# Patient Record
Sex: Female | Born: 1940
Health system: Southern US, Community
[De-identification: ages and names within clinical notes are randomized; demographics above are authoritative.]

## PROBLEM LIST (undated history)

## (undated) ENCOUNTER — Emergency Department (HOSPITAL_COMMUNITY)

## (undated) DIAGNOSIS — I10 Essential (primary) hypertension: Secondary | ICD-10-CM

## (undated) DIAGNOSIS — E1169 Type 2 diabetes mellitus with other specified complication: Secondary | ICD-10-CM

## (undated) DIAGNOSIS — F411 Generalized anxiety disorder: Secondary | ICD-10-CM

## (undated) DIAGNOSIS — K219 Gastro-esophageal reflux disease without esophagitis: Secondary | ICD-10-CM

## (undated) DIAGNOSIS — M1990 Unspecified osteoarthritis, unspecified site: Secondary | ICD-10-CM

## (undated) DIAGNOSIS — E785 Hyperlipidemia, unspecified: Secondary | ICD-10-CM

## (undated) DIAGNOSIS — E119 Type 2 diabetes mellitus without complications: Secondary | ICD-10-CM

## (undated) HISTORY — PX: TOTAL ABDOMINAL HYSTERECTOMY W/ BILATERAL SALPINGOOPHORECTOMY: SHX83

## (undated) HISTORY — DX: Generalized anxiety disorder: F41.1

## (undated) HISTORY — DX: Unspecified osteoarthritis, unspecified site: M19.90

## (undated) HISTORY — PX: APPENDECTOMY: SHX54

## (undated) HISTORY — DX: Type 2 diabetes mellitus with other specified complication: E11.69

## (undated) HISTORY — DX: Hyperlipidemia, unspecified: E78.5

## (undated) HISTORY — DX: Essential (primary) hypertension: I10

## (undated) HISTORY — PX: ABDOMINAL HYSTERECTOMY: SHX81

## (undated) HISTORY — DX: Gastro-esophageal reflux disease without esophagitis: K21.9

---

## 2009-05-27 ENCOUNTER — Inpatient Hospital Stay (HOSPITAL_COMMUNITY): Admission: AD | Admit: 2009-05-27 | Discharge: 2009-05-29 | Payer: Self-pay | Admitting: Orthopedic Surgery

## 2009-05-27 IMAGING — RF DG ANKLE 2V *R*
1 series · 4 of 4 positions shown · non-contrast
Comparison: None.

CLINICAL DATA: Right ankle fracture

RIGHT ANKLE - 2 VIEW

[Series 1: run · 4 of 4 slices shown]
[im 1/4]
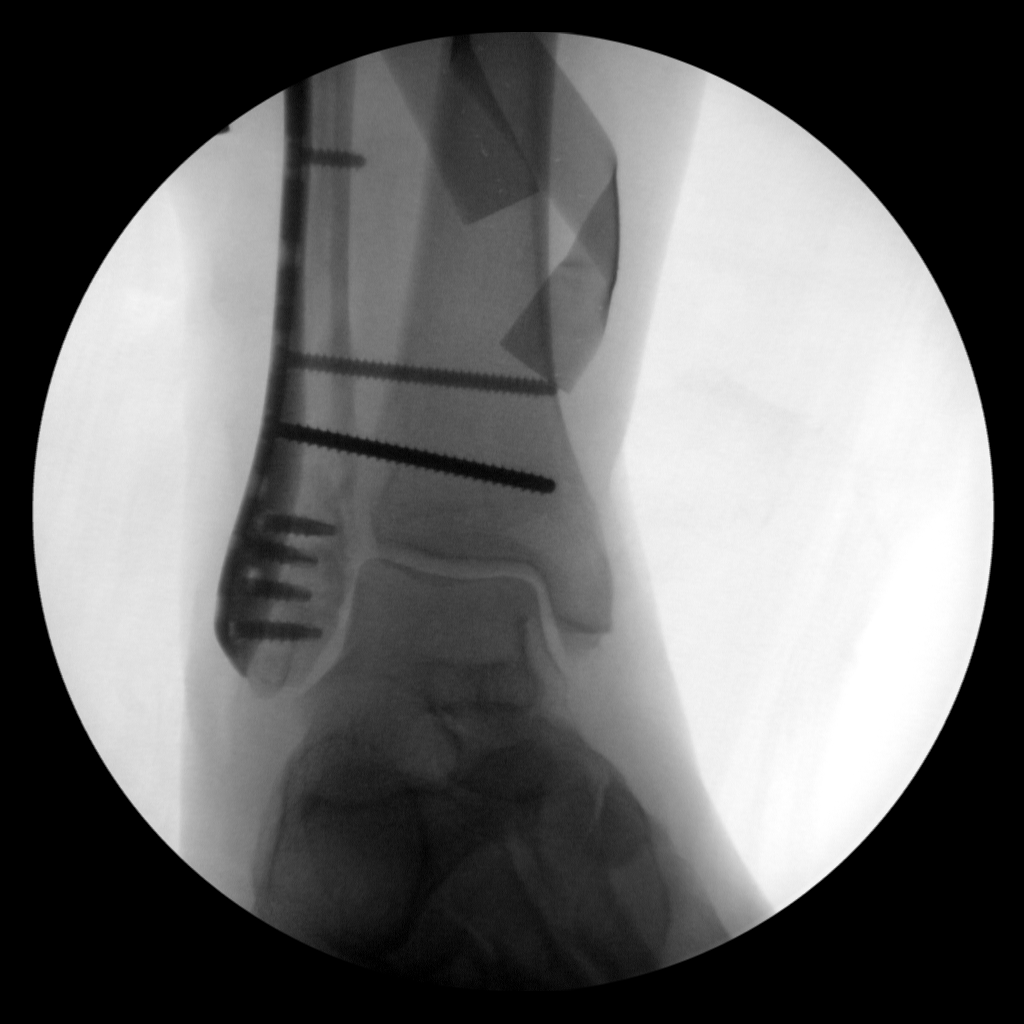
[im 2/4]
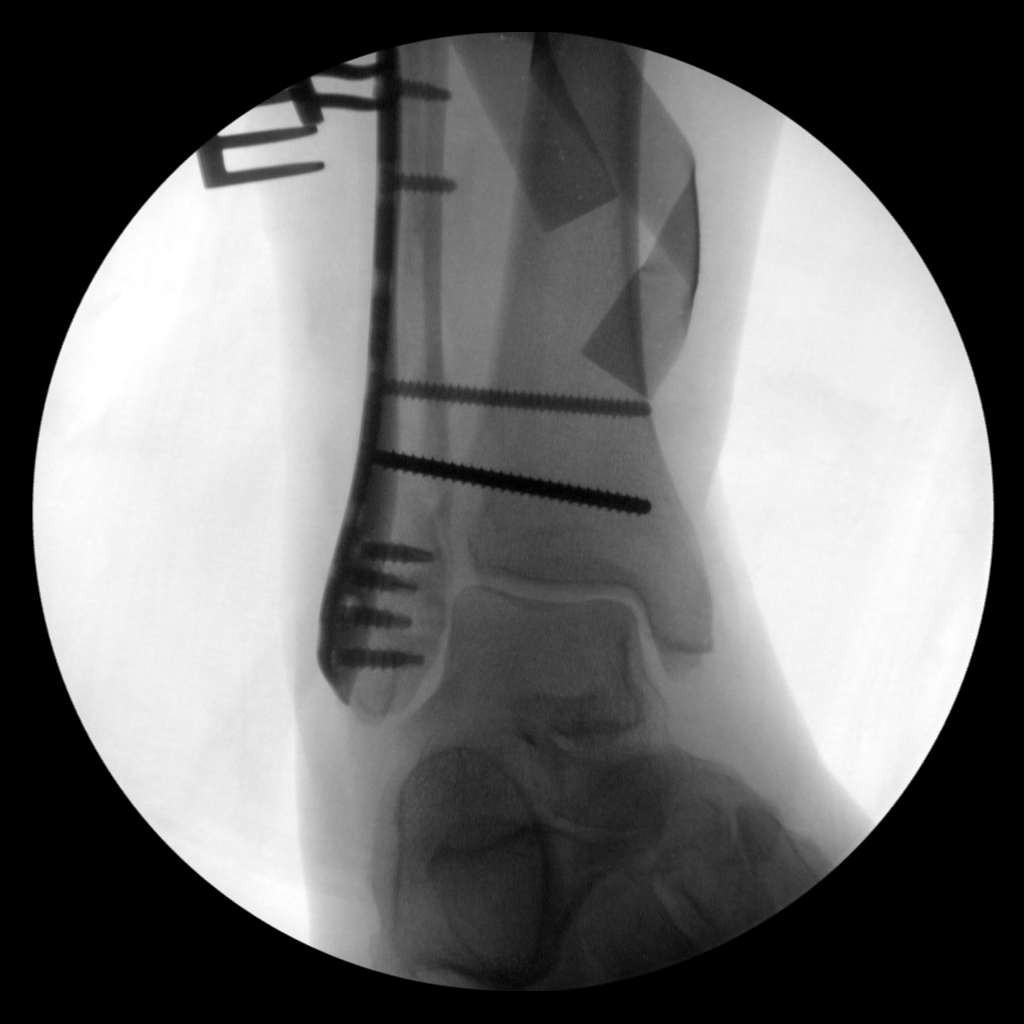
[im 3/4]
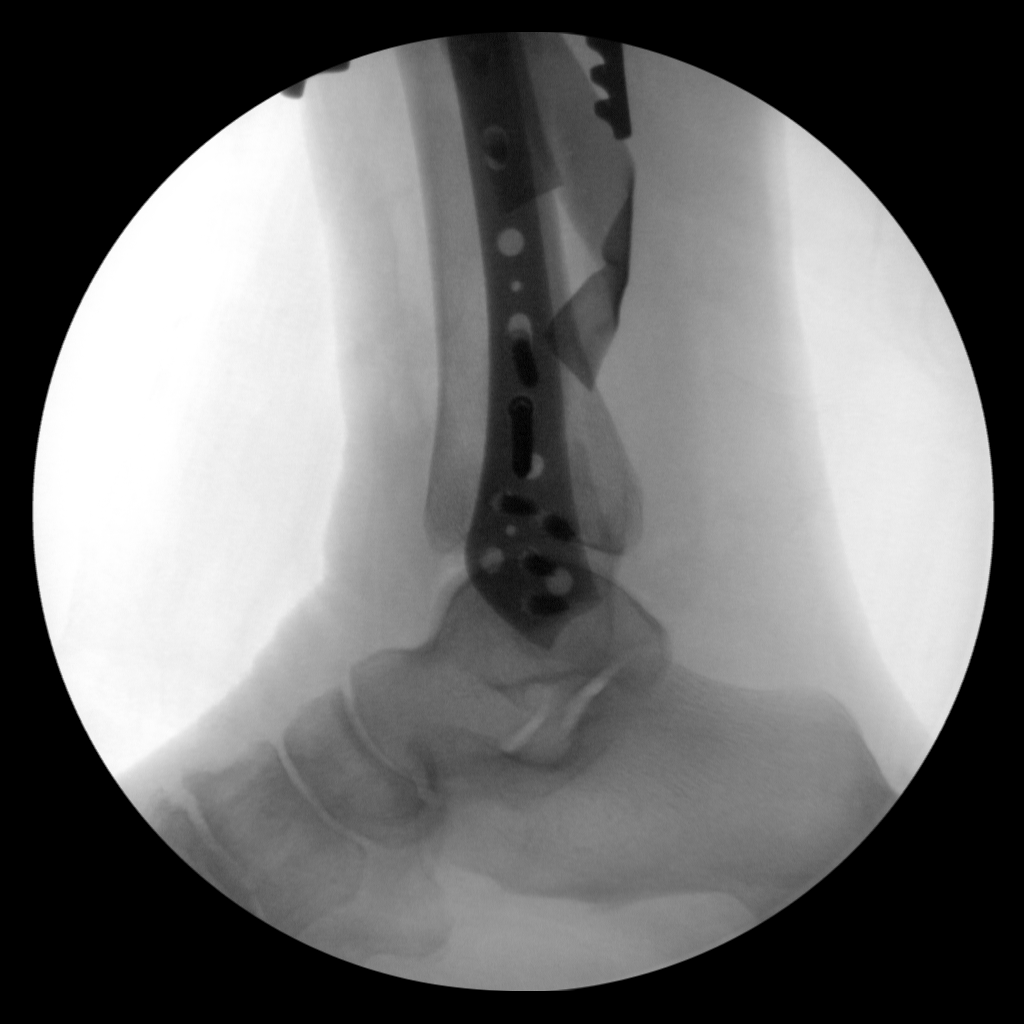
[im 4/4]
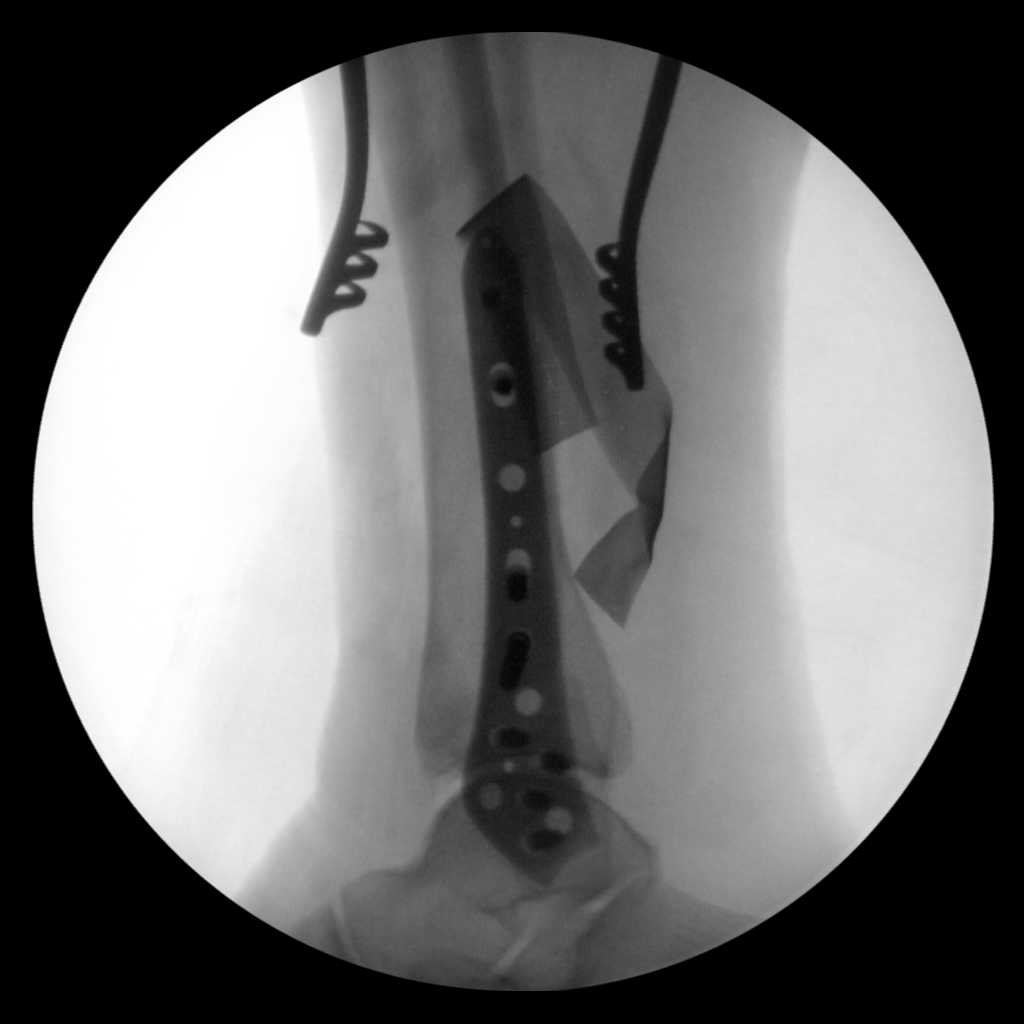

[4 of 4 positions shown; findings below may reference images not displayed]

FINDINGS: Four intraoperative spot fluoro images are submitted
after the procedure.  The patient has had lateral plate and screw
fixation of the distal fibula with two screws traversing the
syndesmosis.  Alignment the ankle mortise appears preserved.
Surgical drain and soft tissue retractors are noted.
IMPRESSION: Intraoperative evaluation during ORIF for ankle fracture.

## 2010-08-31 LAB — URINALYSIS, ROUTINE W REFLEX MICROSCOPIC
Bilirubin Urine: NEGATIVE
Ketones, ur: NEGATIVE mg/dL
Nitrite: NEGATIVE
Protein, ur: NEGATIVE mg/dL

## 2010-08-31 LAB — COMPREHENSIVE METABOLIC PANEL
Albumin: 4 g/dL (ref 3.5–5.2)
Alkaline Phosphatase: 63 U/L (ref 39–117)
BUN: 22 mg/dL (ref 6–23)
Chloride: 104 mEq/L (ref 96–112)
Creatinine, Ser: 0.56 mg/dL (ref 0.4–1.2)
Glucose, Bld: 131 mg/dL — ABNORMAL HIGH (ref 70–99)
Total Bilirubin: 0.4 mg/dL (ref 0.3–1.2)

## 2010-08-31 LAB — URINE MICROSCOPIC-ADD ON

## 2010-08-31 LAB — PROTIME-INR
INR: 1.13 (ref 0.00–1.49)
Prothrombin Time: 14.4 seconds (ref 11.6–15.2)

## 2010-08-31 LAB — CBC
HCT: 39.8 % (ref 36.0–46.0)
Hemoglobin: 13.3 g/dL (ref 12.0–15.0)
MCHC: 33.5 g/dL (ref 30.0–36.0)
RBC: 4.25 MIL/uL (ref 3.87–5.11)
RDW: 14 % (ref 11.5–15.5)

## 2010-08-31 LAB — DIFFERENTIAL
Basophils Absolute: 0 10*3/uL (ref 0.0–0.1)
Basophils Relative: 0 % (ref 0–1)
Eosinophils Absolute: 0.2 10*3/uL (ref 0.0–0.7)
Monocytes Relative: 7 % (ref 3–12)
Neutro Abs: 4.9 10*3/uL (ref 1.7–7.7)
Neutrophils Relative %: 58 % (ref 43–77)

## 2010-08-31 LAB — APTT: aPTT: 31 seconds (ref 24–37)

## 2015-06-03 DIAGNOSIS — H40003 Preglaucoma, unspecified, bilateral: Secondary | ICD-10-CM | POA: Diagnosis not present

## 2015-06-03 DIAGNOSIS — E119 Type 2 diabetes mellitus without complications: Secondary | ICD-10-CM | POA: Diagnosis not present

## 2015-06-03 DIAGNOSIS — H2513 Age-related nuclear cataract, bilateral: Secondary | ICD-10-CM | POA: Diagnosis not present

## 2015-06-13 DIAGNOSIS — E1165 Type 2 diabetes mellitus with hyperglycemia: Secondary | ICD-10-CM | POA: Diagnosis not present

## 2015-07-04 DIAGNOSIS — E1165 Type 2 diabetes mellitus with hyperglycemia: Secondary | ICD-10-CM | POA: Diagnosis not present

## 2015-07-08 DIAGNOSIS — Z1231 Encounter for screening mammogram for malignant neoplasm of breast: Secondary | ICD-10-CM | POA: Diagnosis not present

## 2015-07-10 DIAGNOSIS — E782 Mixed hyperlipidemia: Secondary | ICD-10-CM | POA: Diagnosis not present

## 2015-07-10 DIAGNOSIS — Z79899 Other long term (current) drug therapy: Secondary | ICD-10-CM | POA: Diagnosis not present

## 2015-07-16 DIAGNOSIS — Z1389 Encounter for screening for other disorder: Secondary | ICD-10-CM | POA: Diagnosis not present

## 2015-07-16 DIAGNOSIS — E782 Mixed hyperlipidemia: Secondary | ICD-10-CM | POA: Diagnosis not present

## 2015-07-16 DIAGNOSIS — I10 Essential (primary) hypertension: Secondary | ICD-10-CM | POA: Diagnosis not present

## 2015-07-16 DIAGNOSIS — E1165 Type 2 diabetes mellitus with hyperglycemia: Secondary | ICD-10-CM | POA: Diagnosis not present

## 2015-08-05 DIAGNOSIS — R52 Pain, unspecified: Secondary | ICD-10-CM | POA: Diagnosis not present

## 2015-08-05 DIAGNOSIS — Z1389 Encounter for screening for other disorder: Secondary | ICD-10-CM | POA: Diagnosis not present

## 2015-08-05 DIAGNOSIS — S0093XA Contusion of unspecified part of head, initial encounter: Secondary | ICD-10-CM | POA: Diagnosis not present

## 2015-08-11 DIAGNOSIS — W19XXXA Unspecified fall, initial encounter: Secondary | ICD-10-CM | POA: Diagnosis not present

## 2015-08-11 DIAGNOSIS — R42 Dizziness and giddiness: Secondary | ICD-10-CM | POA: Diagnosis not present

## 2015-08-11 DIAGNOSIS — R220 Localized swelling, mass and lump, head: Secondary | ICD-10-CM | POA: Diagnosis not present

## 2015-08-11 DIAGNOSIS — S0093XA Contusion of unspecified part of head, initial encounter: Secondary | ICD-10-CM | POA: Diagnosis not present

## 2015-12-09 DIAGNOSIS — H40003 Preglaucoma, unspecified, bilateral: Secondary | ICD-10-CM | POA: Diagnosis not present

## 2015-12-09 DIAGNOSIS — H2513 Age-related nuclear cataract, bilateral: Secondary | ICD-10-CM | POA: Diagnosis not present

## 2015-12-09 DIAGNOSIS — E119 Type 2 diabetes mellitus without complications: Secondary | ICD-10-CM | POA: Diagnosis not present

## 2015-12-16 DIAGNOSIS — C44519 Basal cell carcinoma of skin of other part of trunk: Secondary | ICD-10-CM | POA: Diagnosis not present

## 2015-12-16 DIAGNOSIS — E1165 Type 2 diabetes mellitus with hyperglycemia: Secondary | ICD-10-CM | POA: Diagnosis not present

## 2015-12-16 DIAGNOSIS — D485 Neoplasm of uncertain behavior of skin: Secondary | ICD-10-CM | POA: Diagnosis not present

## 2015-12-16 DIAGNOSIS — L820 Inflamed seborrheic keratosis: Secondary | ICD-10-CM | POA: Diagnosis not present

## 2015-12-17 DIAGNOSIS — F411 Generalized anxiety disorder: Secondary | ICD-10-CM | POA: Diagnosis not present

## 2015-12-17 DIAGNOSIS — Z1389 Encounter for screening for other disorder: Secondary | ICD-10-CM | POA: Diagnosis not present

## 2015-12-17 DIAGNOSIS — E782 Mixed hyperlipidemia: Secondary | ICD-10-CM | POA: Diagnosis not present

## 2015-12-17 DIAGNOSIS — I10 Essential (primary) hypertension: Secondary | ICD-10-CM | POA: Diagnosis not present

## 2015-12-17 DIAGNOSIS — E119 Type 2 diabetes mellitus without complications: Secondary | ICD-10-CM | POA: Diagnosis not present

## 2016-02-25 DIAGNOSIS — F411 Generalized anxiety disorder: Secondary | ICD-10-CM | POA: Diagnosis not present

## 2016-02-25 DIAGNOSIS — E782 Mixed hyperlipidemia: Secondary | ICD-10-CM | POA: Diagnosis not present

## 2016-02-25 DIAGNOSIS — I10 Essential (primary) hypertension: Secondary | ICD-10-CM | POA: Diagnosis not present

## 2016-02-25 DIAGNOSIS — Z0000 Encounter for general adult medical examination without abnormal findings: Secondary | ICD-10-CM | POA: Diagnosis not present

## 2016-02-25 DIAGNOSIS — E1165 Type 2 diabetes mellitus with hyperglycemia: Secondary | ICD-10-CM | POA: Diagnosis not present

## 2016-02-25 DIAGNOSIS — Z9181 History of falling: Secondary | ICD-10-CM | POA: Diagnosis not present

## 2016-02-25 DIAGNOSIS — Z23 Encounter for immunization: Secondary | ICD-10-CM | POA: Diagnosis not present

## 2016-03-30 DIAGNOSIS — R300 Dysuria: Secondary | ICD-10-CM | POA: Diagnosis not present

## 2016-06-07 DIAGNOSIS — E119 Type 2 diabetes mellitus without complications: Secondary | ICD-10-CM | POA: Diagnosis not present

## 2016-06-07 DIAGNOSIS — H2513 Age-related nuclear cataract, bilateral: Secondary | ICD-10-CM | POA: Diagnosis not present

## 2016-07-01 DIAGNOSIS — C44529 Squamous cell carcinoma of skin of other part of trunk: Secondary | ICD-10-CM | POA: Diagnosis not present

## 2016-07-01 DIAGNOSIS — L570 Actinic keratosis: Secondary | ICD-10-CM | POA: Diagnosis not present

## 2016-07-16 DIAGNOSIS — Z1231 Encounter for screening mammogram for malignant neoplasm of breast: Secondary | ICD-10-CM | POA: Diagnosis not present

## 2016-08-19 DIAGNOSIS — E782 Mixed hyperlipidemia: Secondary | ICD-10-CM | POA: Diagnosis not present

## 2016-08-19 DIAGNOSIS — Z6830 Body mass index (BMI) 30.0-30.9, adult: Secondary | ICD-10-CM | POA: Diagnosis not present

## 2016-08-19 DIAGNOSIS — Z79899 Other long term (current) drug therapy: Secondary | ICD-10-CM | POA: Diagnosis not present

## 2016-08-19 DIAGNOSIS — I10 Essential (primary) hypertension: Secondary | ICD-10-CM | POA: Diagnosis not present

## 2016-08-19 DIAGNOSIS — E1165 Type 2 diabetes mellitus with hyperglycemia: Secondary | ICD-10-CM | POA: Diagnosis not present

## 2016-12-14 DIAGNOSIS — C44519 Basal cell carcinoma of skin of other part of trunk: Secondary | ICD-10-CM | POA: Diagnosis not present

## 2016-12-14 DIAGNOSIS — L820 Inflamed seborrheic keratosis: Secondary | ICD-10-CM | POA: Diagnosis not present

## 2016-12-14 DIAGNOSIS — D225 Melanocytic nevi of trunk: Secondary | ICD-10-CM | POA: Diagnosis not present

## 2017-03-01 DIAGNOSIS — Z9181 History of falling: Secondary | ICD-10-CM | POA: Diagnosis not present

## 2017-03-01 DIAGNOSIS — Z79899 Other long term (current) drug therapy: Secondary | ICD-10-CM | POA: Diagnosis not present

## 2017-03-01 DIAGNOSIS — I10 Essential (primary) hypertension: Secondary | ICD-10-CM | POA: Diagnosis not present

## 2017-03-01 DIAGNOSIS — E782 Mixed hyperlipidemia: Secondary | ICD-10-CM | POA: Diagnosis not present

## 2017-03-01 DIAGNOSIS — F411 Generalized anxiety disorder: Secondary | ICD-10-CM | POA: Diagnosis not present

## 2017-03-01 DIAGNOSIS — E119 Type 2 diabetes mellitus without complications: Secondary | ICD-10-CM | POA: Diagnosis not present

## 2017-03-01 DIAGNOSIS — Z1331 Encounter for screening for depression: Secondary | ICD-10-CM | POA: Diagnosis not present

## 2017-03-01 DIAGNOSIS — M159 Polyosteoarthritis, unspecified: Secondary | ICD-10-CM | POA: Diagnosis not present

## 2017-03-08 DIAGNOSIS — Z23 Encounter for immunization: Secondary | ICD-10-CM | POA: Diagnosis not present

## 2017-03-16 DIAGNOSIS — L720 Epidermal cyst: Secondary | ICD-10-CM | POA: Diagnosis not present

## 2017-06-07 DIAGNOSIS — H40003 Preglaucoma, unspecified, bilateral: Secondary | ICD-10-CM | POA: Diagnosis not present

## 2017-06-07 DIAGNOSIS — E119 Type 2 diabetes mellitus without complications: Secondary | ICD-10-CM | POA: Diagnosis not present

## 2017-06-07 DIAGNOSIS — H2513 Age-related nuclear cataract, bilateral: Secondary | ICD-10-CM | POA: Diagnosis not present

## 2017-08-08 DIAGNOSIS — E119 Type 2 diabetes mellitus without complications: Secondary | ICD-10-CM | POA: Diagnosis not present

## 2017-08-08 DIAGNOSIS — S29011A Strain of muscle and tendon of front wall of thorax, initial encounter: Secondary | ICD-10-CM | POA: Diagnosis not present

## 2017-08-08 DIAGNOSIS — Z6830 Body mass index (BMI) 30.0-30.9, adult: Secondary | ICD-10-CM | POA: Diagnosis not present

## 2017-08-08 DIAGNOSIS — R300 Dysuria: Secondary | ICD-10-CM | POA: Diagnosis not present

## 2017-08-08 DIAGNOSIS — I10 Essential (primary) hypertension: Secondary | ICD-10-CM | POA: Diagnosis not present

## 2017-08-08 DIAGNOSIS — E782 Mixed hyperlipidemia: Secondary | ICD-10-CM | POA: Diagnosis not present

## 2017-08-09 DIAGNOSIS — E119 Type 2 diabetes mellitus without complications: Secondary | ICD-10-CM | POA: Diagnosis not present

## 2017-09-01 DIAGNOSIS — Z1231 Encounter for screening mammogram for malignant neoplasm of breast: Secondary | ICD-10-CM | POA: Diagnosis not present

## 2017-09-15 DIAGNOSIS — R928 Other abnormal and inconclusive findings on diagnostic imaging of breast: Secondary | ICD-10-CM | POA: Diagnosis not present

## 2017-09-22 DIAGNOSIS — L820 Inflamed seborrheic keratosis: Secondary | ICD-10-CM | POA: Diagnosis not present

## 2017-12-06 DIAGNOSIS — L570 Actinic keratosis: Secondary | ICD-10-CM | POA: Diagnosis not present

## 2017-12-06 DIAGNOSIS — B354 Tinea corporis: Secondary | ICD-10-CM | POA: Diagnosis not present

## 2018-01-26 DIAGNOSIS — K219 Gastro-esophageal reflux disease without esophagitis: Secondary | ICD-10-CM | POA: Diagnosis not present

## 2018-01-26 DIAGNOSIS — E119 Type 2 diabetes mellitus without complications: Secondary | ICD-10-CM | POA: Diagnosis not present

## 2018-01-26 DIAGNOSIS — E782 Mixed hyperlipidemia: Secondary | ICD-10-CM | POA: Diagnosis not present

## 2018-02-27 DIAGNOSIS — E119 Type 2 diabetes mellitus without complications: Secondary | ICD-10-CM | POA: Diagnosis not present

## 2018-02-27 DIAGNOSIS — K219 Gastro-esophageal reflux disease without esophagitis: Secondary | ICD-10-CM | POA: Diagnosis not present

## 2018-02-27 DIAGNOSIS — E782 Mixed hyperlipidemia: Secondary | ICD-10-CM | POA: Diagnosis not present

## 2018-02-27 DIAGNOSIS — I10 Essential (primary) hypertension: Secondary | ICD-10-CM | POA: Diagnosis not present

## 2018-03-03 DIAGNOSIS — Z0000 Encounter for general adult medical examination without abnormal findings: Secondary | ICD-10-CM | POA: Diagnosis not present

## 2018-03-03 DIAGNOSIS — I10 Essential (primary) hypertension: Secondary | ICD-10-CM | POA: Diagnosis not present

## 2018-03-03 DIAGNOSIS — Z6830 Body mass index (BMI) 30.0-30.9, adult: Secondary | ICD-10-CM | POA: Diagnosis not present

## 2018-03-03 DIAGNOSIS — Z1339 Encounter for screening examination for other mental health and behavioral disorders: Secondary | ICD-10-CM | POA: Diagnosis not present

## 2018-03-03 DIAGNOSIS — Z23 Encounter for immunization: Secondary | ICD-10-CM | POA: Diagnosis not present

## 2018-03-03 DIAGNOSIS — Z79899 Other long term (current) drug therapy: Secondary | ICD-10-CM | POA: Diagnosis not present

## 2018-03-03 DIAGNOSIS — E119 Type 2 diabetes mellitus without complications: Secondary | ICD-10-CM | POA: Diagnosis not present

## 2018-03-03 DIAGNOSIS — R51 Headache: Secondary | ICD-10-CM | POA: Diagnosis not present

## 2018-03-03 DIAGNOSIS — Z1331 Encounter for screening for depression: Secondary | ICD-10-CM | POA: Diagnosis not present

## 2018-03-03 DIAGNOSIS — R413 Other amnesia: Secondary | ICD-10-CM | POA: Diagnosis not present

## 2018-03-03 DIAGNOSIS — E782 Mixed hyperlipidemia: Secondary | ICD-10-CM | POA: Diagnosis not present

## 2018-03-14 DIAGNOSIS — Z6831 Body mass index (BMI) 31.0-31.9, adult: Secondary | ICD-10-CM | POA: Diagnosis not present

## 2018-03-14 DIAGNOSIS — R300 Dysuria: Secondary | ICD-10-CM | POA: Diagnosis not present

## 2018-03-14 DIAGNOSIS — N760 Acute vaginitis: Secondary | ICD-10-CM | POA: Diagnosis not present

## 2018-03-30 DIAGNOSIS — K219 Gastro-esophageal reflux disease without esophagitis: Secondary | ICD-10-CM | POA: Diagnosis not present

## 2018-03-30 DIAGNOSIS — E119 Type 2 diabetes mellitus without complications: Secondary | ICD-10-CM | POA: Diagnosis not present

## 2018-04-10 DIAGNOSIS — L570 Actinic keratosis: Secondary | ICD-10-CM | POA: Diagnosis not present

## 2018-04-10 DIAGNOSIS — L578 Other skin changes due to chronic exposure to nonionizing radiation: Secondary | ICD-10-CM | POA: Diagnosis not present

## 2018-04-10 DIAGNOSIS — L820 Inflamed seborrheic keratosis: Secondary | ICD-10-CM | POA: Diagnosis not present

## 2018-04-10 DIAGNOSIS — L821 Other seborrheic keratosis: Secondary | ICD-10-CM | POA: Diagnosis not present

## 2018-04-11 DIAGNOSIS — R079 Chest pain, unspecified: Secondary | ICD-10-CM | POA: Diagnosis not present

## 2018-04-11 DIAGNOSIS — E119 Type 2 diabetes mellitus without complications: Secondary | ICD-10-CM | POA: Insufficient documentation

## 2018-04-18 DIAGNOSIS — R079 Chest pain, unspecified: Secondary | ICD-10-CM | POA: Diagnosis not present

## 2018-05-12 DIAGNOSIS — L578 Other skin changes due to chronic exposure to nonionizing radiation: Secondary | ICD-10-CM | POA: Diagnosis not present

## 2018-05-12 DIAGNOSIS — L821 Other seborrheic keratosis: Secondary | ICD-10-CM | POA: Diagnosis not present

## 2018-05-12 DIAGNOSIS — L570 Actinic keratosis: Secondary | ICD-10-CM | POA: Diagnosis not present

## 2018-05-12 DIAGNOSIS — L820 Inflamed seborrheic keratosis: Secondary | ICD-10-CM | POA: Diagnosis not present

## 2018-05-30 DIAGNOSIS — E119 Type 2 diabetes mellitus without complications: Secondary | ICD-10-CM | POA: Diagnosis not present

## 2018-05-30 DIAGNOSIS — I10 Essential (primary) hypertension: Secondary | ICD-10-CM | POA: Diagnosis not present

## 2018-06-13 DIAGNOSIS — I10 Essential (primary) hypertension: Secondary | ICD-10-CM | POA: Diagnosis not present

## 2018-06-13 DIAGNOSIS — Z79899 Other long term (current) drug therapy: Secondary | ICD-10-CM | POA: Diagnosis not present

## 2018-06-13 DIAGNOSIS — R209 Unspecified disturbances of skin sensation: Secondary | ICD-10-CM | POA: Diagnosis not present

## 2018-06-13 DIAGNOSIS — R51 Headache: Secondary | ICD-10-CM | POA: Diagnosis not present

## 2018-06-13 DIAGNOSIS — E782 Mixed hyperlipidemia: Secondary | ICD-10-CM | POA: Diagnosis not present

## 2018-06-13 DIAGNOSIS — M159 Polyosteoarthritis, unspecified: Secondary | ICD-10-CM | POA: Diagnosis not present

## 2018-06-13 DIAGNOSIS — E119 Type 2 diabetes mellitus without complications: Secondary | ICD-10-CM | POA: Diagnosis not present

## 2018-06-13 DIAGNOSIS — Z6830 Body mass index (BMI) 30.0-30.9, adult: Secondary | ICD-10-CM | POA: Diagnosis not present

## 2018-06-20 DIAGNOSIS — R51 Headache: Secondary | ICD-10-CM | POA: Diagnosis not present

## 2018-06-20 DIAGNOSIS — I6523 Occlusion and stenosis of bilateral carotid arteries: Secondary | ICD-10-CM | POA: Diagnosis not present

## 2018-07-29 DIAGNOSIS — I10 Essential (primary) hypertension: Secondary | ICD-10-CM | POA: Diagnosis not present

## 2018-07-29 DIAGNOSIS — E119 Type 2 diabetes mellitus without complications: Secondary | ICD-10-CM | POA: Diagnosis not present

## 2018-08-29 DIAGNOSIS — E119 Type 2 diabetes mellitus without complications: Secondary | ICD-10-CM | POA: Diagnosis not present

## 2018-08-29 DIAGNOSIS — I10 Essential (primary) hypertension: Secondary | ICD-10-CM | POA: Diagnosis not present

## 2018-11-28 DIAGNOSIS — E119 Type 2 diabetes mellitus without complications: Secondary | ICD-10-CM | POA: Diagnosis not present

## 2018-11-28 DIAGNOSIS — E782 Mixed hyperlipidemia: Secondary | ICD-10-CM | POA: Diagnosis not present

## 2018-12-29 DIAGNOSIS — E119 Type 2 diabetes mellitus without complications: Secondary | ICD-10-CM | POA: Diagnosis not present

## 2018-12-29 DIAGNOSIS — I10 Essential (primary) hypertension: Secondary | ICD-10-CM | POA: Diagnosis not present

## 2019-01-11 DIAGNOSIS — E559 Vitamin D deficiency, unspecified: Secondary | ICD-10-CM | POA: Diagnosis not present

## 2019-01-11 DIAGNOSIS — Z79899 Other long term (current) drug therapy: Secondary | ICD-10-CM | POA: Diagnosis not present

## 2019-01-11 DIAGNOSIS — E119 Type 2 diabetes mellitus without complications: Secondary | ICD-10-CM | POA: Diagnosis not present

## 2019-01-11 DIAGNOSIS — E538 Deficiency of other specified B group vitamins: Secondary | ICD-10-CM | POA: Diagnosis not present

## 2019-01-11 DIAGNOSIS — E782 Mixed hyperlipidemia: Secondary | ICD-10-CM | POA: Diagnosis not present

## 2019-01-27 DIAGNOSIS — R05 Cough: Secondary | ICD-10-CM | POA: Diagnosis not present

## 2019-01-27 DIAGNOSIS — Z20828 Contact with and (suspected) exposure to other viral communicable diseases: Secondary | ICD-10-CM | POA: Diagnosis not present

## 2019-01-27 DIAGNOSIS — R51 Headache: Secondary | ICD-10-CM | POA: Diagnosis not present

## 2019-01-27 DIAGNOSIS — R070 Pain in throat: Secondary | ICD-10-CM | POA: Diagnosis not present

## 2019-01-29 DIAGNOSIS — I10 Essential (primary) hypertension: Secondary | ICD-10-CM | POA: Diagnosis not present

## 2019-01-29 DIAGNOSIS — R509 Fever, unspecified: Secondary | ICD-10-CM | POA: Diagnosis not present

## 2019-01-29 DIAGNOSIS — K1370 Unspecified lesions of oral mucosa: Secondary | ICD-10-CM | POA: Diagnosis not present

## 2019-01-29 DIAGNOSIS — K219 Gastro-esophageal reflux disease without esophagitis: Secondary | ICD-10-CM | POA: Diagnosis not present

## 2019-01-29 DIAGNOSIS — E782 Mixed hyperlipidemia: Secondary | ICD-10-CM | POA: Diagnosis not present

## 2019-01-30 DIAGNOSIS — J029 Acute pharyngitis, unspecified: Secondary | ICD-10-CM | POA: Diagnosis not present

## 2019-01-30 DIAGNOSIS — Z6830 Body mass index (BMI) 30.0-30.9, adult: Secondary | ICD-10-CM | POA: Diagnosis not present

## 2019-01-30 DIAGNOSIS — K1370 Unspecified lesions of oral mucosa: Secondary | ICD-10-CM | POA: Diagnosis not present

## 2019-02-28 DIAGNOSIS — E119 Type 2 diabetes mellitus without complications: Secondary | ICD-10-CM | POA: Diagnosis not present

## 2019-02-28 DIAGNOSIS — I10 Essential (primary) hypertension: Secondary | ICD-10-CM | POA: Diagnosis not present

## 2019-03-06 DIAGNOSIS — Z1331 Encounter for screening for depression: Secondary | ICD-10-CM | POA: Diagnosis not present

## 2019-03-06 DIAGNOSIS — I10 Essential (primary) hypertension: Secondary | ICD-10-CM | POA: Diagnosis not present

## 2019-03-06 DIAGNOSIS — F411 Generalized anxiety disorder: Secondary | ICD-10-CM | POA: Diagnosis not present

## 2019-03-06 DIAGNOSIS — R300 Dysuria: Secondary | ICD-10-CM | POA: Diagnosis not present

## 2019-03-06 DIAGNOSIS — Z23 Encounter for immunization: Secondary | ICD-10-CM | POA: Diagnosis not present

## 2019-03-06 DIAGNOSIS — M159 Polyosteoarthritis, unspecified: Secondary | ICD-10-CM | POA: Diagnosis not present

## 2019-03-06 DIAGNOSIS — Z6830 Body mass index (BMI) 30.0-30.9, adult: Secondary | ICD-10-CM | POA: Diagnosis not present

## 2019-03-06 DIAGNOSIS — E1169 Type 2 diabetes mellitus with other specified complication: Secondary | ICD-10-CM | POA: Diagnosis not present

## 2019-03-06 DIAGNOSIS — Z0000 Encounter for general adult medical examination without abnormal findings: Secondary | ICD-10-CM | POA: Diagnosis not present

## 2019-03-06 DIAGNOSIS — E782 Mixed hyperlipidemia: Secondary | ICD-10-CM | POA: Diagnosis not present

## 2019-03-06 DIAGNOSIS — E785 Hyperlipidemia, unspecified: Secondary | ICD-10-CM | POA: Diagnosis not present

## 2019-03-30 DIAGNOSIS — E782 Mixed hyperlipidemia: Secondary | ICD-10-CM | POA: Diagnosis not present

## 2019-03-30 DIAGNOSIS — I10 Essential (primary) hypertension: Secondary | ICD-10-CM | POA: Diagnosis not present

## 2019-04-25 ENCOUNTER — Other Ambulatory Visit: Payer: Self-pay

## 2019-04-30 DIAGNOSIS — I10 Essential (primary) hypertension: Secondary | ICD-10-CM | POA: Diagnosis not present

## 2019-04-30 DIAGNOSIS — E782 Mixed hyperlipidemia: Secondary | ICD-10-CM | POA: Diagnosis not present

## 2019-04-30 DIAGNOSIS — E119 Type 2 diabetes mellitus without complications: Secondary | ICD-10-CM | POA: Diagnosis not present

## 2019-05-02 DIAGNOSIS — H04123 Dry eye syndrome of bilateral lacrimal glands: Secondary | ICD-10-CM | POA: Diagnosis not present

## 2019-05-02 DIAGNOSIS — H25813 Combined forms of age-related cataract, bilateral: Secondary | ICD-10-CM | POA: Diagnosis not present

## 2019-05-02 DIAGNOSIS — E119 Type 2 diabetes mellitus without complications: Secondary | ICD-10-CM | POA: Diagnosis not present

## 2019-05-02 DIAGNOSIS — H524 Presbyopia: Secondary | ICD-10-CM | POA: Diagnosis not present

## 2019-05-31 DIAGNOSIS — I10 Essential (primary) hypertension: Secondary | ICD-10-CM | POA: Diagnosis not present

## 2019-05-31 DIAGNOSIS — E119 Type 2 diabetes mellitus without complications: Secondary | ICD-10-CM | POA: Diagnosis not present

## 2019-05-31 DIAGNOSIS — E782 Mixed hyperlipidemia: Secondary | ICD-10-CM | POA: Diagnosis not present

## 2019-06-30 DIAGNOSIS — E1169 Type 2 diabetes mellitus with other specified complication: Secondary | ICD-10-CM | POA: Diagnosis not present

## 2019-06-30 DIAGNOSIS — E782 Mixed hyperlipidemia: Secondary | ICD-10-CM | POA: Diagnosis not present

## 2019-06-30 DIAGNOSIS — I10 Essential (primary) hypertension: Secondary | ICD-10-CM | POA: Diagnosis not present

## 2019-07-26 DIAGNOSIS — L578 Other skin changes due to chronic exposure to nonionizing radiation: Secondary | ICD-10-CM | POA: Diagnosis not present

## 2019-07-26 DIAGNOSIS — L821 Other seborrheic keratosis: Secondary | ICD-10-CM | POA: Diagnosis not present

## 2019-07-26 DIAGNOSIS — L570 Actinic keratosis: Secondary | ICD-10-CM | POA: Diagnosis not present

## 2019-07-26 DIAGNOSIS — L820 Inflamed seborrheic keratosis: Secondary | ICD-10-CM | POA: Diagnosis not present

## 2019-07-29 DIAGNOSIS — I10 Essential (primary) hypertension: Secondary | ICD-10-CM | POA: Diagnosis not present

## 2019-07-29 DIAGNOSIS — E782 Mixed hyperlipidemia: Secondary | ICD-10-CM | POA: Diagnosis not present

## 2019-08-29 DIAGNOSIS — K219 Gastro-esophageal reflux disease without esophagitis: Secondary | ICD-10-CM | POA: Diagnosis not present

## 2019-08-29 DIAGNOSIS — I10 Essential (primary) hypertension: Secondary | ICD-10-CM | POA: Diagnosis not present

## 2019-09-28 DIAGNOSIS — E785 Hyperlipidemia, unspecified: Secondary | ICD-10-CM | POA: Diagnosis not present

## 2019-09-28 DIAGNOSIS — I10 Essential (primary) hypertension: Secondary | ICD-10-CM | POA: Diagnosis not present

## 2019-10-03 DIAGNOSIS — E1169 Type 2 diabetes mellitus with other specified complication: Secondary | ICD-10-CM | POA: Diagnosis not present

## 2019-10-10 DIAGNOSIS — E782 Mixed hyperlipidemia: Secondary | ICD-10-CM | POA: Diagnosis not present

## 2019-10-10 DIAGNOSIS — E1169 Type 2 diabetes mellitus with other specified complication: Secondary | ICD-10-CM | POA: Diagnosis not present

## 2019-10-10 DIAGNOSIS — E785 Hyperlipidemia, unspecified: Secondary | ICD-10-CM | POA: Diagnosis not present

## 2019-10-10 DIAGNOSIS — I10 Essential (primary) hypertension: Secondary | ICD-10-CM | POA: Diagnosis not present

## 2019-10-10 DIAGNOSIS — Z6830 Body mass index (BMI) 30.0-30.9, adult: Secondary | ICD-10-CM | POA: Diagnosis not present

## 2019-10-29 DIAGNOSIS — E1169 Type 2 diabetes mellitus with other specified complication: Secondary | ICD-10-CM | POA: Diagnosis not present

## 2019-10-29 DIAGNOSIS — K219 Gastro-esophageal reflux disease without esophagitis: Secondary | ICD-10-CM | POA: Diagnosis not present

## 2019-10-29 DIAGNOSIS — E782 Mixed hyperlipidemia: Secondary | ICD-10-CM | POA: Diagnosis not present

## 2019-10-29 DIAGNOSIS — I10 Essential (primary) hypertension: Secondary | ICD-10-CM | POA: Diagnosis not present

## 2019-11-28 DIAGNOSIS — I10 Essential (primary) hypertension: Secondary | ICD-10-CM | POA: Diagnosis not present

## 2019-11-28 DIAGNOSIS — E782 Mixed hyperlipidemia: Secondary | ICD-10-CM | POA: Diagnosis not present

## 2019-11-28 DIAGNOSIS — K219 Gastro-esophageal reflux disease without esophagitis: Secondary | ICD-10-CM | POA: Diagnosis not present

## 2019-12-29 DIAGNOSIS — E782 Mixed hyperlipidemia: Secondary | ICD-10-CM | POA: Diagnosis not present

## 2019-12-29 DIAGNOSIS — I10 Essential (primary) hypertension: Secondary | ICD-10-CM | POA: Diagnosis not present

## 2019-12-29 DIAGNOSIS — E1169 Type 2 diabetes mellitus with other specified complication: Secondary | ICD-10-CM | POA: Diagnosis not present

## 2019-12-29 DIAGNOSIS — K219 Gastro-esophageal reflux disease without esophagitis: Secondary | ICD-10-CM | POA: Diagnosis not present

## 2020-01-29 DIAGNOSIS — E782 Mixed hyperlipidemia: Secondary | ICD-10-CM | POA: Diagnosis not present

## 2020-01-29 DIAGNOSIS — K219 Gastro-esophageal reflux disease without esophagitis: Secondary | ICD-10-CM | POA: Diagnosis not present

## 2020-01-29 DIAGNOSIS — E1169 Type 2 diabetes mellitus with other specified complication: Secondary | ICD-10-CM | POA: Diagnosis not present

## 2020-01-29 DIAGNOSIS — I10 Essential (primary) hypertension: Secondary | ICD-10-CM | POA: Diagnosis not present

## 2020-02-28 DIAGNOSIS — K219 Gastro-esophageal reflux disease without esophagitis: Secondary | ICD-10-CM | POA: Diagnosis not present

## 2020-02-28 DIAGNOSIS — I10 Essential (primary) hypertension: Secondary | ICD-10-CM | POA: Diagnosis not present

## 2020-02-28 DIAGNOSIS — E782 Mixed hyperlipidemia: Secondary | ICD-10-CM | POA: Diagnosis not present

## 2020-02-28 DIAGNOSIS — E1169 Type 2 diabetes mellitus with other specified complication: Secondary | ICD-10-CM | POA: Diagnosis not present

## 2020-03-04 DIAGNOSIS — E782 Mixed hyperlipidemia: Secondary | ICD-10-CM | POA: Diagnosis not present

## 2020-03-04 DIAGNOSIS — Z79899 Other long term (current) drug therapy: Secondary | ICD-10-CM | POA: Diagnosis not present

## 2020-03-04 DIAGNOSIS — E1169 Type 2 diabetes mellitus with other specified complication: Secondary | ICD-10-CM | POA: Diagnosis not present

## 2020-03-11 DIAGNOSIS — M159 Polyosteoarthritis, unspecified: Secondary | ICD-10-CM | POA: Diagnosis not present

## 2020-03-11 DIAGNOSIS — I10 Essential (primary) hypertension: Secondary | ICD-10-CM | POA: Diagnosis not present

## 2020-03-11 DIAGNOSIS — Z0000 Encounter for general adult medical examination without abnormal findings: Secondary | ICD-10-CM | POA: Diagnosis not present

## 2020-03-11 DIAGNOSIS — E1169 Type 2 diabetes mellitus with other specified complication: Secondary | ICD-10-CM | POA: Diagnosis not present

## 2020-03-11 DIAGNOSIS — Z6830 Body mass index (BMI) 30.0-30.9, adult: Secondary | ICD-10-CM | POA: Diagnosis not present

## 2020-03-11 DIAGNOSIS — E782 Mixed hyperlipidemia: Secondary | ICD-10-CM | POA: Diagnosis not present

## 2020-03-11 DIAGNOSIS — R300 Dysuria: Secondary | ICD-10-CM | POA: Diagnosis not present

## 2020-03-11 DIAGNOSIS — Z23 Encounter for immunization: Secondary | ICD-10-CM | POA: Diagnosis not present

## 2020-03-11 DIAGNOSIS — Z1331 Encounter for screening for depression: Secondary | ICD-10-CM | POA: Diagnosis not present

## 2020-03-11 DIAGNOSIS — E785 Hyperlipidemia, unspecified: Secondary | ICD-10-CM | POA: Diagnosis not present

## 2020-03-29 DIAGNOSIS — K219 Gastro-esophageal reflux disease without esophagitis: Secondary | ICD-10-CM | POA: Diagnosis not present

## 2020-03-29 DIAGNOSIS — I10 Essential (primary) hypertension: Secondary | ICD-10-CM | POA: Diagnosis not present

## 2020-03-29 DIAGNOSIS — E1169 Type 2 diabetes mellitus with other specified complication: Secondary | ICD-10-CM | POA: Diagnosis not present

## 2020-03-29 DIAGNOSIS — E782 Mixed hyperlipidemia: Secondary | ICD-10-CM | POA: Diagnosis not present

## 2020-04-08 DIAGNOSIS — R300 Dysuria: Secondary | ICD-10-CM | POA: Diagnosis not present

## 2020-04-29 DIAGNOSIS — E1169 Type 2 diabetes mellitus with other specified complication: Secondary | ICD-10-CM | POA: Diagnosis not present

## 2020-04-29 DIAGNOSIS — K219 Gastro-esophageal reflux disease without esophagitis: Secondary | ICD-10-CM | POA: Diagnosis not present

## 2020-04-29 DIAGNOSIS — E782 Mixed hyperlipidemia: Secondary | ICD-10-CM | POA: Diagnosis not present

## 2020-04-29 DIAGNOSIS — I10 Essential (primary) hypertension: Secondary | ICD-10-CM | POA: Diagnosis not present

## 2020-05-01 DIAGNOSIS — Z6830 Body mass index (BMI) 30.0-30.9, adult: Secondary | ICD-10-CM | POA: Diagnosis not present

## 2020-05-01 DIAGNOSIS — R300 Dysuria: Secondary | ICD-10-CM | POA: Diagnosis not present

## 2020-05-01 DIAGNOSIS — R002 Palpitations: Secondary | ICD-10-CM | POA: Diagnosis not present

## 2020-05-12 DIAGNOSIS — R3911 Hesitancy of micturition: Secondary | ICD-10-CM | POA: Diagnosis not present

## 2020-05-12 DIAGNOSIS — N952 Postmenopausal atrophic vaginitis: Secondary | ICD-10-CM | POA: Diagnosis not present

## 2020-05-12 DIAGNOSIS — R339 Retention of urine, unspecified: Secondary | ICD-10-CM | POA: Diagnosis not present

## 2020-05-12 DIAGNOSIS — N390 Urinary tract infection, site not specified: Secondary | ICD-10-CM | POA: Diagnosis not present

## 2020-05-12 DIAGNOSIS — Z79899 Other long term (current) drug therapy: Secondary | ICD-10-CM | POA: Diagnosis not present

## 2020-05-29 DIAGNOSIS — E782 Mixed hyperlipidemia: Secondary | ICD-10-CM | POA: Diagnosis not present

## 2020-05-29 DIAGNOSIS — I10 Essential (primary) hypertension: Secondary | ICD-10-CM | POA: Diagnosis not present

## 2020-05-29 DIAGNOSIS — F411 Generalized anxiety disorder: Secondary | ICD-10-CM | POA: Diagnosis not present

## 2020-06-06 DIAGNOSIS — Z6830 Body mass index (BMI) 30.0-30.9, adult: Secondary | ICD-10-CM | POA: Diagnosis not present

## 2020-06-06 DIAGNOSIS — R002 Palpitations: Secondary | ICD-10-CM | POA: Diagnosis not present

## 2020-06-06 DIAGNOSIS — Z9181 History of falling: Secondary | ICD-10-CM | POA: Diagnosis not present

## 2020-06-10 DIAGNOSIS — F411 Generalized anxiety disorder: Secondary | ICD-10-CM | POA: Insufficient documentation

## 2020-06-10 DIAGNOSIS — K219 Gastro-esophageal reflux disease without esophagitis: Secondary | ICD-10-CM | POA: Insufficient documentation

## 2020-06-10 DIAGNOSIS — E785 Hyperlipidemia, unspecified: Secondary | ICD-10-CM

## 2020-06-10 DIAGNOSIS — E1169 Type 2 diabetes mellitus with other specified complication: Secondary | ICD-10-CM

## 2020-06-10 DIAGNOSIS — M1990 Unspecified osteoarthritis, unspecified site: Secondary | ICD-10-CM

## 2020-06-10 DIAGNOSIS — I10 Essential (primary) hypertension: Secondary | ICD-10-CM

## 2020-06-12 ENCOUNTER — Ambulatory Visit: Payer: PPO | Admitting: Cardiology

## 2020-06-12 ENCOUNTER — Encounter: Payer: Self-pay | Admitting: Cardiology

## 2020-06-12 ENCOUNTER — Ambulatory Visit (INDEPENDENT_AMBULATORY_CARE_PROVIDER_SITE_OTHER): Payer: PPO

## 2020-06-12 ENCOUNTER — Other Ambulatory Visit: Payer: Self-pay

## 2020-06-12 VITALS — BP 110/68 | HR 60 | Ht 59.0 in | Wt 155.6 lb

## 2020-06-12 DIAGNOSIS — R072 Precordial pain: Secondary | ICD-10-CM

## 2020-06-12 DIAGNOSIS — R0609 Other forms of dyspnea: Secondary | ICD-10-CM

## 2020-06-12 DIAGNOSIS — R002 Palpitations: Secondary | ICD-10-CM

## 2020-06-12 DIAGNOSIS — E669 Obesity, unspecified: Secondary | ICD-10-CM

## 2020-06-12 DIAGNOSIS — I10 Essential (primary) hypertension: Secondary | ICD-10-CM | POA: Diagnosis not present

## 2020-06-12 DIAGNOSIS — R0600 Dyspnea, unspecified: Secondary | ICD-10-CM | POA: Diagnosis not present

## 2020-06-12 DIAGNOSIS — E782 Mixed hyperlipidemia: Secondary | ICD-10-CM

## 2020-06-12 DIAGNOSIS — E119 Type 2 diabetes mellitus without complications: Secondary | ICD-10-CM

## 2020-06-12 NOTE — Patient Instructions (Addendum)
Medication Instructions:  No medication changes. *If you need a refill on your cardiac medications before your next appointment, please call your pharmacy*   Lab Work: Your physician recommends that you return for lab work in: 1 week prior to your CT scan. You can come Monday through Friday 8:30 am to 12:00 pm and 1:15 to 4:30. You do not need to make an appointment as the order has already been placed.   If you have labs (blood work) drawn today and your tests are completely normal, you will receive your results only by: Marland Kitchen MyChart Message (if you have MyChart) OR . A paper copy in the mail If you have any lab test that is abnormal or we need to change your treatment, we will call you to review the results.   Testing/Procedures: Your cardiac CT will be scheduled at:   [facility] [place], [place] [address] [phone]   If scheduled at [facility], please arrive at the [facility] main entrance of [facility] 30 minutes prior to test start time. Proceed to the [facility] Radiology Department (first floor) to check-in and test prep.  Please follow these instructions carefully (unless otherwise directed):  On the Night Before the Test: . Be sure to Drink plenty of water. . Do not consume any caffeinated/decaffeinated beverages or chocolate 12 hours prior to your test. . Do not take any antihistamines 12 hours prior to your test.  On the Day of the Test: . Drink plenty of water. Do not drink any water within one hour of the test. . Do not eat any food 4 hours prior to the test. . You may take your regular medications prior to the test.  . Take bisoprolol-hydrochlorothiazide  two hours prior to test. . FEMALES- please wear underwire-free bra if available      After the Test: . Drink plenty of water. . After receiving IV contrast, you may experience a mild flushed feeling. This is normal. . On occasion, you may experience a mild rash  up to 24 hours after the test. This is not dangerous. If this occurs, you can take Benadryl 25 mg and increase your fluid intake. . If you experience trouble breathing, this can be serious. If it is severe call 911 IMMEDIATELY. If it is mild, please call our office. . If you take any of these medications: Glipizide/Metformin, Avandament, Glucavance, please do not take 48 hours after completing test unless otherwise instructed.   Once we have confirmed authorization from your insurance company, we will call you to set up a date and time for your test. Based on how quickly your insurance processes prior authorizations requests, please allow up to 4 weeks to be contacted for scheduling your Cardiac CT appointment. Be advised that routine Cardiac CT appointments could be scheduled as many as 8 weeks after your provider has ordered it.  For non-scheduling related questions, please contact the cardiac imaging nurse navigator should you have any questions/concerns: Marchia Bond, Cardiac Imaging Nurse Navigator Burley Saver, Interim Cardiac Imaging Nurse [place] [facility] Direct Office Dial: [phone]   For scheduling needs, including cancellations and rescheduling, please call Vivien Rota at [phone].     WHY IS MY DOCTOR PRESCRIBING ZIO? The Zio system is proven and trusted by physicians to detect and diagnose irregular heart rhythms - and has been prescribed to hundreds of thousands of patients.  The FDA has cleared the Zio system to monitor for  many different kinds of irregular heart rhythms. In a study, physicians were able to reach a diagnosis 90% of the time with the Zio system1.  You can wear the Zio monitor - a small, discreet, comfortable patch - during your normal day-to-day activity, including while you sleep, shower, and exercise, while it records every single heartbeat for analysis.  1Barrett, P., et al. Comparison of 24 Hour Holter Monitoring Versus 14 Day  Novel Adhesive Patch Electrocardiographic Monitoring. [place], 2014.  ZIO VS. HOLTER MONITORING The Zio monitor can be comfortably worn for up to 14 days. Holter monitors can be worn for 24 to 48 hours, limiting the time to record any irregular heart rhythms you may have. Zio is able to capture data for the 51% of patients who have their first symptom-triggered arrhythmia after 48 hours.1  LIVE WITHOUT RESTRICTIONS The Zio ambulatory cardiac monitor is a small, unobtrusive, and water-resistant patch-you might even forget you're wearing it. The Zio monitor records and stores every beat of your heart, whether you're sleeping, working out, or showering. Wear the monitor for 7 days, remove 06/19/20.  Your physician has requested that you have an echocardiogram. Echocardiography is a painless test that uses sound waves to create images of your heart. It provides your doctor with information about the size and shape of your heart and how well your heart's chambers and valves are working. This procedure takes approximately one hour. There are no restrictions for this procedure.   Follow-Up: At [facility], you and your health needs are our priority.  As part of our continuing mission to provide you with exceptional heart care, we have created designated Provider Care Teams.  These Care Teams include your primary Cardiologist (physician) and Advanced Practice Providers (APPs -  Physician Assistants and Nurse Practitioners) who all work together to provide you with the care you need, when you need it.  We recommend signing up for the patient portal called "MyChart".  Sign up information is provided on this After Visit Summary.  MyChart is used to connect with patients for Virtual Visits (Telemedicine).  Patients are able to view lab/test results, encounter notes, upcoming appointments, etc.  Non-urgent messages can be sent to your provider as well.   To learn more about what you can do  with MyChart, go to [email or web address].    Your next appointment:   12 week(s)  The format for your next appointment:   In Person  Provider:   Berniece Salines, DO   Other Instructions Cardiac CT Angiogram A cardiac CT angiogram is a procedure to look at the heart and the area around the heart. It may be done to help find the cause of chest pains or other symptoms of heart disease. During this procedure, a substance called contrast dye is injected into the blood vessels in the area to be checked. A large X-ray machine, called a CT scanner, then takes detailed pictures of the heart and the surrounding area. The procedure is also sometimes called a coronary CT angiogram, coronary artery scanning, or CTA. A cardiac CT angiogram allows the health care provider to see how well blood is flowing to and from the heart. The health care provider will be able to see if there are any problems, such as:  Blockage or narrowing of the coronary arteries in the heart.  Fluid around the heart.  Signs of weakness or disease in the muscles, valves, and tissues of the heart. Tell a health care provider about:  Any  allergies you have. This is especially important if you have had a previous allergic reaction to contrast dye.  All medicines you are taking, including vitamins, herbs, eye drops, creams, and over-the-counter medicines.  Any blood disorders you have.  Any surgeries you have had.  Any medical conditions you have.  Whether you are pregnant or may be pregnant.  Any anxiety disorders, chronic pain, or other conditions you have that may increase your stress or prevent you from lying still. What are the risks? Generally, this is a safe procedure. However, problems may occur, including: 1. Bleeding. 2. Infection. 3. Allergic reactions to medicines or dyes. 4. Damage to other structures or organs. 5. Kidney damage from the contrast dye that is used. 6. Increased risk of cancer from radiation  exposure. This risk is low. Talk with your health care provider about: ? The risks and benefits of testing. ? How you can receive the lowest dose of radiation. What happens before the procedure? 1. Wear comfortable clothing and remove any jewelry, glasses, dentures, and hearing aids. 2. Follow instructions from your health care provider about eating and drinking. This may include: ? For 12 hours before the procedure - avoid caffeine. This includes tea, coffee, soda, energy drinks, and diet pills. Drink plenty of water or other fluids that do not have caffeine in them. Being well hydrated can prevent complications. ? For 4-6 hours before the procedure - stop eating and drinking. The contrast dye can cause nausea, but this is less likely if your stomach is empty. 3. Ask your health care provider about changing or stopping your regular medicines. This is especially important if you are taking diabetes medicines, blood thinners, or medicines to treat problems with erections (erectile dysfunction). What happens during the procedure?  1. Hair on your chest may need to be removed so that small sticky patches called electrodes can be placed on your chest. These will transmit information that helps to monitor your heart during the procedure. 2. An IV will be inserted into one of your veins. 3. You might be given a medicine to control your heart rate during the procedure. This will help to ensure that good images are obtained. 4. You will be asked to lie on an exam table. This table will slide in and out of the CT machine during the procedure. 5. Contrast dye will be injected into the IV. You might feel warm, or you may get a metallic taste in your mouth. 6. You will be given a medicine called nitroglycerin. This will relax or dilate the arteries in your heart. 7. The table that you are lying on will move into the CT machine tunnel for the scan. 8. The person running the machine will give you instructions  while the scans are being done. You may be asked to: ? Keep your arms above your head. ? Hold your breath. ? Stay very still, even if the table is moving. 9. When the scanning is complete, you will be moved out of the machine. 10. The IV will be removed. The procedure may vary among health care providers and hospitals. What can I expect after the procedure? After your procedure, it is common to have:  A metallic taste in your mouth from the contrast dye.  A feeling of warmth.  A headache from the nitroglycerin. Follow these instructions at home:  Take over-the-counter and prescription medicines only as told by your health care provider.  If you are told, drink enough fluid to keep your  urine pale yellow. This will help to flush the contrast dye out of your body.  Most people can return to their normal activities right after the procedure. Ask your health care provider what activities are safe for you.  It is up to you to get the results of your procedure. Ask your health care provider, or the department that is doing the procedure, when your results will be ready.  Keep all follow-up visits as told by your health care provider. This is important. Contact a health care provider if: 1. You have any symptoms of allergy to the contrast dye. These include: ? Shortness of breath. ? Rash or hives. ? A racing heartbeat. Summary  A cardiac CT angiogram is a procedure to look at the heart and the area around the heart. It may be done to help find the cause of chest pains or other symptoms of heart disease.  During this procedure, a large X-ray machine, called a CT scanner, takes detailed pictures of the heart and the surrounding area after a contrast dye has been injected into blood vessels in the area.  Ask your health care provider about changing or stopping your regular medicines before the procedure. This is especially important if you are taking diabetes medicines, blood thinners, or  medicines to treat erectile dysfunction.  If you are told, drink enough fluid to keep your urine pale yellow. This will help to flush the contrast dye out of your body. This information is not intended to replace advice given to you by your health care provider. Make sure you discuss any questions you have with your health care provider. Document Revised: 01/10/2019 Document Reviewed: 01/10/2019 Elsevier Patient Education  [employer].   Echocardiogram An echocardiogram is a test that uses sound waves (ultrasound) to produce images of the heart. Images from an echocardiogram can provide important information about:  Heart size and shape.  The size and thickness and movement of your heart's walls.  Heart muscle function and strength.  Heart valve function or if you have stenosis. Stenosis is when the heart valves are too narrow.  If blood is flowing backward through the heart valves (regurgitation).  A tumor or infectious growth around the heart valves.  Areas of heart muscle that are not working well because of poor blood flow or injury from a heart attack.  Aneurysm detection. An aneurysm is a weak or damaged part of an artery wall. The wall bulges out from the normal force of blood pumping through the body. Tell a health care provider about:  Any allergies you have.  All medicines you are taking, including vitamins, herbs, eye drops, creams, and over-the-counter medicines.  Any blood disorders you have.  Any surgeries you have had.  Any medical conditions you have.  Whether you are pregnant or may be pregnant. What are the risks? Generally, this is a safe test. However, problems may occur, including an allergic reaction to dye (contrast) that may be used during the test. What happens before the test? No specific preparation is needed. You may eat and drink normally. What happens during the test?  You will take off your clothes from the waist up and put on a  hospital gown.  Electrodes or electrocardiogram (ECG)patches may be placed on your chest. The electrodes or patches are then connected to a device that monitors your heart rate and rhythm.  You will lie down on a table for an ultrasound exam. A gel will be applied to your chest  to help sound waves pass through your skin.  A handheld device, called a transducer, will be pressed against your chest and moved over your heart. The transducer produces sound waves that travel to your heart and bounce back (or "echo" back) to the transducer. These sound waves will be captured in real-time and changed into images of your heart that can be viewed on a video monitor. The images will be recorded on a computer and reviewed by your health care provider.  You may be asked to change positions or hold your breath for a short time. This makes it easier to get different views or better views of your heart.  In some cases, you may receive contrast through an IV in one of your veins. This can improve the quality of the pictures from your heart. The procedure may vary among health care providers and hospitals.   What can I expect after the test? You may return to your normal, everyday life, including diet, activities, and medicines, unless your health care provider tells you not to do that. Follow these instructions at home:  It is up to you to get the results of your test. Ask your health care provider, or the department that is doing the test, when your results will be ready.  Keep all follow-up visits. This is important. Summary  An echocardiogram is a test that uses sound waves (ultrasound) to produce images of the heart.  Images from an echocardiogram can provide important information about the size and shape of your heart, heart muscle function, heart valve function, and other possible heart problems.  You do not need to do anything to prepare before this test. You may eat and drink normally.  After the  echocardiogram is completed, you may return to your normal, everyday life, unless your health care provider tells you not to do that. This information is not intended to replace advice given to you by your health care provider. Make sure you discuss any questions you have with your health care provider. Document Revised: 01/08/2020 Document Reviewed: 01/08/2020 Elsevier Patient Education  2021 [employer].

## 2020-06-12 NOTE — Progress Notes (Signed)
Cardiology Office Note:    Date:  06/12/2020   ID:  Jeanne Ellison, DOB Jul 24, 1940, MRN [id number]  PCP:  Mateo Flow, MD  Cardiologist:  Berniece Salines, DO  Electrophysiologist:  None   Referring MD: Mateo Flow, MD    " I have had some   History of Present Illness:    Jeanne Ellison is a 80 y.o. female with a hx of hypertension, hyperlipidemia, diabetes, family history of coronary artery disease, obesity is here today to be evaluated for chest pain, shortness of breath and palpitations.  Patient tells me that over the last 2 years she has had chest pain off and on.  She ended up at [facility] back in November 2019 where she had a stress test and this was reported to her to be normal.  In the interim she has had intermittent chest pain but recently is getting frequent.  She notes that she has had midsternal sharp pain but mostly squeezing sensation which now sits on the left side does not really radiate it is about 5 out of 10 in intensity.  Nothing makes it better or worse it last for about 7 minutes prior to resolution.  It comes on with associated shortness of breath. Which is different is not associated with chest pain is intermittent palpitations.  She states abrupt onset of fast heartbeat which last for minutes at a time before resolution.  She has not had any syncope, no lightheadedness or dizziness at this associated with his palpitations.  She is here today with her husband.  Past Medical History:  Diagnosis Date  . Acid reflux disease   . Generalized anxiety disorder   . Hyperlipidemia   . Hypertension   . Osteoarthritis   . Type 2 diabetes mellitus with hyperlipidemia [facility])     Past Surgical History:  Procedure Laterality Date  . APPENDECTOMY    . TOTAL ABDOMINAL HYSTERECTOMY W/ BILATERAL SALPINGOOPHORECTOMY      Current Medications: Current Meds  Medication Sig  . ALPRAZolam (XANAX) 0.5 MG tablet Take 0.5 mg by mouth as needed.  Marland Kitchen  ascorbic acid (VITAMIN C) 250 MG tablet Take 100 mg by mouth.  Marland Kitchen aspirin EC 81 MG tablet Take 81 mg by mouth daily. Swallow whole.  . bisoprolol-hydrochlorothiazide (ZIAC) 2.5-6.25 MG tablet Take 0.5 tablets by mouth daily.  . Calcium-Vitamins C & D (CALCIUM/C/D PO) Take by mouth.  . celecoxib (CELEBREX) 200 MG capsule Take 200 mg by mouth 2 (two) times daily.  . cholecalciferol (VITAMIN D3) 25 MCG (1000 UNIT) tablet Take 1,000 Units by mouth daily.  . citalopram (CELEXA) 20 MG tablet   . folic acid (FOLVITE) 1 MG tablet Take 1 mg by mouth daily.  Marland Kitchen glucosamine-chondroitin 500-400 MG tablet Take 1 tablet by mouth 3 (three) times daily.  . metFORMIN (GLUCOPHAGE-XR) 750 MG 24 hr tablet   . rosuvastatin (CRESTOR) 5 MG tablet   . vitamin B-12 (CYANOCOBALAMIN) 250 MCG tablet Take by mouth.  . vitamin E 1000 UNIT capsule Take by mouth.     Allergies:   Sulfinpyrazone and Vytorin [ezetimibe-simvastatin]   Social History   Socioeconomic History  . Marital status: Unknown    Spouse name: Not on file  . Number of children: Not on file  . Years of education: Not on file  . Highest education level: Not on file  Occupational History  . Not on file  Tobacco Use  . Smoking status: Never Smoker  . Smokeless  tobacco: Never Used  Substance and Sexual Activity  . Alcohol use: Never  . Drug use: Not on file  . Sexual activity: Not on file  Other Topics Concern  . Not on file  Social History Narrative  . Not on file   Social Determinants of Health   Financial Resource Strain: Not on file  Food Insecurity: Not on file  Transportation Needs: Not on file  Physical Activity: Not on file  Stress: Not on file  Social Connections: Not on file     Family History: The patient's family history includes Alzheimer's disease in her mother; Heart attack in her father and mother; Suicidality in her grandson.  ROS:   Review of Systems  Constitution: Negative for decreased appetite, fever and weight  gain.  HENT: Negative for congestion, ear discharge, hoarse voice and sore throat.   Eyes: Negative for discharge, redness, vision loss in right eye and visual halos.  Cardiovascular: Negative for chest pain, dyspnea on exertion, leg swelling, orthopnea and palpitations.  Respiratory: Negative for cough, hemoptysis, shortness of breath and snoring.   Endocrine: Negative for heat intolerance and polyphagia.  Hematologic/Lymphatic: Negative for bleeding problem. Does not bruise/bleed easily.  Skin: Negative for flushing, nail changes, rash and suspicious lesions.  Musculoskeletal: Negative for arthritis, joint pain, muscle cramps, myalgias, neck pain and stiffness.  Gastrointestinal: Negative for abdominal pain, bowel incontinence, diarrhea and excessive appetite.  Genitourinary: Negative for decreased libido, genital sores and incomplete emptying.  Neurological: Negative for brief paralysis, focal weakness, headaches and loss of balance.  Psychiatric/Behavioral: Negative for altered mental status, depression and suicidal ideas.  Allergic/Immunologic: Negative for HIV exposure and persistent infections.    EKGs/Labs/Other Studies Reviewed:    The following studies were reviewed today:   EKG:  The ekg ordered today demonstrates sinus rhythm, heart rate 60 bpm with P wave morphology suggesting left atrial enlargement no prior EKG for comparison.   Recent Labs: No results found for requested labs within last 8760 hours.  Recent Lipid Panel No results found for: CHOL, TRIG, HDL, CHOLHDL, VLDL, LDLCALC, LDLDIRECT  Physical Exam:    VS:  BP 110/68 (BP Location: Left Arm)   Pulse 60   Ht 4\' 11"  (1.499 m)   Wt 155 lb 9.6 oz (70.6 kg)   SpO2 98%   BMI 31.43 kg/m     Wt Readings from Last 3 Encounters:  06/12/20 155 lb 9.6 oz (70.6 kg)     GEN: Well nourished, well developed in no acute distress HEENT: Normal NECK: No JVD; No carotid bruits LYMPHATICS: No lymphadenopathy CARDIAC:  S1S2 noted,RRR, no murmurs, rubs, gallops RESPIRATORY:  Clear to auscultation without rales, wheezing or rhonchi  ABDOMEN: Soft, non-tender, non-distended, +bowel sounds, no guarding. EXTREMITIES: No edema, No cyanosis, no clubbing MUSCULOSKELETAL:  No deformity  SKIN: Warm and dry NEUROLOGIC:  Alert and oriented x 3, non-focal PSYCHIATRIC:  Normal affect, good insight  ASSESSMENT:    1. Precordial pain   2. Dyspnea on exertion   3. Palpitations   4. Primary hypertension   5. Mixed hyperlipidemia   6. Type 2 diabetes mellitus without complication, without long-term current use of insulin (HCC)   7. Obesity (BMI 30-39.9)    PLAN:     Her chest pain is concerning patient does have intermediate risk to high risk factor (age, diabetes, family history, hypertension, hyperlipidemia, obesity) for coronary artery disease would not like to do is pursue an ischemic evaluation in this patient.  Shared decision a coronary  CTA at this time is appropriate.  I have discussed with the patient about the testing.  The patient has no IV contrast allergy and is agreeable to proceed with this test.  I would like to rule out a cardiovascular etiology of this palpitation, therefore at this time I would like to placed a zio patch for 7 days.  In additon for shortness of breath, a transthoracic echocardiogram will be ordered to assess LV/RV function and any structural abnormalities. Once these testing have been performed amd reviewed further reccomendations will be made. For now, I do reccomend that the patient goes to the nearest ED if  symptoms recur.  Blood pressure is acceptable, continue with current antihypertensive regimen which includes bisoprolol-hydrochlorothiazide 2.5-6.25 mg daily.  Hyperlipidemia - continue with current statin medication.  Her most recent lipid profile which was done by her PCP in October 2021 shows triglyceride 183, HDL 40, LDL 87, total cholesterol 148.  This is being managed  by his primary care doctor.  No adjustments for antidiabetic medications were made today.  The patient understands the need to lose weight with diet and exercise. We have discussed specific strategies for this.  The patient is in agreement with the above plan. The patient left the office in stable condition.  The patient will follow up in 12 weeks or sooner if needed.   Medication Adjustments/Labs and Tests Ordered: Current medicines are reviewed at length with the patient today.  Concerns regarding medicines are outlined above.  No orders of the defined types were placed in this encounter.  No orders of the defined types were placed in this encounter.   Patient Instructions  Medication Instructions:  No medication changes. *If you need a refill on your cardiac medications before your next appointment, please call your pharmacy*   Lab Work: Your physician recommends that you return for lab work in: 1 week prior to your CT scan. You can come Monday through Friday 8:30 am to 12:00 pm and 1:15 to 4:30. You do not need to make an appointment as the order has already been placed.   If you have labs (blood work) drawn today and your tests are completely normal, you will receive your results only by: Marland Kitchen MyChart Message (if you have MyChart) OR . A paper copy in the mail If you have any lab test that is abnormal or we need to change your treatment, we will call you to review the results.   Testing/Procedures: Your cardiac CT will be scheduled at:   [facility] [place], [place] [address] [phone]   If scheduled at [facility], please arrive at the [facility] main entrance of [facility] 30 minutes prior to test start time. Proceed to the [facility] Radiology Department (first floor) to check-in and test prep.  Please follow these instructions carefully (unless otherwise directed):  On the Night Before the Test: . Be sure to Drink  plenty of water. . Do not consume any caffeinated/decaffeinated beverages or chocolate 12 hours prior to your test. . Do not take any antihistamines 12 hours prior to your test.  On the Day of the Test: . Drink plenty of water. Do not drink any water within one hour of the test. . Do not eat any food 4 hours prior to the test. . You may take your regular medications prior to the test.  . Take metoprolol (Lopressor) two hours prior to test. . FEMALES- please wear underwire-free bra if  available      After the Test: . Drink plenty of water. . After receiving IV contrast, you may experience a mild flushed feeling. This is normal. . On occasion, you may experience a mild rash up to 24 hours after the test. This is not dangerous. If this occurs, you can take Benadryl 25 mg and increase your fluid intake. . If you experience trouble breathing, this can be serious. If it is severe call 911 IMMEDIATELY. If it is mild, please call our office. . If you take any of these medications: Glipizide/Metformin, Avandament, Glucavance, please do not take 48 hours after completing test unless otherwise instructed.   Once we have confirmed authorization from your insurance company, we will call you to set up a date and time for your test. Based on how quickly your insurance processes prior authorizations requests, please allow up to 4 weeks to be contacted for scheduling your Cardiac CT appointment. Be advised that routine Cardiac CT appointments could be scheduled as many as 8 weeks after your provider has ordered it.  For non-scheduling related questions, please contact the cardiac imaging nurse navigator should you have any questions/concerns: Marchia Bond, Cardiac Imaging Nurse Navigator Burley Saver, Interim Cardiac Imaging Nurse [facility] Direct Office Dial: [phone]   For scheduling needs, including cancellations and rescheduling, please call Vivien Rota at [phone].      WHY IS MY DOCTOR PRESCRIBING ZIO? The Zio system is proven and trusted by physicians to detect and diagnose irregular heart rhythms -- and has been prescribed to hundreds of thousands of patients.  The FDA has cleared the Zio system to monitor for many different kinds of irregular heart rhythms. In a study, physicians were able to reach a diagnosis 90% of the time with the Zio system1.  You can wear the Zio monitor -- a small, discreet, comfortable patch -- during your normal day-to-day activity, including while you sleep, shower, and exercise, while it records every single heartbeat for analysis.  1Barrett, P., et al. Comparison of 24 Hour Holter Monitoring Versus 14 Day Novel Adhesive Patch Electrocardiographic Monitoring. [place], 2014.  ZIO VS. HOLTER MONITORING The Zio monitor can be comfortably worn for up to 14 days. Holter monitors can be worn for 24 to 48 hours, limiting the time to record any irregular heart rhythms you may have. Zio is able to capture data for the 51% of patients who have their first symptom-triggered arrhythmia after 48 hours.1  LIVE WITHOUT RESTRICTIONS The Zio ambulatory cardiac monitor is a small, unobtrusive, and water-resistant patch--you might even forget you're wearing it. The Zio monitor records and stores every beat of your heart, whether you're sleeping, working out, or showering. Wear the monitor for 7 days, remove 06/19/20.  Follow-Up: At [facility], you and your health needs are our priority.  As part of our continuing mission to provide you with exceptional heart care, we have created designated Provider Care Teams.  These Care Teams include your primary Cardiologist (physician) and Advanced Practice Providers (APPs -  Physician Assistants and Nurse Practitioners) who all work together to provide you with the care you need, when you need it.  We recommend signing up for the patient portal called "MyChart".  Sign up  information is provided on this After Visit Summary.  MyChart is used to connect with patients for Virtual Visits (Telemedicine).  Patients are able to view lab/test results, encounter notes, upcoming appointments, etc.  Non-urgent messages can be sent  to your provider as well.   To learn more about what you can do with MyChart, go to [email or web address].    Your next appointment:   12 week(s)  The format for your next appointment:   In Person  Provider:   Berniece Salines, DO   Other Instructions Cardiac CT Angiogram A cardiac CT angiogram is a procedure to look at the heart and the area around the heart. It may be done to help find the cause of chest pains or other symptoms of heart disease. During this procedure, a substance called contrast dye is injected into the blood vessels in the area to be checked. A large X-ray machine, called a CT scanner, then takes detailed pictures of the heart and the surrounding area. The procedure is also sometimes called a coronary CT angiogram, coronary artery scanning, or CTA. A cardiac CT angiogram allows the health care provider to see how well blood is flowing to and from the heart. The health care provider will be able to see if there are any problems, such as:  Blockage or narrowing of the coronary arteries in the heart.  Fluid around the heart.  Signs of weakness or disease in the muscles, valves, and tissues of the heart. Tell a health care provider about:  Any allergies you have. This is especially important if you have had a previous allergic reaction to contrast dye.  All medicines you are taking, including vitamins, herbs, eye drops, creams, and over-the-counter medicines.  Any blood disorders you have.  Any surgeries you have had.  Any medical conditions you have.  Whether you are pregnant or may be pregnant.  Any anxiety disorders, chronic pain, or other conditions you have that may increase your stress or prevent you from lying  still. What are the risks? Generally, this is a safe procedure. However, problems may occur, including: 1. Bleeding. 2. Infection. 3. Allergic reactions to medicines or dyes. 4. Damage to other structures or organs. 5. Kidney damage from the contrast dye that is used. 6. Increased risk of cancer from radiation exposure. This risk is low. Talk with your health care provider about: ? The risks and benefits of testing. ? How you can receive the lowest dose of radiation. What happens before the procedure? 1. Wear comfortable clothing and remove any jewelry, glasses, dentures, and hearing aids. 2. Follow instructions from your health care provider about eating and drinking. This may include: ? For 12 hours before the procedure -- avoid caffeine. This includes tea, coffee, soda, energy drinks, and diet pills. Drink plenty of water or other fluids that do not have caffeine in them. Being well hydrated can prevent complications. ? For 4-6 hours before the procedure -- stop eating and drinking. The contrast dye can cause nausea, but this is less likely if your stomach is empty. 3. Ask your health care provider about changing or stopping your regular medicines. This is especially important if you are taking diabetes medicines, blood thinners, or medicines to treat problems with erections (erectile dysfunction). What happens during the procedure?  1. Hair on your chest may need to be removed so that small sticky patches called electrodes can be placed on your chest. These will transmit information that helps to monitor your heart during the procedure. 2. An IV will be inserted into one of your veins. 3. You might be given a medicine to control your heart rate during the procedure. This will help to ensure that good images are obtained. 4. You will be  asked to lie on an exam table. This table will slide in and out of the CT machine during the procedure. 5. Contrast dye will be injected into the IV. You  might feel warm, or you may get a metallic taste in your mouth. 6. You will be given a medicine called nitroglycerin. This will relax or dilate the arteries in your heart. 7. The table that you are lying on will move into the CT machine tunnel for the scan. 8. The person running the machine will give you instructions while the scans are being done. You may be asked to: ? Keep your arms above your head. ? Hold your breath. ? Stay very still, even if the table is moving. 9. When the scanning is complete, you will be moved out of the machine. 10. The IV will be removed. The procedure may vary among health care providers and hospitals. What can I expect after the procedure? After your procedure, it is common to have:  A metallic taste in your mouth from the contrast dye.  A feeling of warmth.  A headache from the nitroglycerin. Follow these instructions at home:  Take over-the-counter and prescription medicines only as told by your health care provider.  If you are told, drink enough fluid to keep your urine pale yellow. This will help to flush the contrast dye out of your body.  Most people can return to their normal activities right after the procedure. Ask your health care provider what activities are safe for you.  It is up to you to get the results of your procedure. Ask your health care provider, or the department that is doing the procedure, when your results will be ready.  Keep all follow-up visits as told by your health care provider. This is important. Contact a health care provider if: 1. You have any symptoms of allergy to the contrast dye. These include: ? Shortness of breath. ? Rash or hives. ? A racing heartbeat. Summary  A cardiac CT angiogram is a procedure to look at the heart and the area around the heart. It may be done to help find the cause of chest pains or other symptoms of heart disease.  During this procedure, a large X-ray machine, called a CT scanner,  takes detailed pictures of the heart and the surrounding area after a contrast dye has been injected into blood vessels in the area.  Ask your health care provider about changing or stopping your regular medicines before the procedure. This is especially important if you are taking diabetes medicines, blood thinners, or medicines to treat erectile dysfunction.  If you are told, drink enough fluid to keep your urine pale yellow. This will help to flush the contrast dye out of your body. This information is not intended to replace advice given to you by your health care provider. Make sure you discuss any questions you have with your health care provider. Document Revised: 01/10/2019 Document Reviewed: 01/10/2019 Elsevier Patient Education  [place].      Adopting a Healthy Lifestyle.  Know what a healthy weight is for you (roughly BMI <25) and aim to maintain this   Aim for 7+ servings of fruits and vegetables daily   65-80+ fluid ounces of water or unsweet tea for healthy kidneys   Limit to max 1 drink of alcohol per day; avoid smoking/tobacco   Limit animal fats in diet for cholesterol and heart health - choose grass fed whenever available   Avoid highly processed  foods, and foods high in saturated/trans fats   Aim for low stress - take time to unwind and care for your mental health   Aim for 150 min of moderate intensity exercise weekly for heart health, and weights twice weekly for bone health   Aim for 7-9 hours of sleep daily   When it comes to diets, agreement about the perfect plan isnt easy to find, even among the experts. Experts at the [employer] developed an idea known as the Healthy Eating Plate. Just imagine a plate divided into logical, healthy portions.   The emphasis is on diet quality:   Load up on vegetables and fruits - one-half of your plate: Aim for color and variety, and remember that potatoes dont count.   Go for whole  grains - one-quarter of your plate: Whole wheat, barley, wheat berries, quinoa, oats, brown rice, and foods made with them. If you want pasta, go with whole wheat pasta.   Protein power - one-quarter of your plate: Fish, chicken, beans, and nuts are all healthy, versatile protein sources. Limit red meat.   The diet, however, does go beyond the plate, offering a few other suggestions.   Use healthy plant oils, such as olive, canola, soy, corn, sunflower and peanut. Check the labels, and avoid partially hydrogenated oil, which have unhealthy trans fats.   If youre thirsty, drink water. Coffee and tea are good in moderation, but skip sugary drinks and limit milk and dairy products to one or two daily servings.   The type of carbohydrate in the diet is more important than the amount. Some sources of carbohydrates, such as vegetables, fruits, whole grains, and beans-are healthier than others.   Finally, stay active  Signed, Berniece Salines, DO  06/12/2020 9:24 AM    [facility]

## 2020-06-19 ENCOUNTER — Telehealth: Payer: Self-pay

## 2020-06-19 DIAGNOSIS — R0600 Dyspnea, unspecified: Secondary | ICD-10-CM | POA: Diagnosis not present

## 2020-06-19 DIAGNOSIS — R002 Palpitations: Secondary | ICD-10-CM | POA: Diagnosis not present

## 2020-06-19 LAB — BASIC METABOLIC PANEL
BUN/Creatinine Ratio: 37 — ABNORMAL HIGH (ref 12–28)
BUN: 25 mg/dL (ref 8–27)
CO2: 27 mmol/L (ref 20–29)
Calcium: 10.7 mg/dL — ABNORMAL HIGH (ref 8.7–10.3)
Chloride: 101 mmol/L (ref 96–106)
Creatinine, Ser: 0.67 mg/dL (ref 0.57–1.00)
GFR calc Af Amer: 97 mL/min/{1.73_m2} (ref >59)
GFR calc non Af Amer: 84 mL/min/{1.73_m2} (ref >59)
Glucose: 100 mg/dL — ABNORMAL HIGH (ref 65–99)
Potassium: 4.3 mmol/L (ref 3.5–5.2)
Sodium: 143 mmol/L (ref 134–144)

## 2020-06-19 NOTE — Telephone Encounter (Signed)
Error

## 2020-06-24 ENCOUNTER — Telehealth (HOSPITAL_COMMUNITY): Payer: Self-pay | Admitting: Emergency Medicine

## 2020-06-24 ENCOUNTER — Other Ambulatory Visit: Payer: Self-pay

## 2020-06-24 ENCOUNTER — Ambulatory Visit (INDEPENDENT_AMBULATORY_CARE_PROVIDER_SITE_OTHER): Payer: PPO

## 2020-06-24 DIAGNOSIS — R0600 Dyspnea, unspecified: Secondary | ICD-10-CM | POA: Diagnosis not present

## 2020-06-24 DIAGNOSIS — R002 Palpitations: Secondary | ICD-10-CM | POA: Diagnosis not present

## 2020-06-24 DIAGNOSIS — R0609 Other forms of dyspnea: Secondary | ICD-10-CM

## 2020-06-24 LAB — ECHOCARDIOGRAM COMPLETE
Area-P 1/2: 3.31 cm2
S' Lateral: 2.5 cm

## 2020-06-24 NOTE — Telephone Encounter (Signed)
Attempted to call patient regarding upcoming cardiac CT appointment. Left message on voicemail with name and callback number Jeanne Bond RN Navigator Cardiac Imaging [facility] [phone] Office [phone] Cell  Left message with husband to have patient call me back

## 2020-06-24 NOTE — Telephone Encounter (Signed)
Pt returning phone call regarding upcoming cardiac imaging study; pt verbalizes understanding of appt date/time, parking situation and where to check in, pre-test NPO status and medications ordered, and verified current allergies; name and call back number provided for further questions should they arise Rajvi Armentor RN Navigator Cardiac Imaging  [facility] Vascular [phone] office [phone] cell   

## 2020-06-26 ENCOUNTER — Ambulatory Visit (HOSPITAL_COMMUNITY)
Admission: RE | Admit: 2020-06-26 | Discharge: 2020-06-26 | Disposition: A | Discharge status: Home / Self Care | Payer: PPO | Source: Ambulatory Visit | Attending: Cardiology | Admitting: Cardiology

## 2020-06-26 ENCOUNTER — Other Ambulatory Visit: Payer: Self-pay

## 2020-06-26 ENCOUNTER — Encounter: Payer: Self-pay | Admitting: *Deleted

## 2020-06-26 DIAGNOSIS — R002 Palpitations: Secondary | ICD-10-CM

## 2020-06-26 DIAGNOSIS — E119 Type 2 diabetes mellitus without complications: Secondary | ICD-10-CM | POA: Insufficient documentation

## 2020-06-26 DIAGNOSIS — Z006 Encounter for examination for normal comparison and control in clinical research program: Secondary | ICD-10-CM

## 2020-06-26 DIAGNOSIS — I700 Atherosclerosis of aorta: Secondary | ICD-10-CM | POA: Insufficient documentation

## 2020-06-26 DIAGNOSIS — R0609 Other forms of dyspnea: Secondary | ICD-10-CM

## 2020-06-26 DIAGNOSIS — R072 Precordial pain: Secondary | ICD-10-CM | POA: Diagnosis not present

## 2020-06-26 DIAGNOSIS — I2510 Atherosclerotic heart disease of native coronary artery without angina pectoris: Secondary | ICD-10-CM

## 2020-06-26 DIAGNOSIS — R0600 Dyspnea, unspecified: Secondary | ICD-10-CM

## 2020-06-26 MED ORDER — IOHEXOL 350 MG/ML SOLN
80.0000 mL | Freq: Once | INTRAVENOUS | Status: AC | PRN
  Administered 2020-06-26: 80 mL via INTRAVENOUS

## 2020-06-26 MED ORDER — NITROGLYCERIN 0.4 MG SL SUBL
0.8000 mg | SUBLINGUAL_TABLET | Freq: Once | SUBLINGUAL | Status: AC
  Administered 2020-06-26: 0.8 mg via SUBLINGUAL

## 2020-06-26 MED ORDER — NITROGLYCERIN 0.4 MG SL SUBL
SUBLINGUAL_TABLET | SUBLINGUAL | Status: AC
  Filled 2020-06-26: qty 2

## 2020-06-26 NOTE — Progress Notes (Signed)
CT scan completed. Tolerated well. D/C home ambulatory with husband, awake and alert. In no distress 

## 2020-06-26 NOTE — Research (Signed)
IDENTIFY Informed Consent                  Subject Name:   Jeanne Ellison   Subject met inclusion and exclusion criteria.  The informed consent form, study requirements and expectations were reviewed with the subject and questions and concerns were addressed prior to the signing of the consent form.  The subject verbalized understanding of the trial requirements.  The subject agreed to participate in the IDENTIFY trial and signed the informed consent.  The informed consent was obtained prior to performance of any protocol-specific procedures for the subject.  A copy of the signed informed consent was given to the subject and a copy was placed in the subject's medical record.   [place] Mychael Smock, Research Assistant  06/26/2020  12:09 a.m.

## 2020-06-27 ENCOUNTER — Other Ambulatory Visit: Payer: Self-pay

## 2020-06-27 DIAGNOSIS — I2510 Atherosclerotic heart disease of native coronary artery without angina pectoris: Secondary | ICD-10-CM | POA: Diagnosis not present

## 2020-06-27 DIAGNOSIS — R002 Palpitations: Secondary | ICD-10-CM | POA: Diagnosis not present

## 2020-06-27 MED ORDER — ROSUVASTATIN CALCIUM 10 MG PO TABS: 10.0000 mg | ORAL_TABLET | Freq: Every day | ORAL | 3 refills | Status: DC

## 2020-06-27 NOTE — Progress Notes (Signed)
[facility]

## 2020-06-29 DIAGNOSIS — E1169 Type 2 diabetes mellitus with other specified complication: Secondary | ICD-10-CM | POA: Diagnosis not present

## 2020-06-29 DIAGNOSIS — I10 Essential (primary) hypertension: Secondary | ICD-10-CM | POA: Diagnosis not present

## 2020-06-29 DIAGNOSIS — K219 Gastro-esophageal reflux disease without esophagitis: Secondary | ICD-10-CM | POA: Diagnosis not present

## 2020-06-29 DIAGNOSIS — E782 Mixed hyperlipidemia: Secondary | ICD-10-CM | POA: Diagnosis not present

## 2020-07-09 DIAGNOSIS — R339 Retention of urine, unspecified: Secondary | ICD-10-CM | POA: Diagnosis not present

## 2020-07-09 DIAGNOSIS — N952 Postmenopausal atrophic vaginitis: Secondary | ICD-10-CM | POA: Diagnosis not present

## 2020-07-09 DIAGNOSIS — N390 Urinary tract infection, site not specified: Secondary | ICD-10-CM | POA: Diagnosis not present

## 2020-07-09 DIAGNOSIS — R3911 Hesitancy of micturition: Secondary | ICD-10-CM | POA: Diagnosis not present

## 2020-07-28 DIAGNOSIS — K219 Gastro-esophageal reflux disease without esophagitis: Secondary | ICD-10-CM | POA: Diagnosis not present

## 2020-07-28 DIAGNOSIS — I10 Essential (primary) hypertension: Secondary | ICD-10-CM | POA: Diagnosis not present

## 2020-07-28 DIAGNOSIS — E1169 Type 2 diabetes mellitus with other specified complication: Secondary | ICD-10-CM | POA: Diagnosis not present

## 2020-07-28 DIAGNOSIS — E782 Mixed hyperlipidemia: Secondary | ICD-10-CM | POA: Diagnosis not present

## 2020-07-29 DIAGNOSIS — N390 Urinary tract infection, site not specified: Secondary | ICD-10-CM | POA: Diagnosis not present

## 2020-07-31 DIAGNOSIS — H524 Presbyopia: Secondary | ICD-10-CM | POA: Diagnosis not present

## 2020-08-27 DIAGNOSIS — I10 Essential (primary) hypertension: Secondary | ICD-10-CM | POA: Diagnosis not present

## 2020-08-27 DIAGNOSIS — K219 Gastro-esophageal reflux disease without esophagitis: Secondary | ICD-10-CM | POA: Diagnosis not present

## 2020-08-27 DIAGNOSIS — E782 Mixed hyperlipidemia: Secondary | ICD-10-CM | POA: Diagnosis not present

## 2020-08-27 DIAGNOSIS — E1169 Type 2 diabetes mellitus with other specified complication: Secondary | ICD-10-CM | POA: Diagnosis not present

## 2020-09-02 DIAGNOSIS — E785 Hyperlipidemia, unspecified: Secondary | ICD-10-CM | POA: Insufficient documentation

## 2020-09-02 DIAGNOSIS — E1169 Type 2 diabetes mellitus with other specified complication: Secondary | ICD-10-CM | POA: Insufficient documentation

## 2020-09-04 ENCOUNTER — Ambulatory Visit: Payer: PPO | Admitting: Cardiology

## 2020-09-04 ENCOUNTER — Other Ambulatory Visit: Payer: Self-pay

## 2020-09-04 ENCOUNTER — Encounter: Payer: Self-pay | Admitting: Cardiology

## 2020-09-04 VITALS — BP 120/72 | HR 62 | Ht 61.0 in | Wt 157.0 lb

## 2020-09-04 DIAGNOSIS — I2510 Atherosclerotic heart disease of native coronary artery without angina pectoris: Secondary | ICD-10-CM | POA: Diagnosis not present

## 2020-09-04 DIAGNOSIS — I10 Essential (primary) hypertension: Secondary | ICD-10-CM | POA: Diagnosis not present

## 2020-09-04 DIAGNOSIS — E782 Mixed hyperlipidemia: Secondary | ICD-10-CM | POA: Diagnosis not present

## 2020-09-04 DIAGNOSIS — E119 Type 2 diabetes mellitus without complications: Secondary | ICD-10-CM | POA: Diagnosis not present

## 2020-09-04 NOTE — Patient Instructions (Signed)
Medication Instructions:  Your physician recommends that you continue on your current medications as directed. Please refer to the Current Medication list given to you today.  *If you need a refill on your cardiac medications before your next appointment, please call your pharmacy*   Lab Work: NONE If you have labs (blood work) drawn today and your tests are completely normal, you will receive your results only by: Marland Kitchen MyChart Message (if you have MyChart) OR . A paper copy in the mail If you have any lab test that is abnormal or we need to change your treatment, we will call you to review the results.   Testing/Procedures: NONE   Follow-Up: At [facility], you and your health needs are our priority.  As part of our continuing mission to provide you with exceptional heart care, we have created designated Provider Care Teams.  These Care Teams include your primary Cardiologist (physician) and Advanced Practice Providers (APPs -  Physician Assistants and Nurse Practitioners) who all work together to provide you with the care you need, when you need it.  We recommend signing up for the patient portal called "MyChart".  Sign up information is provided on this After Visit Summary.  MyChart is used to connect with patients for Virtual Visits (Telemedicine).  Patients are able to view lab/test results, encounter notes, upcoming appointments, etc.  Non-urgent messages can be sent to your provider as well.   To learn more about what you can do with MyChart, go to [email or web address].    Your next appointment:   1 year(s)  The format for your next appointment:   In Person  Provider:   Berniece Salines, DO   Other Instructions

## 2020-09-04 NOTE — Progress Notes (Signed)
Cardiology Office Note:    Date:  09/04/2020   ID:  Jeanne Ellison, DOB 1940-08-24, MRN [id number]  PCP:  Mateo Flow, MD  Cardiologist:  Berniece Salines, DO  Electrophysiologist:  None   Referring MD: Mateo Flow, MD   I am doing fine  History of Present Illness:    Jeanne Ellison is a 80 y.o. female with a hx of hypertension, hyperlipidemia, diabetes, coronary artery disease seen on her coronary CTA is here today for follow-up visit.  I saw the patient in January 2022 at that time she had some intermittent chest pain with associated shortness of breath.  At her visit I recommended she undergo a coronary CTA and she was agreeable.  Additional ZIO monitor was placed on the patient as well.  She has had a test that she is here today for follow-up visit.  We did tell the patient about her diagnosis of coronary artery disease prior.  She has since been started on aspirin and statin.  She does not offer any complaints today.  She tells me she has been doing well since the last time I saw her.   Past Medical History:  Diagnosis Date  . Acid reflux disease   . Generalized anxiety disorder   . Hyperlipidemia   . Hypertension   . Osteoarthritis   . Type 2 diabetes mellitus with hyperlipidemia [facility])     Past Surgical History:  Procedure Laterality Date  . APPENDECTOMY    . TOTAL ABDOMINAL HYSTERECTOMY W/ BILATERAL SALPINGOOPHORECTOMY      Current Medications: Current Meds  Medication Sig  . ALPRAZolam (XANAX) 0.5 MG tablet Take 0.5 mg by mouth as needed for anxiety or sleep.  Marland Kitchen ascorbic acid (VITAMIN C) 250 MG tablet Take 100 mg by mouth daily.  Marland Kitchen aspirin EC 81 MG tablet Take 81 mg by mouth daily. Swallow whole.  . bisoprolol-hydrochlorothiazide (ZIAC) 2.5-6.25 MG tablet Take 0.5 tablets by mouth daily.  . Calcium-Vitamins C & D (CALCIUM/C/D PO) Take 1 tablet by mouth daily.  . celecoxib (CELEBREX) 200 MG capsule Take 200 mg by mouth 2 (two) times daily.  .  cholecalciferol (VITAMIN D3) 25 MCG (1000 UNIT) tablet Take 1,000 Units by mouth daily.  . citalopram (CELEXA) 20 MG tablet Take 20 mg by mouth daily.  . folic acid (FOLVITE) 1 MG tablet Take 1 mg by mouth daily.  Marland Kitchen glucosamine-chondroitin 500-400 MG tablet Take 1 tablet by mouth 3 (three) times daily.  . metFORMIN (GLUCOPHAGE-XR) 750 MG 24 hr tablet Take 750 mg by mouth daily with breakfast.  . rosuvastatin (CRESTOR) 10 MG tablet Take 1 tablet (10 mg total) by mouth daily.  . tamsulosin (FLOMAX) 0.4 MG CAPS capsule Take 0.4 mg by mouth daily.  . vitamin B-12 (CYANOCOBALAMIN) 250 MCG tablet Take 250 mcg by mouth daily.  . vitamin E 1000 UNIT capsule Take 1,000 Units by mouth daily.     Allergies:   Sulfinpyrazone and Vytorin [ezetimibe-simvastatin]   Social History   Socioeconomic History  . Marital status: Married    Spouse name: Not on file  . Number of children: Not on file  . Years of education: Not on file  . Highest education level: Not on file  Occupational History  . Not on file  Tobacco Use  . Smoking status: Never Smoker  . Smokeless tobacco: Never Used  Substance and Sexual Activity  . Alcohol use: Never  . Drug use: Not on file  . Sexual activity: Not  on file  Other Topics Concern  . Not on file  Social History Narrative  . Not on file   Social Determinants of Health   Financial Resource Strain: Not on file  Food Insecurity: Not on file  Transportation Needs: Not on file  Physical Activity: Not on file  Stress: Not on file  Social Connections: Not on file     Family History: The patient's family history includes Alzheimer's disease in her mother; Heart attack in her father and mother; Suicidality in her grandson.  ROS:   Review of Systems  Constitution: Negative for decreased appetite, fever and weight gain.  HENT: Negative for congestion, ear discharge, hoarse voice and sore throat.   Eyes: Negative for discharge, redness, vision loss in right eye and  visual halos.  Cardiovascular: Negative for chest pain, dyspnea on exertion, leg swelling, orthopnea and palpitations.  Respiratory: Negative for cough, hemoptysis, shortness of breath and snoring.   Endocrine: Negative for heat intolerance and polyphagia.  Hematologic/Lymphatic: Negative for bleeding problem. Does not bruise/bleed easily.  Skin: Negative for flushing, nail changes, rash and suspicious lesions.  Musculoskeletal: Negative for arthritis, joint pain, muscle cramps, myalgias, neck pain and stiffness.  Gastrointestinal: Negative for abdominal pain, bowel incontinence, diarrhea and excessive appetite.  Genitourinary: Negative for decreased libido, genital sores and incomplete emptying.  Neurological: Negative for brief paralysis, focal weakness, headaches and loss of balance.  Psychiatric/Behavioral: Negative for altered mental status, depression and suicidal ideas.  Allergic/Immunologic: Negative for HIV exposure and persistent infections.    EKGs/Labs/Other Studies Reviewed:    The following studies were reviewed today:   EKG: None today  Coronary CTA June 26, 2020 Aorta: Normal size. Mild calcifications in the aortic root and descending aorta. No dissection.  Aortic Valve:  Trileaflet.  No calcifications.  Coronary calcium score - 130  Coronary Arteries:  Normal coronary origin.  Right dominance.  RCA is a large dominant artery that gives rise to PDA and PLVB. The proximal and mid RCA with no plaques. There is a mid to distal mild (24-49%) calcification in the  Left main is a large artery that gives rise to LAD and LCX arteries.  LAD is a large vessel. The proximal LAD with diffuse mild calcified plaques. The proximal to mid LAD with moderate (50-69%) calcified plaques. The mid LAD with mild calcified plaque. Mid to distal LAD with mild calcified plaques. D1 and D2 are medium sized vessels.  LCX is a non-dominant artery that gives rise to one large  OM1 branch. There is no plaque.  Other findings:  Normal pulmonary vein drainage into the left atrium.  Normal left atrial appendage without a thrombus.  Normal size of the pulmonary artery.  Mitral Annular Calcification.  IMPRESSION: 1. Coronary calcium score of 130. This was 29 percentile for age and sex matched control.  2. Normal coronary origin with right dominance.  3. Moderate CAD.  CADRADS 3.  This study will be send for FFRct.  4. Moderate Mitral annular calcification.  5. Aortic atherosclerosis  ZIO monitor June 27, 2020  The patient wore the monitor for 6 days 22 hours starting 06/12/2020 . Indication: Palpitations  The minimum heart rate was 45  bpm, maximum heart rate was 101 bpm, and average heart rate was 62 bpm. Predominant underlying rhythm was Sinus Rhythm.   Premature atrial complexes were rare.  Premature Ventricular complexes were rare.   No ventricular tachycardia, no pauses, no AV block, no supraventricular tachycardia and no atrial fibrillation present.  12 patient triggered events mostly associated with sinus rhythm. 2 dairy events associated with sinus rhythm.  Conclusion: Normal/Unremarkable study with no evidence of significant arrhythmia.   Transthoracic echocardiogram June 24, 2020 IMPRESSIONS    1. Left ventricular ejection fraction, by estimation, is 60 to 65%. The  left ventricle has normal function. The left ventricle has no regional  wall motion abnormalities. There is mild left ventricular hypertrophy.  Left ventricular diastolic parameters  are consistent with Grade II diastolic dysfunction (pseudonormalization).  2. Right ventricular systolic function is normal. The right ventricular  size is normal. There is mildly elevated pulmonary artery systolic  pressure.  3. The mitral valve is normal in structure. No evidence of mitral valve  regurgitation. No evidence of mitral stenosis.  4. The aortic  valve is normal in structure. Aortic valve regurgitation is  not visualized. No aortic stenosis is present.  5. The inferior vena cava is normal in size with greater than 50%  respiratory variability, suggesting right atrial pressure of 3 mmHg.   Recent Labs: 06/19/2020: BUN 25; Creatinine, Ser 0.67; Potassium 4.3; Sodium 143  Recent Lipid Panel No results found for: CHOL, TRIG, HDL, CHOLHDL, VLDL, LDLCALC, LDLDIRECT  Physical Exam:    VS:  BP 120/72   Pulse 62   Ht 5\' 1"  (1.549 m)   Wt 157 lb (71.2 kg)   SpO2 94%   BMI 29.66 kg/m     Wt Readings from Last 3 Encounters:  09/04/20 157 lb (71.2 kg)  06/12/20 155 lb 9.6 oz (70.6 kg)     GEN: Well nourished, well developed in no acute distress HEENT: Normal NECK: No JVD; No carotid bruits LYMPHATICS: No lymphadenopathy CARDIAC: S1S2 noted,RRR, no murmurs, rubs, gallops RESPIRATORY:  Clear to auscultation without rales, wheezing or rhonchi  ABDOMEN: Soft, non-tender, non-distended, +bowel sounds, no guarding. EXTREMITIES: No edema, No cyanosis, no clubbing MUSCULOSKELETAL:  No deformity  SKIN: Warm and dry NEUROLOGIC:  Alert and oriented x 3, non-focal PSYCHIATRIC:  Normal affect, good insight  ASSESSMENT:    1. Hypertension, unspecified type   2. Type 2 diabetes mellitus without complication, without long-term current use of insulin ([place])   3. Mixed hyperlipidemia   4. Coronary artery disease involving native coronary artery of native heart without angina pectoris    PLAN:     1.  She appears to be doing well from a cardiovascular standpoint.  No medication changes will be made at this time. 2.  Hyperlipidemia - continue with current statin medication. 3.  No anginal symptoms continue patient her current aspirin and statin medication. 4.  This is being managed by his primary care doctor.  No adjustments for antidiabetic medications were made today.    The patient is in agreement with the above plan. The patient left  the office in stable condition.  The patient will follow up in 1 year or sooner if needed.   Medication Adjustments/Labs and Tests Ordered: Current medicines are reviewed at length with the patient today.  Concerns regarding medicines are outlined above.  No orders of the defined types were placed in this encounter.  No orders of the defined types were placed in this encounter.   Patient Instructions  Medication Instructions:  Your physician recommends that you continue on your current medications as directed. Please refer to the Current Medication list given to you today.  *If you need a refill on your cardiac medications before your next appointment, please call your pharmacy*   Lab Work: NONE  If you have labs (blood work) drawn today and your tests are completely normal, you will receive your results only by: Marland Kitchen MyChart Message (if you have MyChart) OR . A paper copy in the mail If you have any lab test that is abnormal or we need to change your treatment, we will call you to review the results.   Testing/Procedures: NONE   Follow-Up: At [facility], you and your health needs are our priority.  As part of our continuing mission to provide you with exceptional heart care, we have created designated Provider Care Teams.  These Care Teams include your primary Cardiologist (physician) and Advanced Practice Providers (APPs -  Physician Assistants and Nurse Practitioners) who all work together to provide you with the care you need, when you need it.  We recommend signing up for the patient portal called "MyChart".  Sign up information is provided on this After Visit Summary.  MyChart is used to connect with patients for Virtual Visits (Telemedicine).  Patients are able to view lab/test results, encounter notes, upcoming appointments, etc.  Non-urgent messages can be sent to your provider as well.   To learn more about what you can do with MyChart, go to [email or web address].     Your next appointment:   1 year(s)  The format for your next appointment:   In Person  Provider:   Berniece Salines, DO   Other Instructions      Adopting a Healthy Lifestyle.  Know what a healthy weight is for you (roughly BMI <25) and aim to maintain this   Aim for 7+ servings of fruits and vegetables daily   65-80+ fluid ounces of water or unsweet tea for healthy kidneys   Limit to max 1 drink of alcohol per day; avoid smoking/tobacco   Limit animal fats in diet for cholesterol and heart health - choose grass fed whenever available   Avoid highly processed foods, and foods high in saturated/trans fats   Aim for low stress - take time to unwind and care for your mental health   Aim for 150 min of moderate intensity exercise weekly for heart health, and weights twice weekly for bone health   Aim for 7-9 hours of sleep daily   When it comes to diets, agreement about the perfect plan isnt easy to find, even among the experts. Experts at the [employer] developed an idea known as the Healthy Eating Plate. Just imagine a plate divided into logical, healthy portions.   The emphasis is on diet quality:   Load up on vegetables and fruits - one-half of your plate: Aim for color and variety, and remember that potatoes dont count.   Go for whole grains - one-quarter of your plate: Whole wheat, barley, wheat berries, quinoa, oats, brown rice, and foods made with them. If you want pasta, go with whole wheat pasta.   Protein power - one-quarter of your plate: Fish, chicken, beans, and nuts are all healthy, versatile protein sources. Limit red meat.   The diet, however, does go beyond the plate, offering a few other suggestions.   Use healthy plant oils, such as olive, canola, soy, corn, sunflower and peanut. Check the labels, and avoid partially hydrogenated oil, which have unhealthy trans fats.   If youre thirsty, drink water. Coffee and tea are good in  moderation, but skip sugary drinks and limit milk and dairy products to one or two daily servings.   The type of carbohydrate in the diet  is more important than the amount. Some sources of carbohydrates, such as vegetables, fruits, whole grains, and beans-are healthier than others.   Finally, stay active  Signed, Berniece Salines, DO  09/04/2020 11:31 AM    [place]

## 2020-10-09 ENCOUNTER — Telehealth: Payer: Self-pay | Admitting: *Deleted

## 2020-10-09 DIAGNOSIS — Z006 Encounter for examination for normal comparison and control in clinical research program: Secondary | ICD-10-CM

## 2020-10-09 NOTE — Telephone Encounter (Signed)
Patient returned my call for Identify Study. Patient is doing well with no cardiac symptoms. I reminded patient I would call her back in January for 1-year follow-up.

## 2020-10-09 NOTE — Telephone Encounter (Signed)
I called patient for 90 day Identify study phone call. I spoke to husband who will give her a message.. I will e-mail her also.

## 2020-10-10 DIAGNOSIS — E1169 Type 2 diabetes mellitus with other specified complication: Secondary | ICD-10-CM | POA: Diagnosis not present

## 2020-10-10 DIAGNOSIS — E785 Hyperlipidemia, unspecified: Secondary | ICD-10-CM | POA: Diagnosis not present

## 2020-10-16 DIAGNOSIS — E782 Mixed hyperlipidemia: Secondary | ICD-10-CM | POA: Diagnosis not present

## 2020-10-16 DIAGNOSIS — E1169 Type 2 diabetes mellitus with other specified complication: Secondary | ICD-10-CM | POA: Diagnosis not present

## 2020-10-16 DIAGNOSIS — E785 Hyperlipidemia, unspecified: Secondary | ICD-10-CM | POA: Diagnosis not present

## 2020-10-16 DIAGNOSIS — I2510 Atherosclerotic heart disease of native coronary artery without angina pectoris: Secondary | ICD-10-CM | POA: Diagnosis not present

## 2020-10-16 DIAGNOSIS — I10 Essential (primary) hypertension: Secondary | ICD-10-CM | POA: Diagnosis not present

## 2020-10-16 DIAGNOSIS — Z6829 Body mass index (BMI) 29.0-29.9, adult: Secondary | ICD-10-CM | POA: Diagnosis not present

## 2020-10-27 DIAGNOSIS — K219 Gastro-esophageal reflux disease without esophagitis: Secondary | ICD-10-CM | POA: Diagnosis not present

## 2020-10-27 DIAGNOSIS — E1169 Type 2 diabetes mellitus with other specified complication: Secondary | ICD-10-CM | POA: Diagnosis not present

## 2020-10-27 DIAGNOSIS — E782 Mixed hyperlipidemia: Secondary | ICD-10-CM | POA: Diagnosis not present

## 2020-10-27 DIAGNOSIS — I10 Essential (primary) hypertension: Secondary | ICD-10-CM | POA: Diagnosis not present

## 2020-11-27 DIAGNOSIS — I10 Essential (primary) hypertension: Secondary | ICD-10-CM | POA: Diagnosis not present

## 2020-11-27 DIAGNOSIS — E1169 Type 2 diabetes mellitus with other specified complication: Secondary | ICD-10-CM | POA: Diagnosis not present

## 2020-11-27 DIAGNOSIS — K219 Gastro-esophageal reflux disease without esophagitis: Secondary | ICD-10-CM | POA: Diagnosis not present

## 2020-11-27 DIAGNOSIS — E782 Mixed hyperlipidemia: Secondary | ICD-10-CM | POA: Diagnosis not present

## 2020-12-28 DIAGNOSIS — K219 Gastro-esophageal reflux disease without esophagitis: Secondary | ICD-10-CM | POA: Diagnosis not present

## 2020-12-28 DIAGNOSIS — E1169 Type 2 diabetes mellitus with other specified complication: Secondary | ICD-10-CM | POA: Diagnosis not present

## 2020-12-28 DIAGNOSIS — I10 Essential (primary) hypertension: Secondary | ICD-10-CM | POA: Diagnosis not present

## 2020-12-28 DIAGNOSIS — E782 Mixed hyperlipidemia: Secondary | ICD-10-CM | POA: Diagnosis not present

## 2021-01-08 DIAGNOSIS — L821 Other seborrheic keratosis: Secondary | ICD-10-CM | POA: Diagnosis not present

## 2021-01-08 DIAGNOSIS — L570 Actinic keratosis: Secondary | ICD-10-CM | POA: Diagnosis not present

## 2021-01-14 DIAGNOSIS — N390 Urinary tract infection, site not specified: Secondary | ICD-10-CM | POA: Diagnosis not present

## 2021-01-14 DIAGNOSIS — N952 Postmenopausal atrophic vaginitis: Secondary | ICD-10-CM | POA: Diagnosis not present

## 2021-01-14 DIAGNOSIS — R339 Retention of urine, unspecified: Secondary | ICD-10-CM | POA: Diagnosis not present

## 2021-01-14 DIAGNOSIS — R3911 Hesitancy of micturition: Secondary | ICD-10-CM | POA: Diagnosis not present

## 2021-01-28 DIAGNOSIS — E782 Mixed hyperlipidemia: Secondary | ICD-10-CM | POA: Diagnosis not present

## 2021-01-28 DIAGNOSIS — I10 Essential (primary) hypertension: Secondary | ICD-10-CM | POA: Diagnosis not present

## 2021-01-28 DIAGNOSIS — E1169 Type 2 diabetes mellitus with other specified complication: Secondary | ICD-10-CM | POA: Diagnosis not present

## 2021-01-28 DIAGNOSIS — K219 Gastro-esophageal reflux disease without esophagitis: Secondary | ICD-10-CM | POA: Diagnosis not present

## 2021-03-10 DIAGNOSIS — E1169 Type 2 diabetes mellitus with other specified complication: Secondary | ICD-10-CM | POA: Diagnosis not present

## 2021-03-10 DIAGNOSIS — E785 Hyperlipidemia, unspecified: Secondary | ICD-10-CM | POA: Diagnosis not present

## 2021-03-10 DIAGNOSIS — Z0000 Encounter for general adult medical examination without abnormal findings: Secondary | ICD-10-CM | POA: Diagnosis not present

## 2021-03-13 DIAGNOSIS — Z1331 Encounter for screening for depression: Secondary | ICD-10-CM | POA: Diagnosis not present

## 2021-03-13 DIAGNOSIS — J40 Bronchitis, not specified as acute or chronic: Secondary | ICD-10-CM | POA: Diagnosis not present

## 2021-03-13 DIAGNOSIS — E785 Hyperlipidemia, unspecified: Secondary | ICD-10-CM | POA: Diagnosis not present

## 2021-03-13 DIAGNOSIS — I2510 Atherosclerotic heart disease of native coronary artery without angina pectoris: Secondary | ICD-10-CM | POA: Diagnosis not present

## 2021-03-13 DIAGNOSIS — Z0000 Encounter for general adult medical examination without abnormal findings: Secondary | ICD-10-CM | POA: Diagnosis not present

## 2021-03-13 DIAGNOSIS — I10 Essential (primary) hypertension: Secondary | ICD-10-CM | POA: Diagnosis not present

## 2021-03-13 DIAGNOSIS — F411 Generalized anxiety disorder: Secondary | ICD-10-CM | POA: Diagnosis not present

## 2021-03-13 DIAGNOSIS — E782 Mixed hyperlipidemia: Secondary | ICD-10-CM | POA: Diagnosis not present

## 2021-03-13 DIAGNOSIS — J329 Chronic sinusitis, unspecified: Secondary | ICD-10-CM | POA: Diagnosis not present

## 2021-03-13 DIAGNOSIS — E1169 Type 2 diabetes mellitus with other specified complication: Secondary | ICD-10-CM | POA: Diagnosis not present

## 2021-03-13 DIAGNOSIS — M159 Polyosteoarthritis, unspecified: Secondary | ICD-10-CM | POA: Diagnosis not present

## 2021-03-13 DIAGNOSIS — K219 Gastro-esophageal reflux disease without esophagitis: Secondary | ICD-10-CM | POA: Diagnosis not present

## 2021-03-30 DIAGNOSIS — E1169 Type 2 diabetes mellitus with other specified complication: Secondary | ICD-10-CM | POA: Diagnosis not present

## 2021-03-30 DIAGNOSIS — I10 Essential (primary) hypertension: Secondary | ICD-10-CM | POA: Diagnosis not present

## 2021-04-03 DIAGNOSIS — Z23 Encounter for immunization: Secondary | ICD-10-CM | POA: Diagnosis not present

## 2021-04-15 DIAGNOSIS — J069 Acute upper respiratory infection, unspecified: Secondary | ICD-10-CM | POA: Diagnosis not present

## 2021-04-15 DIAGNOSIS — Z6829 Body mass index (BMI) 29.0-29.9, adult: Secondary | ICD-10-CM | POA: Diagnosis not present

## 2021-04-15 DIAGNOSIS — E785 Hyperlipidemia, unspecified: Secondary | ICD-10-CM | POA: Diagnosis not present

## 2021-04-15 DIAGNOSIS — E1169 Type 2 diabetes mellitus with other specified complication: Secondary | ICD-10-CM | POA: Diagnosis not present

## 2021-04-15 DIAGNOSIS — Z20828 Contact with and (suspected) exposure to other viral communicable diseases: Secondary | ICD-10-CM | POA: Diagnosis not present

## 2021-04-29 DIAGNOSIS — I10 Essential (primary) hypertension: Secondary | ICD-10-CM | POA: Diagnosis not present

## 2021-04-29 DIAGNOSIS — E1169 Type 2 diabetes mellitus with other specified complication: Secondary | ICD-10-CM | POA: Diagnosis not present

## 2021-06-18 ENCOUNTER — Other Ambulatory Visit: Payer: Self-pay | Admitting: Cardiology

## 2021-06-19 DIAGNOSIS — F419 Anxiety disorder, unspecified: Secondary | ICD-10-CM | POA: Diagnosis not present

## 2021-06-19 DIAGNOSIS — I10 Essential (primary) hypertension: Secondary | ICD-10-CM | POA: Diagnosis not present

## 2021-06-19 DIAGNOSIS — I700 Atherosclerosis of aorta: Secondary | ICD-10-CM | POA: Diagnosis not present

## 2021-06-19 DIAGNOSIS — Z7982 Long term (current) use of aspirin: Secondary | ICD-10-CM | POA: Diagnosis not present

## 2021-06-19 DIAGNOSIS — E1142 Type 2 diabetes mellitus with diabetic polyneuropathy: Secondary | ICD-10-CM | POA: Diagnosis not present

## 2021-06-19 DIAGNOSIS — E785 Hyperlipidemia, unspecified: Secondary | ICD-10-CM | POA: Diagnosis not present

## 2021-06-19 DIAGNOSIS — Z7984 Long term (current) use of oral hypoglycemic drugs: Secondary | ICD-10-CM | POA: Diagnosis not present

## 2021-06-30 DIAGNOSIS — E782 Mixed hyperlipidemia: Secondary | ICD-10-CM | POA: Diagnosis not present

## 2021-06-30 DIAGNOSIS — I10 Essential (primary) hypertension: Secondary | ICD-10-CM | POA: Diagnosis not present

## 2021-07-17 DIAGNOSIS — N952 Postmenopausal atrophic vaginitis: Secondary | ICD-10-CM | POA: Diagnosis not present

## 2021-07-17 DIAGNOSIS — R3911 Hesitancy of micturition: Secondary | ICD-10-CM | POA: Diagnosis not present

## 2021-07-17 DIAGNOSIS — N390 Urinary tract infection, site not specified: Secondary | ICD-10-CM | POA: Diagnosis not present

## 2021-07-17 DIAGNOSIS — R339 Retention of urine, unspecified: Secondary | ICD-10-CM | POA: Diagnosis not present

## 2021-07-17 DIAGNOSIS — B3731 Acute candidiasis of vulva and vagina: Secondary | ICD-10-CM | POA: Diagnosis not present

## 2021-08-04 DIAGNOSIS — L821 Other seborrheic keratosis: Secondary | ICD-10-CM | POA: Diagnosis not present

## 2021-08-04 DIAGNOSIS — L570 Actinic keratosis: Secondary | ICD-10-CM | POA: Diagnosis not present

## 2021-08-04 DIAGNOSIS — L578 Other skin changes due to chronic exposure to nonionizing radiation: Secondary | ICD-10-CM | POA: Diagnosis not present

## 2021-08-05 DIAGNOSIS — E119 Type 2 diabetes mellitus without complications: Secondary | ICD-10-CM | POA: Diagnosis not present

## 2021-08-25 DIAGNOSIS — Z7984 Long term (current) use of oral hypoglycemic drugs: Secondary | ICD-10-CM | POA: Diagnosis not present

## 2021-08-25 DIAGNOSIS — E785 Hyperlipidemia, unspecified: Secondary | ICD-10-CM | POA: Diagnosis not present

## 2021-08-25 DIAGNOSIS — I700 Atherosclerosis of aorta: Secondary | ICD-10-CM | POA: Diagnosis not present

## 2021-08-25 DIAGNOSIS — I10 Essential (primary) hypertension: Secondary | ICD-10-CM | POA: Diagnosis not present

## 2021-08-25 DIAGNOSIS — F419 Anxiety disorder, unspecified: Secondary | ICD-10-CM | POA: Diagnosis not present

## 2021-08-25 DIAGNOSIS — E1142 Type 2 diabetes mellitus with diabetic polyneuropathy: Secondary | ICD-10-CM | POA: Diagnosis not present

## 2021-08-25 DIAGNOSIS — Z7982 Long term (current) use of aspirin: Secondary | ICD-10-CM | POA: Diagnosis not present

## 2021-09-27 DIAGNOSIS — E1169 Type 2 diabetes mellitus with other specified complication: Secondary | ICD-10-CM | POA: Diagnosis not present

## 2021-09-27 DIAGNOSIS — I10 Essential (primary) hypertension: Secondary | ICD-10-CM | POA: Diagnosis not present

## 2021-10-27 DIAGNOSIS — E782 Mixed hyperlipidemia: Secondary | ICD-10-CM | POA: Diagnosis not present

## 2021-10-27 DIAGNOSIS — Z79899 Other long term (current) drug therapy: Secondary | ICD-10-CM | POA: Diagnosis not present

## 2021-10-29 DIAGNOSIS — I2510 Atherosclerotic heart disease of native coronary artery without angina pectoris: Secondary | ICD-10-CM | POA: Diagnosis not present

## 2021-10-29 DIAGNOSIS — E785 Hyperlipidemia, unspecified: Secondary | ICD-10-CM | POA: Diagnosis not present

## 2021-10-29 DIAGNOSIS — M5440 Lumbago with sciatica, unspecified side: Secondary | ICD-10-CM | POA: Diagnosis not present

## 2021-10-29 DIAGNOSIS — Z9181 History of falling: Secondary | ICD-10-CM | POA: Diagnosis not present

## 2021-10-29 DIAGNOSIS — I10 Essential (primary) hypertension: Secondary | ICD-10-CM | POA: Diagnosis not present

## 2021-10-29 DIAGNOSIS — Z6828 Body mass index (BMI) 28.0-28.9, adult: Secondary | ICD-10-CM | POA: Diagnosis not present

## 2021-10-29 DIAGNOSIS — E782 Mixed hyperlipidemia: Secondary | ICD-10-CM | POA: Diagnosis not present

## 2021-10-29 DIAGNOSIS — E1169 Type 2 diabetes mellitus with other specified complication: Secondary | ICD-10-CM | POA: Diagnosis not present

## 2021-11-27 DIAGNOSIS — I10 Essential (primary) hypertension: Secondary | ICD-10-CM | POA: Diagnosis not present

## 2021-11-27 DIAGNOSIS — E1169 Type 2 diabetes mellitus with other specified complication: Secondary | ICD-10-CM | POA: Diagnosis not present

## 2022-02-25 DIAGNOSIS — E1142 Type 2 diabetes mellitus with diabetic polyneuropathy: Secondary | ICD-10-CM | POA: Diagnosis not present

## 2022-02-25 DIAGNOSIS — Z6826 Body mass index (BMI) 26.0-26.9, adult: Secondary | ICD-10-CM | POA: Diagnosis not present

## 2022-02-25 DIAGNOSIS — I10 Essential (primary) hypertension: Secondary | ICD-10-CM | POA: Diagnosis not present

## 2022-02-25 DIAGNOSIS — Z7984 Long term (current) use of oral hypoglycemic drugs: Secondary | ICD-10-CM | POA: Diagnosis not present

## 2022-02-27 DIAGNOSIS — I10 Essential (primary) hypertension: Secondary | ICD-10-CM | POA: Diagnosis not present

## 2022-02-27 DIAGNOSIS — E1169 Type 2 diabetes mellitus with other specified complication: Secondary | ICD-10-CM | POA: Diagnosis not present

## 2022-03-10 DIAGNOSIS — E1169 Type 2 diabetes mellitus with other specified complication: Secondary | ICD-10-CM | POA: Diagnosis not present

## 2022-03-10 DIAGNOSIS — E785 Hyperlipidemia, unspecified: Secondary | ICD-10-CM | POA: Diagnosis not present

## 2022-03-10 DIAGNOSIS — E782 Mixed hyperlipidemia: Secondary | ICD-10-CM | POA: Diagnosis not present

## 2022-03-18 DIAGNOSIS — E1169 Type 2 diabetes mellitus with other specified complication: Secondary | ICD-10-CM | POA: Diagnosis not present

## 2022-03-18 DIAGNOSIS — Z23 Encounter for immunization: Secondary | ICD-10-CM | POA: Diagnosis not present

## 2022-03-18 DIAGNOSIS — M159 Polyosteoarthritis, unspecified: Secondary | ICD-10-CM | POA: Diagnosis not present

## 2022-03-18 DIAGNOSIS — E782 Mixed hyperlipidemia: Secondary | ICD-10-CM | POA: Diagnosis not present

## 2022-03-18 DIAGNOSIS — I10 Essential (primary) hypertension: Secondary | ICD-10-CM | POA: Diagnosis not present

## 2022-03-18 DIAGNOSIS — Z1331 Encounter for screening for depression: Secondary | ICD-10-CM | POA: Diagnosis not present

## 2022-03-18 DIAGNOSIS — E785 Hyperlipidemia, unspecified: Secondary | ICD-10-CM | POA: Diagnosis not present

## 2022-03-18 DIAGNOSIS — Z0000 Encounter for general adult medical examination without abnormal findings: Secondary | ICD-10-CM | POA: Diagnosis not present

## 2022-03-23 DIAGNOSIS — L570 Actinic keratosis: Secondary | ICD-10-CM | POA: Diagnosis not present

## 2022-03-23 DIAGNOSIS — L300 Nummular dermatitis: Secondary | ICD-10-CM | POA: Diagnosis not present

## 2022-03-23 DIAGNOSIS — L720 Epidermal cyst: Secondary | ICD-10-CM | POA: Diagnosis not present

## 2022-03-23 DIAGNOSIS — L821 Other seborrheic keratosis: Secondary | ICD-10-CM | POA: Diagnosis not present

## 2022-03-23 DIAGNOSIS — L578 Other skin changes due to chronic exposure to nonionizing radiation: Secondary | ICD-10-CM | POA: Diagnosis not present

## 2022-04-27 DIAGNOSIS — Z6827 Body mass index (BMI) 27.0-27.9, adult: Secondary | ICD-10-CM | POA: Diagnosis not present

## 2022-04-27 DIAGNOSIS — F4321 Adjustment disorder with depressed mood: Secondary | ICD-10-CM | POA: Diagnosis not present

## 2022-08-09 DIAGNOSIS — H5203 Hypermetropia, bilateral: Secondary | ICD-10-CM | POA: Diagnosis not present

## 2022-08-09 DIAGNOSIS — H18523 Epithelial (juvenile) corneal dystrophy, bilateral: Secondary | ICD-10-CM | POA: Diagnosis not present

## 2022-08-09 DIAGNOSIS — M3501 Sicca syndrome with keratoconjunctivitis: Secondary | ICD-10-CM | POA: Diagnosis not present

## 2022-08-09 DIAGNOSIS — E119 Type 2 diabetes mellitus without complications: Secondary | ICD-10-CM | POA: Diagnosis not present

## 2022-08-09 DIAGNOSIS — H25813 Combined forms of age-related cataract, bilateral: Secondary | ICD-10-CM | POA: Diagnosis not present

## 2022-08-10 DIAGNOSIS — M5442 Lumbago with sciatica, left side: Secondary | ICD-10-CM | POA: Diagnosis not present

## 2022-09-13 DIAGNOSIS — I700 Atherosclerosis of aorta: Secondary | ICD-10-CM | POA: Diagnosis not present

## 2022-09-13 DIAGNOSIS — Z6825 Body mass index (BMI) 25.0-25.9, adult: Secondary | ICD-10-CM | POA: Diagnosis not present

## 2022-09-13 DIAGNOSIS — Z7984 Long term (current) use of oral hypoglycemic drugs: Secondary | ICD-10-CM | POA: Diagnosis not present

## 2022-09-13 DIAGNOSIS — M5432 Sciatica, left side: Secondary | ICD-10-CM | POA: Diagnosis not present

## 2022-09-13 DIAGNOSIS — Z7982 Long term (current) use of aspirin: Secondary | ICD-10-CM | POA: Diagnosis not present

## 2022-09-13 DIAGNOSIS — E1142 Type 2 diabetes mellitus with diabetic polyneuropathy: Secondary | ICD-10-CM | POA: Diagnosis not present

## 2022-09-13 DIAGNOSIS — E785 Hyperlipidemia, unspecified: Secondary | ICD-10-CM | POA: Diagnosis not present

## 2022-09-13 DIAGNOSIS — I10 Essential (primary) hypertension: Secondary | ICD-10-CM | POA: Diagnosis not present

## 2022-09-16 DIAGNOSIS — Z1339 Encounter for screening examination for other mental health and behavioral disorders: Secondary | ICD-10-CM | POA: Diagnosis not present

## 2022-09-16 DIAGNOSIS — Z6826 Body mass index (BMI) 26.0-26.9, adult: Secondary | ICD-10-CM | POA: Diagnosis not present

## 2022-09-16 DIAGNOSIS — M5442 Lumbago with sciatica, left side: Secondary | ICD-10-CM | POA: Diagnosis not present

## 2022-10-21 DIAGNOSIS — M5416 Radiculopathy, lumbar region: Secondary | ICD-10-CM | POA: Diagnosis not present

## 2022-11-02 DIAGNOSIS — M5126 Other intervertebral disc displacement, lumbar region: Secondary | ICD-10-CM | POA: Diagnosis not present

## 2022-11-02 DIAGNOSIS — M5416 Radiculopathy, lumbar region: Secondary | ICD-10-CM | POA: Diagnosis not present

## 2022-11-02 DIAGNOSIS — M4316 Spondylolisthesis, lumbar region: Secondary | ICD-10-CM | POA: Diagnosis not present

## 2022-11-02 DIAGNOSIS — M48061 Spinal stenosis, lumbar region without neurogenic claudication: Secondary | ICD-10-CM | POA: Diagnosis not present

## 2022-11-04 DIAGNOSIS — M5416 Radiculopathy, lumbar region: Secondary | ICD-10-CM | POA: Diagnosis not present

## 2022-12-10 DIAGNOSIS — M5416 Radiculopathy, lumbar region: Secondary | ICD-10-CM | POA: Diagnosis not present

## 2022-12-10 DIAGNOSIS — M545 Low back pain, unspecified: Secondary | ICD-10-CM | POA: Diagnosis not present

## 2022-12-20 DIAGNOSIS — E1169 Type 2 diabetes mellitus with other specified complication: Secondary | ICD-10-CM | POA: Diagnosis not present

## 2022-12-20 DIAGNOSIS — E785 Hyperlipidemia, unspecified: Secondary | ICD-10-CM | POA: Diagnosis not present

## 2022-12-20 DIAGNOSIS — M5442 Lumbago with sciatica, left side: Secondary | ICD-10-CM | POA: Diagnosis not present

## 2022-12-20 DIAGNOSIS — Z6826 Body mass index (BMI) 26.0-26.9, adult: Secondary | ICD-10-CM | POA: Diagnosis not present

## 2023-01-26 DIAGNOSIS — F411 Generalized anxiety disorder: Secondary | ICD-10-CM | POA: Diagnosis not present

## 2023-01-26 DIAGNOSIS — H04123 Dry eye syndrome of bilateral lacrimal glands: Secondary | ICD-10-CM | POA: Diagnosis not present

## 2023-01-26 DIAGNOSIS — H259 Unspecified age-related cataract: Secondary | ICD-10-CM | POA: Diagnosis not present

## 2023-01-26 DIAGNOSIS — F3342 Major depressive disorder, recurrent, in full remission: Secondary | ICD-10-CM | POA: Diagnosis not present

## 2023-01-26 DIAGNOSIS — E1169 Type 2 diabetes mellitus with other specified complication: Secondary | ICD-10-CM | POA: Diagnosis not present

## 2023-01-26 DIAGNOSIS — G8929 Other chronic pain: Secondary | ICD-10-CM | POA: Diagnosis not present

## 2023-01-26 DIAGNOSIS — E1142 Type 2 diabetes mellitus with diabetic polyneuropathy: Secondary | ICD-10-CM | POA: Diagnosis not present

## 2023-01-26 DIAGNOSIS — E559 Vitamin D deficiency, unspecified: Secondary | ICD-10-CM | POA: Diagnosis not present

## 2023-01-26 DIAGNOSIS — E785 Hyperlipidemia, unspecified: Secondary | ICD-10-CM | POA: Diagnosis not present

## 2023-01-26 DIAGNOSIS — F419 Anxiety disorder, unspecified: Secondary | ICD-10-CM | POA: Diagnosis not present

## 2023-01-26 DIAGNOSIS — E663 Overweight: Secondary | ICD-10-CM | POA: Diagnosis not present

## 2023-01-26 DIAGNOSIS — D509 Iron deficiency anemia, unspecified: Secondary | ICD-10-CM | POA: Diagnosis not present

## 2023-03-30 DIAGNOSIS — G8929 Other chronic pain: Secondary | ICD-10-CM | POA: Diagnosis not present

## 2023-03-30 DIAGNOSIS — Z9181 History of falling: Secondary | ICD-10-CM | POA: Diagnosis not present

## 2023-03-30 DIAGNOSIS — Z23 Encounter for immunization: Secondary | ICD-10-CM | POA: Diagnosis not present

## 2023-03-30 DIAGNOSIS — E785 Hyperlipidemia, unspecified: Secondary | ICD-10-CM | POA: Diagnosis not present

## 2023-03-30 DIAGNOSIS — M549 Dorsalgia, unspecified: Secondary | ICD-10-CM | POA: Diagnosis not present

## 2023-03-30 DIAGNOSIS — Z0000 Encounter for general adult medical examination without abnormal findings: Secondary | ICD-10-CM | POA: Diagnosis not present

## 2023-03-30 DIAGNOSIS — Z79899 Other long term (current) drug therapy: Secondary | ICD-10-CM | POA: Diagnosis not present

## 2023-03-30 DIAGNOSIS — F411 Generalized anxiety disorder: Secondary | ICD-10-CM | POA: Diagnosis not present

## 2023-03-30 DIAGNOSIS — Z1331 Encounter for screening for depression: Secondary | ICD-10-CM | POA: Diagnosis not present

## 2023-03-30 DIAGNOSIS — E1169 Type 2 diabetes mellitus with other specified complication: Secondary | ICD-10-CM | POA: Diagnosis not present

## 2023-03-30 DIAGNOSIS — Z1339 Encounter for screening examination for other mental health and behavioral disorders: Secondary | ICD-10-CM | POA: Diagnosis not present

## 2023-03-30 DIAGNOSIS — R300 Dysuria: Secondary | ICD-10-CM | POA: Diagnosis not present

## 2023-09-12 DIAGNOSIS — H524 Presbyopia: Secondary | ICD-10-CM | POA: Diagnosis not present

## 2023-09-12 DIAGNOSIS — E119 Type 2 diabetes mellitus without complications: Secondary | ICD-10-CM | POA: Diagnosis not present

## 2023-10-05 DIAGNOSIS — E1169 Type 2 diabetes mellitus with other specified complication: Secondary | ICD-10-CM | POA: Diagnosis not present

## 2023-10-05 DIAGNOSIS — E785 Hyperlipidemia, unspecified: Secondary | ICD-10-CM | POA: Diagnosis not present

## 2023-10-05 DIAGNOSIS — N6002 Solitary cyst of left breast: Secondary | ICD-10-CM | POA: Diagnosis not present

## 2023-10-05 DIAGNOSIS — Z6827 Body mass index (BMI) 27.0-27.9, adult: Secondary | ICD-10-CM | POA: Diagnosis not present

## 2023-10-05 DIAGNOSIS — E782 Mixed hyperlipidemia: Secondary | ICD-10-CM | POA: Diagnosis not present

## 2024-03-26 DIAGNOSIS — E785 Hyperlipidemia, unspecified: Secondary | ICD-10-CM | POA: Diagnosis not present

## 2024-03-26 DIAGNOSIS — E1169 Type 2 diabetes mellitus with other specified complication: Secondary | ICD-10-CM | POA: Diagnosis not present

## 2024-04-02 DIAGNOSIS — Z1331 Encounter for screening for depression: Secondary | ICD-10-CM | POA: Diagnosis not present

## 2024-04-02 DIAGNOSIS — Z1339 Encounter for screening examination for other mental health and behavioral disorders: Secondary | ICD-10-CM | POA: Diagnosis not present

## 2024-04-02 DIAGNOSIS — Z6826 Body mass index (BMI) 26.0-26.9, adult: Secondary | ICD-10-CM | POA: Diagnosis not present

## 2024-04-02 DIAGNOSIS — E1169 Type 2 diabetes mellitus with other specified complication: Secondary | ICD-10-CM | POA: Diagnosis not present

## 2024-04-02 DIAGNOSIS — Z0000 Encounter for general adult medical examination without abnormal findings: Secondary | ICD-10-CM | POA: Diagnosis not present

## 2024-04-02 DIAGNOSIS — E782 Mixed hyperlipidemia: Secondary | ICD-10-CM | POA: Diagnosis not present

## 2024-04-02 DIAGNOSIS — F411 Generalized anxiety disorder: Secondary | ICD-10-CM | POA: Diagnosis not present

## 2024-04-02 DIAGNOSIS — I10 Essential (primary) hypertension: Secondary | ICD-10-CM | POA: Diagnosis not present

## 2024-04-02 DIAGNOSIS — Z23 Encounter for immunization: Secondary | ICD-10-CM | POA: Diagnosis not present

## 2024-04-02 DIAGNOSIS — Z9181 History of falling: Secondary | ICD-10-CM | POA: Diagnosis not present

## 2024-04-02 DIAGNOSIS — E785 Hyperlipidemia, unspecified: Secondary | ICD-10-CM | POA: Diagnosis not present

## 2024-04-09 ENCOUNTER — Emergency Department (HOSPITAL_COMMUNITY): Payer: Self-pay

## 2024-04-09 ENCOUNTER — Encounter (HOSPITAL_COMMUNITY): Payer: Self-pay | Admitting: General Surgery

## 2024-04-09 ENCOUNTER — Inpatient Hospital Stay (HOSPITAL_COMMUNITY): Payer: Self-pay

## 2024-04-09 ENCOUNTER — Inpatient Hospital Stay (HOSPITAL_COMMUNITY)
Admission: EM | Admit: 2024-04-09 | Discharge: 2024-04-22 | DRG: 025 | Disposition: A | Discharge status: Rehabilitation Facility | Payer: Self-pay | Attending: General Surgery | Admitting: General Surgery

## 2024-04-09 ENCOUNTER — Other Ambulatory Visit: Payer: Self-pay

## 2024-04-09 DIAGNOSIS — I952 Hypotension due to drugs: Secondary | ICD-10-CM | POA: Diagnosis not present

## 2024-04-09 DIAGNOSIS — M4319 Spondylolisthesis, multiple sites in spine: Secondary | ICD-10-CM | POA: Diagnosis not present

## 2024-04-09 DIAGNOSIS — I4819 Other persistent atrial fibrillation: Secondary | ICD-10-CM | POA: Diagnosis not present

## 2024-04-09 DIAGNOSIS — A499 Bacterial infection, unspecified: Secondary | ICD-10-CM | POA: Diagnosis not present

## 2024-04-09 DIAGNOSIS — Z7984 Long term (current) use of oral hypoglycemic drugs: Secondary | ICD-10-CM | POA: Diagnosis not present

## 2024-04-09 DIAGNOSIS — S065X0A Traumatic subdural hemorrhage without loss of consciousness, initial encounter: Principal | ICD-10-CM | POA: Diagnosis present

## 2024-04-09 DIAGNOSIS — I619 Nontraumatic intracerebral hemorrhage, unspecified: Secondary | ICD-10-CM | POA: Diagnosis not present

## 2024-04-09 DIAGNOSIS — J989 Respiratory disorder, unspecified: Secondary | ICD-10-CM | POA: Diagnosis not present

## 2024-04-09 DIAGNOSIS — E440 Moderate protein-calorie malnutrition: Secondary | ICD-10-CM | POA: Diagnosis present

## 2024-04-09 DIAGNOSIS — R402132 Coma scale, eyes open, to sound, at arrival to emergency department: Secondary | ICD-10-CM | POA: Diagnosis present

## 2024-04-09 DIAGNOSIS — Z452 Encounter for adjustment and management of vascular access device: Secondary | ICD-10-CM | POA: Diagnosis not present

## 2024-04-09 DIAGNOSIS — R339 Retention of urine, unspecified: Secondary | ICD-10-CM | POA: Diagnosis not present

## 2024-04-09 DIAGNOSIS — S06310A Contusion and laceration of right cerebrum without loss of consciousness, initial encounter: Secondary | ICD-10-CM | POA: Diagnosis not present

## 2024-04-09 DIAGNOSIS — I480 Paroxysmal atrial fibrillation: Secondary | ICD-10-CM | POA: Diagnosis not present

## 2024-04-09 DIAGNOSIS — Z90710 Acquired absence of both cervix and uterus: Secondary | ICD-10-CM | POA: Diagnosis not present

## 2024-04-09 DIAGNOSIS — E785 Hyperlipidemia, unspecified: Secondary | ICD-10-CM | POA: Diagnosis present

## 2024-04-09 DIAGNOSIS — Z79899 Other long term (current) drug therapy: Secondary | ICD-10-CM

## 2024-04-09 DIAGNOSIS — D62 Acute posthemorrhagic anemia: Secondary | ICD-10-CM | POA: Diagnosis not present

## 2024-04-09 DIAGNOSIS — R402232 Coma scale, best verbal response, inappropriate words, at arrival to emergency department: Secondary | ICD-10-CM | POA: Diagnosis not present

## 2024-04-09 DIAGNOSIS — T4275XA Adverse effect of unspecified antiepileptic and sedative-hypnotic drugs, initial encounter: Secondary | ICD-10-CM | POA: Diagnosis not present

## 2024-04-09 DIAGNOSIS — Z043 Encounter for examination and observation following other accident: Secondary | ICD-10-CM | POA: Diagnosis not present

## 2024-04-09 DIAGNOSIS — S065X9A Traumatic subdural hemorrhage with loss of consciousness of unspecified duration, initial encounter: Secondary | ICD-10-CM | POA: Diagnosis not present

## 2024-04-09 DIAGNOSIS — K589 Irritable bowel syndrome without diarrhea: Secondary | ICD-10-CM | POA: Diagnosis not present

## 2024-04-09 DIAGNOSIS — I6782 Cerebral ischemia: Secondary | ICD-10-CM | POA: Diagnosis not present

## 2024-04-09 DIAGNOSIS — I10 Essential (primary) hypertension: Secondary | ICD-10-CM | POA: Diagnosis not present

## 2024-04-09 DIAGNOSIS — E119 Type 2 diabetes mellitus without complications: Secondary | ICD-10-CM | POA: Diagnosis not present

## 2024-04-09 DIAGNOSIS — E8770 Fluid overload, unspecified: Secondary | ICD-10-CM | POA: Diagnosis not present

## 2024-04-09 DIAGNOSIS — Z6822 Body mass index (BMI) 22.0-22.9, adult: Secondary | ICD-10-CM | POA: Diagnosis not present

## 2024-04-09 DIAGNOSIS — R4182 Altered mental status, unspecified: Secondary | ICD-10-CM | POA: Diagnosis not present

## 2024-04-09 DIAGNOSIS — I6201 Nontraumatic acute subdural hemorrhage: Secondary | ICD-10-CM | POA: Diagnosis not present

## 2024-04-09 DIAGNOSIS — J810 Acute pulmonary edema: Secondary | ICD-10-CM | POA: Diagnosis not present

## 2024-04-09 DIAGNOSIS — I609 Nontraumatic subarachnoid hemorrhage, unspecified: Secondary | ICD-10-CM | POA: Diagnosis not present

## 2024-04-09 DIAGNOSIS — R0603 Acute respiratory distress: Secondary | ICD-10-CM | POA: Diagnosis not present

## 2024-04-09 DIAGNOSIS — J811 Chronic pulmonary edema: Secondary | ICD-10-CM | POA: Diagnosis not present

## 2024-04-09 DIAGNOSIS — S0003XA Contusion of scalp, initial encounter: Secondary | ICD-10-CM | POA: Diagnosis not present

## 2024-04-09 DIAGNOSIS — M8580 Other specified disorders of bone density and structure, unspecified site: Secondary | ICD-10-CM | POA: Diagnosis not present

## 2024-04-09 DIAGNOSIS — R220 Localized swelling, mass and lump, head: Secondary | ICD-10-CM | POA: Diagnosis not present

## 2024-04-09 DIAGNOSIS — R4701 Aphasia: Secondary | ICD-10-CM | POA: Diagnosis not present

## 2024-04-09 DIAGNOSIS — R0602 Shortness of breath: Secondary | ICD-10-CM | POA: Diagnosis not present

## 2024-04-09 DIAGNOSIS — R402352 Coma scale, best motor response, localizes pain, at arrival to emergency department: Secondary | ICD-10-CM | POA: Diagnosis present

## 2024-04-09 DIAGNOSIS — J90 Pleural effusion, not elsewhere classified: Secondary | ICD-10-CM | POA: Diagnosis not present

## 2024-04-09 DIAGNOSIS — J690 Pneumonitis due to inhalation of food and vomit: Secondary | ICD-10-CM | POA: Diagnosis not present

## 2024-04-09 DIAGNOSIS — R569 Unspecified convulsions: Secondary | ICD-10-CM | POA: Diagnosis not present

## 2024-04-09 DIAGNOSIS — J9601 Acute respiratory failure with hypoxia: Secondary | ICD-10-CM | POA: Diagnosis not present

## 2024-04-09 DIAGNOSIS — E876 Hypokalemia: Secondary | ICD-10-CM | POA: Diagnosis present

## 2024-04-09 DIAGNOSIS — S06A1XA Traumatic brain compression with herniation, initial encounter: Secondary | ICD-10-CM | POA: Diagnosis present

## 2024-04-09 DIAGNOSIS — S0990XA Unspecified injury of head, initial encounter: Secondary | ICD-10-CM | POA: Diagnosis not present

## 2024-04-09 DIAGNOSIS — K5900 Constipation, unspecified: Secondary | ICD-10-CM | POA: Diagnosis not present

## 2024-04-09 DIAGNOSIS — Z9911 Dependence on respirator [ventilator] status: Secondary | ICD-10-CM | POA: Diagnosis not present

## 2024-04-09 DIAGNOSIS — S299XXA Unspecified injury of thorax, initial encounter: Secondary | ICD-10-CM | POA: Diagnosis not present

## 2024-04-09 DIAGNOSIS — S069XAD Unspecified intracranial injury with loss of consciousness status unknown, subsequent encounter: Secondary | ICD-10-CM | POA: Diagnosis not present

## 2024-04-09 DIAGNOSIS — I959 Hypotension, unspecified: Secondary | ICD-10-CM | POA: Diagnosis not present

## 2024-04-09 DIAGNOSIS — I517 Cardiomegaly: Secondary | ICD-10-CM | POA: Diagnosis not present

## 2024-04-09 DIAGNOSIS — Y92019 Unspecified place in single-family (private) house as the place of occurrence of the external cause: Secondary | ICD-10-CM

## 2024-04-09 DIAGNOSIS — I493 Ventricular premature depolarization: Secondary | ICD-10-CM | POA: Diagnosis not present

## 2024-04-09 DIAGNOSIS — Z434 Encounter for attention to other artificial openings of digestive tract: Secondary | ICD-10-CM | POA: Diagnosis not present

## 2024-04-09 DIAGNOSIS — R300 Dysuria: Secondary | ICD-10-CM | POA: Diagnosis not present

## 2024-04-09 DIAGNOSIS — M4802 Spinal stenosis, cervical region: Secondary | ICD-10-CM | POA: Diagnosis not present

## 2024-04-09 DIAGNOSIS — S065XAA Traumatic subdural hemorrhage with loss of consciousness status unknown, initial encounter: Secondary | ICD-10-CM | POA: Diagnosis not present

## 2024-04-09 DIAGNOSIS — R0989 Other specified symptoms and signs involving the circulatory and respiratory systems: Secondary | ICD-10-CM | POA: Diagnosis not present

## 2024-04-09 DIAGNOSIS — S3993XA Unspecified injury of pelvis, initial encounter: Secondary | ICD-10-CM | POA: Diagnosis not present

## 2024-04-09 DIAGNOSIS — S199XXA Unspecified injury of neck, initial encounter: Secondary | ICD-10-CM | POA: Diagnosis not present

## 2024-04-09 DIAGNOSIS — W19XXXA Unspecified fall, initial encounter: Secondary | ICD-10-CM | POA: Diagnosis present

## 2024-04-09 DIAGNOSIS — D72829 Elevated white blood cell count, unspecified: Secondary | ICD-10-CM | POA: Diagnosis not present

## 2024-04-09 DIAGNOSIS — J9690 Respiratory failure, unspecified, unspecified whether with hypoxia or hypercapnia: Secondary | ICD-10-CM | POA: Diagnosis not present

## 2024-04-09 DIAGNOSIS — M7989 Other specified soft tissue disorders: Secondary | ICD-10-CM | POA: Diagnosis not present

## 2024-04-09 DIAGNOSIS — R918 Other nonspecific abnormal finding of lung field: Secondary | ICD-10-CM | POA: Diagnosis not present

## 2024-04-09 DIAGNOSIS — D649 Anemia, unspecified: Secondary | ICD-10-CM | POA: Diagnosis not present

## 2024-04-09 DIAGNOSIS — I4891 Unspecified atrial fibrillation: Secondary | ICD-10-CM | POA: Diagnosis not present

## 2024-04-09 DIAGNOSIS — I700 Atherosclerosis of aorta: Secondary | ICD-10-CM | POA: Diagnosis not present

## 2024-04-09 DIAGNOSIS — S065XAD Traumatic subdural hemorrhage with loss of consciousness status unknown, subsequent encounter: Secondary | ICD-10-CM | POA: Diagnosis not present

## 2024-04-09 DIAGNOSIS — K219 Gastro-esophageal reflux disease without esophagitis: Secondary | ICD-10-CM | POA: Diagnosis not present

## 2024-04-09 DIAGNOSIS — Z4682 Encounter for fitting and adjustment of non-vascular catheter: Secondary | ICD-10-CM | POA: Diagnosis not present

## 2024-04-09 DIAGNOSIS — Z794 Long term (current) use of insulin: Secondary | ICD-10-CM | POA: Diagnosis not present

## 2024-04-09 DIAGNOSIS — R519 Headache, unspecified: Secondary | ICD-10-CM | POA: Diagnosis not present

## 2024-04-09 DIAGNOSIS — J9620 Acute and chronic respiratory failure, unspecified whether with hypoxia or hypercapnia: Secondary | ICD-10-CM | POA: Diagnosis not present

## 2024-04-09 DIAGNOSIS — F419 Anxiety disorder, unspecified: Secondary | ICD-10-CM | POA: Diagnosis present

## 2024-04-09 DIAGNOSIS — R739 Hyperglycemia, unspecified: Secondary | ICD-10-CM | POA: Diagnosis not present

## 2024-04-09 DIAGNOSIS — N390 Urinary tract infection, site not specified: Secondary | ICD-10-CM | POA: Diagnosis not present

## 2024-04-09 DIAGNOSIS — M19041 Primary osteoarthritis, right hand: Secondary | ICD-10-CM | POA: Diagnosis not present

## 2024-04-09 DIAGNOSIS — B961 Klebsiella pneumoniae [K. pneumoniae] as the cause of diseases classified elsewhere: Secondary | ICD-10-CM | POA: Diagnosis not present

## 2024-04-09 DIAGNOSIS — G9389 Other specified disorders of brain: Secondary | ICD-10-CM | POA: Diagnosis not present

## 2024-04-09 DIAGNOSIS — S3991XA Unspecified injury of abdomen, initial encounter: Secondary | ICD-10-CM | POA: Diagnosis not present

## 2024-04-09 DIAGNOSIS — I6200 Nontraumatic subdural hemorrhage, unspecified: Secondary | ICD-10-CM | POA: Diagnosis not present

## 2024-04-09 DIAGNOSIS — J9691 Respiratory failure, unspecified with hypoxia: Secondary | ICD-10-CM | POA: Diagnosis not present

## 2024-04-09 HISTORY — DX: Type 2 diabetes mellitus without complications: E11.9

## 2024-04-09 HISTORY — DX: Essential (primary) hypertension: I10

## 2024-04-09 LAB — I-STAT ARTERIAL BLOOD GAS, ED
Acid-base deficit: 1 mmol/L (ref 0.0–2.0)
Bicarbonate: 23.1 mmol/L (ref 20.0–28.0)
Calcium, Ion: 1.15 mmol/L (ref 1.15–1.40)
HCT: 37 % (ref 36.0–46.0)
Hemoglobin: 12.6 g/dL (ref 12.0–15.0)
O2 Saturation: 100 %
Patient temperature: 96.3
Potassium: 2.7 mmol/L — CL (ref 3.5–5.1)
Sodium: 137 mmol/L (ref 135–145)
TCO2: 24 mmol/L (ref 22–32)
pCO2 arterial: 34.7 mmHg (ref 32–48)
pH, Arterial: 7.425 (ref 7.35–7.45)
pO2, Arterial: 514 mmHg — ABNORMAL HIGH (ref 83–108)

## 2024-04-09 LAB — I-STAT CHEM 8, ED
BUN: 15 mg/dL (ref 8–23)
Calcium, Ion: 1.13 mmol/L — ABNORMAL LOW (ref 1.15–1.40)
Chloride: 100 mmol/L (ref 98–111)
Creatinine, Ser: 0.4 mg/dL — ABNORMAL LOW (ref 0.44–1.00)
Glucose, Bld: 220 mg/dL — ABNORMAL HIGH (ref 70–99)
HCT: 40 % (ref 36.0–46.0)
Hemoglobin: 13.6 g/dL (ref 12.0–15.0)
Potassium: 3.5 mmol/L (ref 3.5–5.1)
Sodium: 138 mmol/L (ref 135–145)
TCO2: 21 mmol/L — ABNORMAL LOW (ref 22–32)

## 2024-04-09 LAB — COMPREHENSIVE METABOLIC PANEL WITH GFR
ALT: 12 U/L (ref 0–44)
AST: 26 U/L (ref 15–41)
Albumin: 4.3 g/dL (ref 3.5–5.0)
Alkaline Phosphatase: 48 U/L (ref 38–126)
Anion gap: 17 — ABNORMAL HIGH (ref 5–15)
BUN: 15 mg/dL (ref 8–23)
CO2: 21 mmol/L — ABNORMAL LOW (ref 22–32)
Calcium: 9.7 mg/dL (ref 8.9–10.3)
Chloride: 99 mmol/L (ref 98–111)
Creatinine, Ser: 0.51 mg/dL (ref 0.44–1.00)
GFR, Estimated: >60 mL/min (ref >60)
Glucose, Bld: 209 mg/dL — ABNORMAL HIGH (ref 70–99)
Potassium: 3.5 mmol/L (ref 3.5–5.1)
Sodium: 137 mmol/L (ref 135–145)
Total Bilirubin: 0.8 mg/dL (ref 0.0–1.2)
Total Protein: 7.5 g/dL (ref 6.5–8.1)

## 2024-04-09 LAB — I-STAT CG4 LACTIC ACID, ED: Lactic Acid, Venous: 5.4 mmol/L (ref 0.5–1.9)

## 2024-04-09 LAB — URINALYSIS, ROUTINE W REFLEX MICROSCOPIC
Bilirubin Urine: NEGATIVE
Glucose, UA: NEGATIVE mg/dL
Hgb urine dipstick: NEGATIVE
Ketones, ur: 20 mg/dL — AB
Leukocytes,Ua: NEGATIVE
Nitrite: NEGATIVE
Protein, ur: NEGATIVE mg/dL
Specific Gravity, Urine: 1.013 (ref 1.005–1.030)
pH: 8 (ref 5.0–8.0)

## 2024-04-09 LAB — CBC
HCT: 38.3 % (ref 36.0–46.0)
Hemoglobin: 12.5 g/dL (ref 12.0–15.0)
MCH: 28.4 pg (ref 26.0–34.0)
MCHC: 32.6 g/dL (ref 30.0–36.0)
MCV: 87 fL (ref 80.0–100.0)
Platelets: 285 K/uL (ref 150–400)
RBC: 4.4 MIL/uL (ref 3.87–5.11)
RDW: 14.2 % (ref 11.5–15.5)
WBC: 17.3 K/uL — ABNORMAL HIGH (ref 4.0–10.5)
nRBC: 0 % (ref 0.0–0.2)

## 2024-04-09 LAB — PROTIME-INR
INR: 1 (ref 0.8–1.2)
Prothrombin Time: 13.9 s (ref 11.4–15.2)

## 2024-04-09 LAB — SAMPLE TO BLOOD BANK

## 2024-04-09 LAB — ETHANOL: Alcohol, Ethyl (B): <15 mg/dL (ref <15)

## 2024-04-09 LAB — MRSA NEXT GEN BY PCR, NASAL: MRSA by PCR Next Gen: NOT DETECTED

## 2024-04-09 MED ORDER — FENTANYL CITRATE (PF) 50 MCG/ML IJ SOSY: 25.0000 ug | PREFILLED_SYRINGE | Freq: Once | INTRAMUSCULAR | Status: DC

## 2024-04-09 MED ORDER — PANTOPRAZOLE SODIUM 40 MG PO TBEC: 40.0000 mg | DELAYED_RELEASE_TABLET | Freq: Every day | ORAL | Status: DC

## 2024-04-09 MED ORDER — POLYETHYLENE GLYCOL 3350 17 G PO PACK
17.0000 g | PACK | Freq: Every day | ORAL | Status: DC
  Administered 2024-04-10: 17 g
  Filled 2024-04-09: qty 1

## 2024-04-09 MED ORDER — CLEVIDIPINE BUTYRATE 0.5 MG/ML IV EMUL
0.0000 mg/h | INTRAVENOUS | Status: DC
  Administered 2024-04-09: 2 mg/h via INTRAVENOUS

## 2024-04-09 MED ORDER — HYDRALAZINE HCL 20 MG/ML IJ SOLN
10.0000 mg | INTRAMUSCULAR | Status: AC | PRN
  Administered 2024-04-10 – 2024-04-11 (×4): 10 mg via INTRAVENOUS
  Filled 2024-04-09 (×4): qty 1

## 2024-04-09 MED ORDER — FENTANYL BOLUS VIA INFUSION
25.0000 ug | INTRAVENOUS | Status: DC | PRN
  Administered 2024-04-09: 50 ug via INTRAVENOUS
  Administered 2024-04-09 – 2024-04-10 (×5): 100 ug via INTRAVENOUS

## 2024-04-09 MED ORDER — ETOMIDATE 2 MG/ML IV SOLN
INTRAVENOUS | Status: AC | PRN
  Administered 2024-04-09: 20 mg via INTRAVENOUS

## 2024-04-09 MED ORDER — FENTANYL 2500MCG IN NS 250ML (10MCG/ML) PREMIX INFUSION
0.0000 ug/h | INTRAVENOUS | Status: DC
  Administered 2024-04-09: 5 ug/h via INTRAVENOUS
  Filled 2024-04-09: qty 250

## 2024-04-09 MED ORDER — ACETAMINOPHEN 500 MG PO TABS
1000.0000 mg | ORAL_TABLET | Freq: Four times a day (QID) | ORAL | Status: DC
  Administered 2024-04-09 – 2024-04-10 (×4): 1000 mg
  Filled 2024-04-09 (×4): qty 2

## 2024-04-09 MED ORDER — PROPOFOL 1000 MG/100ML IV EMUL
0.0000 ug/kg/min | INTRAVENOUS | Status: DC
  Administered 2024-04-10: 8 ug/kg/min via INTRAVENOUS
  Filled 2024-04-09: qty 100

## 2024-04-09 MED ORDER — METOPROLOL TARTRATE 5 MG/5ML IV SOLN
5.0000 mg | Freq: Four times a day (QID) | INTRAVENOUS | Status: DC | PRN
  Administered 2024-04-11: 5 mg via INTRAVENOUS
  Filled 2024-04-09: qty 5

## 2024-04-09 MED ORDER — DOCUSATE SODIUM 50 MG/5ML PO LIQD
100.0000 mg | Freq: Two times a day (BID) | ORAL | Status: DC
  Administered 2024-04-09 – 2024-04-10 (×2): 100 mg
  Filled 2024-04-09 (×2): qty 10

## 2024-04-09 MED ORDER — LORAZEPAM 2 MG/ML IJ SOLN
2.0000 mg | Freq: Once | INTRAMUSCULAR | Status: AC
  Administered 2024-04-09: 2 mg via INTRAVENOUS

## 2024-04-09 MED ORDER — ROCURONIUM BROMIDE 10 MG/ML (PF) SYRINGE
PREFILLED_SYRINGE | INTRAVENOUS | Status: AC | PRN
  Administered 2024-04-09: 80 mg via INTRAVENOUS

## 2024-04-09 MED ORDER — ONDANSETRON 4 MG PO TBDP
4.0000 mg | ORAL_TABLET | Freq: Four times a day (QID) | ORAL | Status: DC | PRN
  Administered 2024-04-22: 4 mg via ORAL
  Filled 2024-04-09: qty 1

## 2024-04-09 MED ORDER — POLYETHYLENE GLYCOL 3350 17 G PO PACK: 17.0000 g | PACK | Freq: Every day | ORAL | Status: DC | PRN

## 2024-04-09 MED ORDER — METHOCARBAMOL 500 MG PO TABS
500.0000 mg | ORAL_TABLET | Freq: Three times a day (TID) | ORAL | Status: DC
  Administered 2024-04-09 – 2024-04-10 (×2): 500 mg
  Filled 2024-04-09 (×2): qty 1

## 2024-04-09 MED ORDER — HALOPERIDOL LACTATE 5 MG/ML IJ SOLN
5.0000 mg | Freq: Once | INTRAMUSCULAR | Status: AC
  Administered 2024-04-09: 5 mg via INTRAVENOUS

## 2024-04-09 MED ORDER — METHOCARBAMOL 1000 MG/10ML IJ SOLN
500.0000 mg | Freq: Three times a day (TID) | INTRAMUSCULAR | Status: DC
  Administered 2024-04-09: 500 mg via INTRAVENOUS
  Filled 2024-04-09: qty 10

## 2024-04-09 MED ORDER — LORAZEPAM 2 MG/ML IJ SOLN
INTRAMUSCULAR | Status: AC
  Filled 2024-04-09: qty 1

## 2024-04-09 MED ORDER — ONDANSETRON HCL 4 MG/2ML IJ SOLN
4.0000 mg | Freq: Four times a day (QID) | INTRAMUSCULAR | Status: DC | PRN
  Administered 2024-04-17 – 2024-04-20 (×2): 4 mg via INTRAVENOUS
  Filled 2024-04-09 (×2): qty 2

## 2024-04-09 MED ORDER — ORAL CARE MOUTH RINSE
15.0000 mL | OROMUCOSAL | Status: DC
  Administered 2024-04-09 – 2024-04-10 (×7): 15 mL via OROMUCOSAL

## 2024-04-09 MED ORDER — LEVETIRACETAM (KEPPRA) 500 MG/5 ML ADULT IV PUSH
500.0000 mg | Freq: Two times a day (BID) | INTRAVENOUS | Status: DC
  Administered 2024-04-09 – 2024-04-10 (×4): 500 mg via INTRAVENOUS
  Filled 2024-04-09 (×5): qty 5

## 2024-04-09 MED ORDER — ORAL CARE MOUTH RINSE: 15.0000 mL | OROMUCOSAL | Status: DC | PRN

## 2024-04-09 MED ORDER — PANTOPRAZOLE SODIUM 40 MG IV SOLR
40.0000 mg | Freq: Every day | INTRAVENOUS | Status: DC
  Administered 2024-04-09 – 2024-04-16 (×8): 40 mg via INTRAVENOUS
  Filled 2024-04-09 (×8): qty 10

## 2024-04-09 MED ORDER — IOHEXOL 350 MG/ML SOLN
75.0000 mL | Freq: Once | INTRAVENOUS | Status: AC | PRN
  Administered 2024-04-09: 75 mL via INTRAVENOUS

## 2024-04-09 MED ORDER — LABETALOL HCL 5 MG/ML IV SOLN
10.0000 mg | Freq: Once | INTRAVENOUS | Status: AC
  Administered 2024-04-09: 10 mg via INTRAVENOUS

## 2024-04-09 MED ORDER — FENTANYL 2500MCG IN NS 250ML (10MCG/ML) PREMIX INFUSION: 0.0000 ug/h | INTRAVENOUS | Status: DC

## 2024-04-09 MED ORDER — SODIUM CHLORIDE 0.9 % IV SOLN: INTRAVENOUS | Status: AC

## 2024-04-09 MED ORDER — PROPOFOL 1000 MG/100ML IV EMUL
0.0000 ug/kg/min | INTRAVENOUS | Status: DC
  Administered 2024-04-09: 20 ug/kg/min via INTRAVENOUS

## 2024-04-09 NOTE — ED Provider Notes (Signed)
 Supervised resident visit.  Patient here with fall that was witnessed hit the back of her head.  Hematoma to the back of her head.  Not really able to provide any history as she is confused combative.  Level 5 caveat.  Overall she came in as a level 1 trauma given the fall and altered mental status.  Her pupils appear to be equal did not appear that she was on a blood thinner but we tried to give her Haldol and Ativan to sedate her but this was unsuccessful and for safety of the patient and to get CT imaging we intubated her.  My resident did intubation please see their note.  This was supervised by myself.  Ultimately lab work was somewhat unremarkable except for lactic acid of 5.4 which is likely in the setting of trauma.  CT scan of the head did confirm a left hemispheric subdural hematoma.  Neurosurgery was consulted by the trauma surgery team.  Dr. Sebastian who is at the bedside upon arrival.  Chest x-rays after intubation showed adequate ET tube placement.  There did not appear to be any other traumatic injury on CT scans.  Patient admitted to trauma team.  .Critical Care  Performed by: Ruthe Cornet, DO Authorized by: Ruthe Cornet, DO   Critical care provider statement:    Critical care time (minutes):  35   Critical care was necessary to treat or prevent imminent or life-threatening deterioration of the following conditions:  CNS failure or compromise and trauma   Critical care was time spent personally by me on the following activities:  Blood draw for specimens, development of treatment plan with patient or surrogate, discussions with primary provider, evaluation of patient's response to treatment, examination of patient, obtaining history from patient or surrogate, ordering and performing treatments and interventions, ordering and review of laboratory studies, ordering and review of radiographic studies, pulse oximetry, re-evaluation of patient's condition and review of old charts   Care  discussed with: admitting provider       Ruthe Cornet, DO 04/09/24 1442

## 2024-04-09 NOTE — ED Notes (Addendum)
 Pt has hematoma to back of head and abrasions to L shin. Restraints removed

## 2024-04-09 NOTE — ED Provider Notes (Signed)
 [place] EMERGENCY DEPARTMENT AT [facility] Provider Note   CSN: [id number] Arrival date & time: 04/09/24  9083     Patient presents with: No chief complaint on file.   Jeanne Ellison is a 83 y.o. female presenting to the Emergency Department as a level 1 trauma after fall with altered mental status.  Per EMS, patient was found down at home after having fall.  She was last known normal around 8 AM.  Per EMS, she was GCS 10 and route however has become more awake and agitated on arrival.  EMS reports a known history of diabetes but denies other significant medical history or known blood thinners.  EMS reports patient is typically at baseline alert and oriented x 4 and independently functional.  HPI is otherwise limited due to patient's altered mental status.   HPI     Prior to Admission medications   Medication Sig Start Date End Date Taking? Authorizing Provider  ALPRAZolam (XANAX) 1 MG tablet Take 0.5-1 mg by mouth 2 (two) times daily as needed. 04/02/24   [provider]  bisoprolol-hydrochlorothiazide (ZIAC) 2.5-6.25 MG tablet Take 1 tablet by mouth daily. 03/19/24   [provider]  citalopram (CELEXA) 20 MG tablet Take 20 mg by mouth daily. 04/02/24   [provider]  metFORMIN (GLUCOPHAGE-XR) 750 MG 24 hr tablet Take 1,500 mg by mouth every morning. 03/07/24   [provider]  rosuvastatin (CRESTOR) 10 MG tablet Take 10 mg by mouth at bedtime. 03/07/24   [provider]    Allergies: Patient has no allergy information on record.    Review of Systems  Unable to perform ROS: Acuity of condition    Updated Vital Signs BP (!) 116/49   Pulse 61   Temp (!) 97.2 F (36.2 C)   Resp 16   Ht 5' 5.5 (1.664 m)   Wt 70.3 kg   SpO2 99%   BMI 25.40 kg/m   Physical Exam Constitutional Nursing notes reviewed Vital signs reviewed  Head Palpable right posterior scalp hematoma No obvious skull fractures or depressions   ENT PERRL No conjunctival hemorrhage No periorbital ecchymoses, [place] Eyes, or Battle Sign bilaterally Ears atraumatic No nasal septal deviation or hematoma Mouth and tongue atraumatic Trachea midline.   Neck No C spine stepoffs, deformities, or tenderness C collar in place  Chest Clavicles atraumatic Clavicles stable to anterior compression without crepitus Chest wall with symmetric expansion Chest wall stable to anterior and lateral compression without crepitus  Respiratory Effort normal CTAB No respiratory distress  CV Normal rate DP and radial pulses 2+ and equal bilaterally  Abdomen Soft Non-tender Palpable distended bladder No peritonitis No abrasions/contusions  GU Atraumatic No gross blood  MSK Atraumatic No obvious deformity ROM appropriate Pelvis stable to lateral compression  Back T spine non-tender L spine non-tender No step offs or deformities   Skin Abrasions to left shin  Neuro Awake and alert Moving all extremities GCS 12     (all labs ordered are listed, but only abnormal results are displayed) Labs Reviewed  CBC - Abnormal; Notable for the following components:      Result Value   WBC 17.3 (*)    All other components within normal limits  URINALYSIS, ROUTINE W REFLEX MICROSCOPIC - Abnormal; Notable for the following components:   Color, Urine STRAW (*)    Ketones, ur 20 (*)    All other components within normal limits  I-STAT CHEM 8, ED - Abnormal; Notable for  the following components:   Creatinine, Ser 0.40 (*)    Glucose, Bld 220 (*)    Calcium, Ion 1.13 (*)    TCO2 21 (*)    All other components within normal limits  I-STAT CG4 LACTIC ACID, ED - Abnormal; Notable for the following components:   Lactic Acid, Venous 5.4 (*)    All other components within normal limits  I-STAT ARTERIAL BLOOD GAS, ED - Abnormal; Notable for the following components:   pO2, Arterial 514 (*)    Potassium 2.7 (*)    All other components within normal  limits  ETHANOL  PROTIME-INR  COMPREHENSIVE METABOLIC PANEL WITH GFR  BLOOD GAS, ARTERIAL  SAMPLE TO BLOOD BANK    EKG: None  Radiology: DG Abd Portable 1 View Result Date: 04/09/2024 CLINICAL DATA:  Nasogastric tube placement. EXAM: PORTABLE ABDOMEN - 1 VIEW COMPARISON:  None Available. FINDINGS: Nasogastric tube terminates in the body of the stomach. Excreted contrast in the renal collecting systems bilaterally. Bowel gas pattern is unremarkable. IMPRESSION: Nasogastric tube terminates in the body of the stomach. Electronically Signed   By: Newell Eke M.D.   On: 04/09/2024 11:57   CT CERVICAL SPINE WO CONTRAST Result Date: 04/09/2024 EXAM: CT CERVICAL SPINE WITHOUT CONTRAST 04/09/2024 10:01:09 AM TECHNIQUE: CT of the cervical spine was performed without the administration of intravenous contrast. Multiplanar reformatted images are provided for review. Automated exposure control, iterative reconstruction, and/or weight based adjustment of the mA/kV was utilized to reduce the radiation dose to as low as reasonably achievable. COMPARISON: CT neck 03/20/2004. CLINICAL HISTORY: Polytrauma, blunt. FINDINGS: CERVICAL SPINE: BONES AND ALIGNMENT: There is overall straightening of the cervical spine. Likely degenerative trace stepwise anterolisthesis of C2 on C3 and C3 on C4. Additional 3 mm anterolisthesis of C6 on C7, 4 mm anterolisthesis of C7 on T1, and trace stepwise anterolisthesis of T1 on T2 and T2 on T3, favored to be on a degenerative basis. No acute traumatic vertebral column listhesis. No acute fracture. DEGENERATIVE CHANGES: Multilevel intervertebral disc height loss, most notable at the C5-C6 level. No evidence of high-grade spinal stenosis. Multilevel uncovertebral spurring and facet arthropathy with neural foraminal stenosis most notable at the left C3-C4, bilateral C4-C5, and left C5-C6 levels. SOFT TISSUES: No prevertebral soft tissue swelling. Endotracheal tube. IMPRESSION: 1. No  acute fracture or traumatic malalignment of the cervical spine. 2. These findings were communicated to Dr. Sebastian by phone at 10:26 AM. Electronically signed by: prentice bybordi 04/09/2024 10:33 AM EST RP Workstation: [id number]   CT CHEST ABDOMEN PELVIS W CONTRAST Result Date: 04/09/2024 EXAM: CT CHEST, ABDOMEN AND PELVIS WITH CONTRAST 04/09/2024 10:03:05 AM TECHNIQUE: CT of the chest, abdomen and pelvis was performed with the administration of 75 mL of iohexol (OMNIPAQUE) 350 MG/ML intravenous contrast. Multiplanar reformatted images are provided for review. Automated exposure control, iterative reconstruction, and/or weight based adjustment of the mA/kV was utilized to reduce the radiation dose to as low as reasonably achievable. COMPARISON: None available. CLINICAL HISTORY: Polytrauma, blunt. FINDINGS: CHEST: MEDIASTINUM AND LYMPH NODES: No evidence of acute vascular injury or mediastinal hematoma. The heart is mildly enlarged. Possible calcifications of the aortic valve. Atherosclerosis of the coronary arteries. No significant pericardial effusion. Endotracheal tube extends into the distal trachea. The central airways are clear. There is a small hiatal hernia. No mediastinal, hilar or axillary lymphadenopathy. LUNGS AND PLEURA: Dependent atelectasis in both lungs. No focal consolidation or pulmonary edema. No suspicious pulmonary nodularity. No evidence of pleural effusion or pneumothorax. ABDOMEN AND  PELVIS: LIVER: The liver is unremarkable. GALLBLADDER AND BILE DUCTS: Gallbladder is unremarkable. No biliary ductal dilatation. SPLEEN: No acute abnormality. PANCREAS: No acute abnormality. ADRENAL GLANDS: No acute abnormality. KIDNEYS, URETERS AND BLADDER: A 1 cm cyst posteriorly in the interpolar region of the right kidney for which no specific follow up imaging is recommended. No stones in the kidneys or ureters. No hydronephrosis. No perinephric or periureteral stranding. The urinary bladder is  moderately distended without focal bladder wall thickening. GI AND BOWEL: Stomach demonstrates no acute abnormality. Bowel assessment limited by the lack of enteric contrast. The colon is incompletely distended. There are diverticulogenic changes within the descending and sigmoid colon without definite wall thickening or surrounding inflammation. There is no bowel obstruction. REPRODUCTIVE ORGANS: Status post hysterectomy. No evidence of adnexal mass. PERITONEUM AND RETROPERITONEUM: No ascites. No free air. No evidence of hemoperitoneum or pneumoperitoneum. VASCULATURE: Aorta is normal in caliber. Atherosclerosis of the aorta and branch vessels without evidence of an aneurysm or large vessel occlusion. ABDOMINAL AND PELVIS LYMPH NODES: No lymphadenopathy. BONES AND SOFT TISSUES: There is multilevel thoracic and lumbar spondylosis without evidence of acute fracture or traumatic subluxation. No acute osseous abnormality in the abdomen and pelvis. No focal soft tissue abnormality. IMPRESSION: 1. No acute traumatic injury identified in the chest, abdomen, or pelvis. 2. Aortic, branch vessel, and coronary atherosclerosis without aneurysm or large-vessel occlusion. 3. Mild cardiomegaly. 4. Small hiatal hernia. 5. Colonic diverticulosis without evidence of acute diverticulitis. 6. Findings discussed by telephone with Dr. Dann Hummer at 10:13 am. Electronically signed by: Elsie Perone MD 04/09/2024 10:16 AM EST RP Workstation: [id number]   CT HEAD WO CONTRAST Result Date: 04/09/2024 EXAM: CT Head Without Contrast _study_datetime_ TECHNIQUE: Axial and coronal images of the head without contrast. COMPARISON: Previous non-contrast head CT 08/11/2015. CLINICAL HISTORY: 83 year old female status post trauma. FINDINGS: BONES: Intubated on the scout view. Paranasal sinuses, middle ears and mastoids are well aerated. Underlying calvarium appears stable and intact. No overlying skull fracture identified. SOFT TISSUES: Broad  based right posterior convexity scalp hematoma measures up to 9 mm in thickness. BRAIN PARENCHYMA: Largely normal for age gray white differentiation. Mild for age periventricular white matter hypodensity with some bilateral deep white matter capsule involvement. No other acute intracranial hemorrhage identified. Hyperdense and mildly lobulated left hemispheric subdural hematoma. On coronal images this measures up to 6 mm thickness at most levels, but there is a lobulated 10 mm component along the left lateral occipital lobe. Mild mass effect on the left lateral ventricle. Mild rightward midline shift of 3 mm. Basilar cisterns remain patent. Traumatic brain injury risk stratification ----- Skull fracture: no (low - mbig 1) Subdural hematoma (sdh): Left hemispheric, up to 10 mm (high - mbig 3) Subarachnoid hemorrhage (sah): no Epidural hematoma (edh): no (low - mbig 1) Cerebral contusion, intra-axial, intraparenchymal hemorrhage (iph): no Intraventricular hemorrhage (ivh): no (low - mbig 1) Midline shift > 1mm or edema/effacement of sulci/vents: yes (high - mbig 3) IMPRESSION: 1. Left hemispheric subdural hematoma, 6 mm at most levels - but lobulated 10 mm component in the occipital region. Mild 3 mm rightward midline shift. 2. No skull fracture identified.  Scalp soft tissue injury. 3. Discussed by telephone with Dr Dann Hummer at 1004 hours today. Electronically signed by: Helayne Hurst MD 04/09/2024 10:08 AM EST RP Workstation: [id number]   DG Pelvis Portable Result Date: 04/09/2024 CLINICAL DATA:  Fall. EXAM: PORTABLE PELVIS 1-2 VIEWS COMPARISON:  None Available. FINDINGS: No acute fracture or dislocation.  The bones are osteopenic. The soft tissues are unremarkable. Partially visualized radiopaque foreign object projects over the upper pelvis. IMPRESSION: 1. No acute fracture or dislocation. 2. Partially visualized radiopaque foreign object projects over the upper pelvis. Electronically Signed   By: Vanetta Chou M.D.   On: 04/09/2024 09:59   DG Chest Port 1 View Result Date: 04/09/2024 CLINICAL DATA:  Trauma. EXAM: PORTABLE CHEST 1 VIEW COMPARISON:  Chest radiograph dated 07/10/2005. FINDINGS: Endotracheal tube with tip approximately 2 cm above the carina tilting towards the right mainstem bronchus. Recommend retraction by 2-3 cm for optimal positioning. Shallow inspiration. No focal consolidation, pleural effusion, pneumothorax. Mild cardiomegaly. No acute osseous pathology. IMPRESSION: 1. Endotracheal tube with tip approximately 2 cm above the carina. Recommend retraction by 2-3 cm for optimal positioning. 2. No active cardiopulmonary disease. Electronically Signed   By: Vanetta Chou M.D.   On: 04/09/2024 09:57     Procedure Name: Intubation Date/Time: 04/09/2024 9:30 AM  Performed by: Sharlet Dowdy, MDPre-anesthesia Checklist: Patient identified, Emergency Drugs available, Suction available, Timeout performed and Patient being monitored Oxygen Delivery Method: Non-rebreather mask Preoxygenation: Pre-oxygenation with 100% oxygen Induction Type: Rapid sequence Ventilation: Mask ventilation without difficulty Laryngoscope Size: Glidescope and 3 Grade View: Grade IV Tube size: 7.0 mm Number of attempts: 1 Airway Equipment and Method: Rigid stylet and Video-laryngoscopy Placement Confirmation: ETT inserted through vocal cords under direct vision, CO2 detector and Breath sounds checked- equal and bilateral Secured at: 22 cm Tube secured with: ETT holder       Medications Ordered in the ED  docusate (COLACE) 50 MG/5ML liquid 100 mg (has no administration in time range)  polyethylene glycol (MIRALAX / GLYCOLAX) packet 17 g (17 g Per Tube Not Given 04/09/24 1002)  fentaNYL (SUBLIMAZE) bolus via infusion 25-100 mcg (has no administration in time range)  clevidipine (CLEVIPREX) infusion 0.5 mg/mL (6 mg/hr Intravenous Rate/Dose Change 04/09/24 1025)  acetaminophen (TYLENOL) tablet 1,000  mg (has no administration in time range)  methocarbamol (ROBAXIN) tablet 500 mg (has no administration in time range)    Or  methocarbamol (ROBAXIN) injection 500 mg (has no administration in time range)  polyethylene glycol (MIRALAX / GLYCOLAX) packet 17 g (has no administration in time range)  ondansetron (ZOFRAN-ODT) disintegrating tablet 4 mg (has no administration in time range)    Or  ondansetron (ZOFRAN) injection 4 mg (has no administration in time range)  metoprolol tartrate (LOPRESSOR) injection 5 mg (has no administration in time range)  hydrALAZINE (APRESOLINE) injection 10 mg (has no administration in time range)  0.9 %  sodium chloride infusion (has no administration in time range)  pantoprazole (PROTONIX) EC tablet 40 mg (has no administration in time range)    Or  pantoprazole (PROTONIX) injection 40 mg (has no administration in time range)  levETIRAcetam (KEPPRA) undiluted injection 500 mg (has no administration in time range)  propofol (DIPRIVAN) 1000 MG/100ML infusion (20 mcg/kg/min  70.3 kg Intravenous IV Pump Association 04/09/24 1153)  fentaNYL (SUBLIMAZE) injection 25-50 mcg ( Intravenous Not Given 04/09/24 1151)  fentaNYL in NS (30mcg/ml) infusion-PREMIX (50 mcg/hr Intravenous IV Pump Association 04/09/24 1152)  LORazepam (ATIVAN) injection 2 mg (2 mg Intravenous Given 04/09/24 0921)  haloperidol lactate (HALDOL) injection 5 mg (5 mg Intravenous Given 04/09/24 0925)  rocuronium (ZEMURON) injection (80 mg Intravenous Given 04/09/24 0931)  etomidate (AMIDATE) injection (20 mg Intravenous Given 04/09/24 0930)  labetalol (NORMODYNE) injection 10 mg (10 mg Intravenous Given by Other 04/09/24 1001)  iohexol (OMNIPAQUE) 350  MG/ML injection 75 mL (75 mLs Intravenous Contrast Given 04/09/24 1002)    Clinical Course as of 04/09/24 1205  Mon Apr 09, 2024  1014 CBC(!) Significant leukocytosis [LB]  1014 I-Stat Chem 8, ED(!) Hyperglycemia without significant  electrolyte abnormality [LB]  1014 I-Stat Lactic Acid, ED(!!) Significant lactic acidosis in the setting of trauma [LB]  1016 CT HEAD WO CONTRAST Left hemispheric subdural hematoma, 6 mm with 3 mm midline shift and lobulated component in the occipital lobe that is 10 mm [LB]  1043 CT CERVICAL SPINE WO CONTRAST No fracture or  malignment [LB]  1044 CT CHEST ABDOMEN PELVIS W CONTRAST No acute intra-abdominal injury [LB]    Clinical Course User Index [LB] Sharlet Dowdy, MD                                 Medical Decision Making The presents as a level 1 trauma after a fall with altered mentation as above.  EMS reports only known medical history is diabetes, no documented fill history of blood thinners.  Per EMS, patient was GCS 10 on arrival, mumbling incomprehensibly for most of transport however she recently began to scream intermittently and thrashing all extremities.  When the patient arrived, they were evaluated using standard ATLS protocol.  Airway, breathing, circulation were all confirmed and the patient's GCS was 12 at the time of arrival. A cervical collar was placed shortly after arrival. The patient appeared hemodynamically stable but was significantly agitated.  Patient was hypertensive on arrival. She states that patient found to have palpable hematoma to the posterior scalp, pupils were equal.  Patient was screaming and attempting to sit up, and moving all extremities.  Assessment and patient care was significantly obstructed by patient's mental status.  Patient is given IV Ativan and Haldol for acute agitation without improvement in her symptoms. Because of this, the decision was made to intubate patient for airway protection in the setting of altered mental status.  Etomidate and rocuronium were given for RSI agent and patient was initiated on propofol for continued sedation following.  Differential diagnosis includes but limited to skull fracture, C-spine injury, subdural hematoma,  intra-abdominal injury, long bone fracture  Head to toe primary assessment was performed and findings, detailed below, were notable for posterior scalp hematoma and abrasions to the left shin.  Laboratory studies were obtained and imaging, including trauma scans and plain films were ordered, as detailed above.  Trauma scans revealed a 6 mm subdural hematoma with 3 mm midline shift.  No other acute traumatic injuries identified.  Patient was started on  Cleviprex drip for blood pressure control.  Neurosurgery was contacted by the trauma team, final recommendations were pending at time of admission.  Patient was admitted to the trauma ICU, see admitting team's documentation for the remainder of patient's care.   Amount and/or Complexity of Data Reviewed Labs: ordered. Decision-making details documented in ED Course. Radiology: ordered and independent interpretation performed. Decision-making details documented in ED Course.  Risk OTC drugs. Prescription drug management. Decision regarding hospitalization.        Final diagnoses:  Fall, initial encounter  Subdural hematoma (HCC)  Acute encephalopathy    ED Discharge Orders     None          Sharlet Dowdy, MD 04/09/24 1206    Ruthe Cornet, DO 04/09/24 1243

## 2024-04-09 NOTE — ED Notes (Signed)
 Patient transported to CT

## 2024-04-09 NOTE — ED Notes (Addendum)
 Pt in bed, pt flailing and yelling, help me ron, ativan given, pt continues to yell and flail, haldol given, pt starting to relax, pt still yelling, but slightly quieter.

## 2024-04-09 NOTE — ED Notes (Signed)
 Tech at bedside, foley placed, pt had some urine output, but appears to be leaking around foley, md at bedside, foley replaced.

## 2024-04-09 NOTE — ED Notes (Addendum)
 Ativan 2mg  given, pt yelling and flailing, pt moving all extremities

## 2024-04-09 NOTE — Progress Notes (Signed)
 Pt transported to CT and back to TRA B via vent without complications.

## 2024-04-09 NOTE — Progress Notes (Signed)
   04/09/24 1105  Spiritual Encounters  Type of Visit Initial  Care provided to: Family  Reason for visit Trauma  OnCall Visit No  Spiritual Framework  Presenting Themes Impactful experiences and emotions  Community/Connection Family  Patient Stress Factors Major life changes  Family Stress Factors Exhausted;Health changes  Interventions  Spiritual Care Interventions Made Compassionate presence;Established relationship of care and support  Intervention Outcomes  Outcomes Awareness of health;Awareness of support;Reduced anxiety   Chaplain responded to a call in the Ed. Pt was intubated at the time of the visit. Chaplain escorted the daughter-in-law and husband to the Pt's room as the medical staff provided updates regarding the Pt's condition.  Chaplain remained present with the family, offering emotional and spiritual support, and encouraged them that chaplain services are here to walk with them through this journey, they are not alone. The family expressed gratitude for the chaplain and medical team support.

## 2024-04-09 NOTE — ED Notes (Incomplete)
 Trauma Response Nurse Documentation   Jeanne Ellison is a 83 y.o. female arriving to [facility] ED via [facility]  On No antithrombotic. Trauma was activated as a Level 1 by Burnard, RN - Charge RN based on the following trauma criteria GCS < 9.  Patient cleared for CT by Dr. Sebastian. Pt transported to CT with trauma response nurse present to monitor. RN remained with the patient throughout their absence from the department for clinical observation.   GCS 11  -- E - 2 - 3 /S - 3/ M -  5  Trauma MD Arrival Time: ***.  History   No past medical history on file.   See merged chart     Initial Focused Assessment (If applicable, or please see trauma documentation):  Airway - clear --  Breathing - unlabored CIrculation - strong peripheral pulses.  GCS - 10 -11 -- normally A/O x 4 at home per husband.    CT's Completed:   CT Head, CT C-Spine, CT Chest w/ contrast, and CT abdomen/pelvis w/ contrast   Interventions:  Labs Xrays CT scans NSG consult Admit to ICU  Consults completed:  Neurosurgeon at 1007 per Vertell Pringle, PA.  Event Summary:  To ED via [facility] from home-- husnabd found pt laying on floor minimally responsive.  On arrival to ED - pt is yelling, swionging, uncoo-erative, not able to be redirected. Pt was intubated on arrival, to go to CT scan, for airway protection.   See EDP/ Primary RN and TMD notes for further updates  Jeanne Ellison  Trauma Response RN  Please call TRN at [phone] for further assistance.

## 2024-04-09 NOTE — ED Notes (Signed)
 Pt back from ct

## 2024-04-09 NOTE — H&P (Signed)
 Jeanne Ellison 1941-04-26  [id number].    Requesting MD: Ruthe, MD Chief Complaint/Reason for Consult: found down, GCS 10  HPI:  Jeanne Ellison is an 83 y/o F who presented as a level 1 trauma. History is provided by EMS. Patient reportedly found down at home, last known normal 8:04 AM. Per EMS she was GCS 10 en route, making sounds but not forming words, swinging all extremities. Hypertensive. Per EMS she is A&Ox4 at baseline and does not take blood thinners. She was agitated in the ED and was intubated for airway protection and further workup.   Husband is en route to ED.   ROS: Review of Systems  Unable to perform ROS: Medical condition    No family history on file.  No past medical history on file.  Social History:  has no history on file for tobacco use, alcohol use, and drug use.  Allergies: Not on File  (Not in a hospital admission)    Physical Exam: Blood pressure (!) 212/136, pulse 100, temperature (!) 97.5 F (36.4 C), temperature source Temporal, resp. rate 20, SpO2 100%. General: elderly white female, agitated, moving all extremities  HEENT: head - right posterior scalp hematoma, no facial deformity or bleeding. Pupils are equal and round, no conjunctival injection, anicteric sclerae Throat: pink mucosa Neck- Trachea is midline CV- RRR, normal S1/S2, no M/R/G, radial and dorsalis pedis pulses 2+ BL, cap refill < 2 seconds. Pulm- breathing is non-labored; CTABL. No chest wall tenderness or crepitus. Abd- soft, non-tender, bladder distention with palpable bladder just below the umbilicus. GU- normal female anatomy, clear urine/urinary incontinence, rectal exam with decreased tone s/p paralytic medications, no gross blood, +stool MSK- UE/LE symmetrical, no cyanosis, clubbing, or edema. L>R abrasions of the shins bilaterally, small scattered abrasion right hi[ Neuro- GCS 12-13([id number]) Psych- unable to assess due to neurologic status Skin: warm and  dry, no rashes or lesions   Results for orders placed or performed during the hospital encounter of 04/09/24 (from the past 48 hours)  I-Stat Chem 8, ED     Status: Abnormal   Collection Time: 04/09/24  9:40 AM  Result Value Ref Range   Sodium 138 135 - 145 mmol/L   Potassium 3.5 3.5 - 5.1 mmol/L   Chloride 100 98 - 111 mmol/L   BUN 15 8 - 23 mg/dL   Creatinine, Ser 9.59 (L) 0.44 - 1.00 mg/dL   Glucose, Bld 779 (H) 70 - 99 mg/dL    Comment: Glucose reference range applies only to samples taken after fasting for at least 8 hours.   Calcium, Ion 1.13 (L) 1.15 - 1.40 mmol/L   TCO2 21 (L) 22 - 32 mmol/L   Hemoglobin 13.6 12.0 - 15.0 g/dL   HCT 59.9 63.9 - 53.9 %  I-Stat Lactic Acid, ED     Status: Abnormal   Collection Time: 04/09/24  9:40 AM  Result Value Ref Range   Lactic Acid, Venous 5.4 (HH) 0.5 - 1.9 mmol/L   Comment NOTIFIED PHYSICIAN    DG Pelvis Portable Result Date: 04/09/2024 CLINICAL DATA:  Fall. EXAM: PORTABLE PELVIS 1-2 VIEWS COMPARISON:  None Available. FINDINGS: No acute fracture or dislocation. The bones are osteopenic. The soft tissues are unremarkable. Partially visualized radiopaque foreign object projects over the upper pelvis. IMPRESSION: 1. No acute fracture or dislocation. 2. Partially visualized radiopaque foreign object projects over the upper pelvis. Electronically Signed   By: Vanetta Chou M.D.   On: 04/09/2024 09:59  DG Chest Port 1 View Result Date: 04/09/2024 CLINICAL DATA:  Trauma. EXAM: PORTABLE CHEST 1 VIEW COMPARISON:  Chest radiograph dated 07/10/2005. FINDINGS: Endotracheal tube with tip approximately 2 cm above the carina tilting towards the right mainstem bronchus. Recommend retraction by 2-3 cm for optimal positioning. Shallow inspiration. No focal consolidation, pleural effusion, pneumothorax. Mild cardiomegaly. No acute osseous pathology. IMPRESSION: 1. Endotracheal tube with tip approximately 2 cm above the carina. Recommend retraction by 2-3  cm for optimal positioning. 2. No active cardiopulmonary disease. Electronically Signed   By: Vanetta Chou M.D.   On: 04/09/2024 09:57      Assessment/Plan 83 y/o F found down  R posterior scalp hematoma L subdural hematoma with 3mm midline shift - neurosurgery consulted at 10:07 AM, Dr. Lanis, keppra  Acute respiratory failure - intubated due to neuro status  HTN - ICH parameters, BP < 150 mmhg systolic, currently on cleviprex HLD Diabetes mellitus    FEN - NPO, IVF VTE - SCD's, chemical VTE held due to intracranial hemorrhage ID - none Foley- placed 11/10 Admit - Trauma ICU  CT chest, abdomen, pelvis are negative for acute injury.    I reviewed nursing notes, ED provider notes, last 24 h vitals and pain scores, last 48 h intake and output, last 24 h labs and trends, and last 24 h imaging results.  Jeanne GORMAN Pringle, PA-C [facility] 04/09/2024, 10:02 AM Please see Amion for pager number during day hours 7:00am-4:30pm or 7:00am -11:30am on weekends

## 2024-04-09 NOTE — ED Notes (Signed)
 Pt intubated by md, pt has bilat breath sounds and color change

## 2024-04-09 NOTE — Progress Notes (Signed)
 Pt transported from 4N25 to CT and back on the ventilator without any complications

## 2024-04-09 NOTE — ED Notes (Signed)
 C spine clear, c collar removed

## 2024-04-09 NOTE — ED Notes (Signed)
 Pt in bed, x ray at bedside, pt moving extremities, increased prop

## 2024-04-09 NOTE — ED Notes (Signed)
 O2 2L via Turner placed

## 2024-04-09 NOTE — Consult Note (Signed)
  Chief Complaint   Trauma code s/p fall  History of Present Illness  Jeanne Ellison is a 83 y.o. female brought to the ED by EMS with altered mental status s/p fall at home. Upon arrival to ED was noted to be moving all extremities, speaking intelligibly but confused and combative ultimately requiring intubation.  Past Medical History  No past medical history on file.  Past Surgical History  Unkown  Social History  Unknown  Medications   Prior to Admission medications   Medication Sig Start Date End Date Taking? Authorizing Provider  ALPRAZolam (XANAX) 1 MG tablet Take 0.5-1 mg by mouth 2 (two) times daily as needed. 04/02/24   [provider]  bisoprolol-hydrochlorothiazide (ZIAC) 2.5-6.25 MG tablet Take 1 tablet by mouth daily. 03/19/24   [provider]  citalopram (CELEXA) 20 MG tablet Take 20 mg by mouth daily. 04/02/24   [provider]  metFORMIN (GLUCOPHAGE-XR) 750 MG 24 hr tablet Take 1,500 mg by mouth every morning. 03/07/24   [provider]  rosuvastatin (CRESTOR) 10 MG tablet Take 10 mg by mouth at bedtime. 03/07/24   [provider]    Allergies  Not on File  Review of Systems  ROS  Neurologic Exam  Intubated Breathing over vent Pupils 3mm, reactive Localizes to pain BUE  Imaging  [facility] and C-spine personally reviewed. CTH demonstrates left convexity holohemispheric SDH measruing ~79mm in thickness with a thicker component overlying the occipital lobe. No significant MLS. No HCP. C-spine negative for acute fracture.  Impression  - 83 y.o. female s/p fall, apparently neurologically intact with thin left convexity acute SDH with minimal associated mass effect. Would therefore manage conservatively.  Plan  - Cont supportive care per trauma - Routine SZ prophylaxis keppra 500mg  BID x 7d - Repeat CTH in 12 hrs  Gerldine Maizes, MD [facility]

## 2024-04-09 NOTE — TOC CAGE-AID Note (Signed)
 Transition of Care [facility]) - CAGE-AID Screening   Patient Details  Name: Jeanne Ellison MRN: [id number] Date of Birth: 08-13-1940  Transition of Care [facility]) CM/SW Contact:    Suzane Vanderweide E Nashay Brickley, LCSW Phone Number: 04/09/2024, 12:14 PM   Clinical Narrative: Patient is currently intubated.   CAGE-AID Screening: Substance Abuse Screening unable to be completed due to: : Patient unable to participate

## 2024-04-09 NOTE — ED Notes (Addendum)
 Pt continues to yell and flail.  Pre pairing for intubation. Pt placed in soft restraints for medical necessity.

## 2024-04-09 NOTE — Progress Notes (Signed)
 Pt transported from TRA B to 4N25 via vent without complications.

## 2024-04-10 ENCOUNTER — Inpatient Hospital Stay (HOSPITAL_COMMUNITY)

## 2024-04-10 LAB — BASIC METABOLIC PANEL WITH GFR
Anion gap: 13 (ref 5–15)
BUN: 16 mg/dL (ref 8–23)
CO2: 23 mmol/L (ref 22–32)
Calcium: 8.4 mg/dL — ABNORMAL LOW (ref 8.9–10.3)
Chloride: 100 mmol/L (ref 98–111)
Creatinine, Ser: 0.57 mg/dL (ref 0.44–1.00)
GFR, Estimated: >60 mL/min (ref >60)
Glucose, Bld: 111 mg/dL — ABNORMAL HIGH (ref 70–99)
Potassium: 3.4 mmol/L — ABNORMAL LOW (ref 3.5–5.1)
Sodium: 136 mmol/L (ref 135–145)

## 2024-04-10 LAB — CBC
HCT: 29.5 % — ABNORMAL LOW (ref 36.0–46.0)
Hemoglobin: 9.8 g/dL — ABNORMAL LOW (ref 12.0–15.0)
MCH: 28.7 pg (ref 26.0–34.0)
MCHC: 33.2 g/dL (ref 30.0–36.0)
MCV: 86.3 fL (ref 80.0–100.0)
Platelets: 192 K/uL (ref 150–400)
RBC: 3.42 MIL/uL — ABNORMAL LOW (ref 3.87–5.11)
RDW: 14.5 % (ref 11.5–15.5)
WBC: 8.7 K/uL (ref 4.0–10.5)
nRBC: 0 % (ref 0.0–0.2)

## 2024-04-10 LAB — TRIGLYCERIDES: Triglycerides: 108 mg/dL (ref <150)

## 2024-04-10 MED ORDER — ACETAMINOPHEN 500 MG PO TABS
1000.0000 mg | ORAL_TABLET | Freq: Four times a day (QID) | ORAL | Status: DC
  Administered 2024-04-10 – 2024-04-12 (×6): 1000 mg via ORAL
  Filled 2024-04-10 (×7): qty 2

## 2024-04-10 MED ORDER — METHOCARBAMOL 500 MG PO TABS
500.0000 mg | ORAL_TABLET | Freq: Three times a day (TID) | ORAL | Status: AC
  Administered 2024-04-10 – 2024-04-12 (×4): 500 mg via ORAL
  Filled 2024-04-10 (×4): qty 1

## 2024-04-10 MED ORDER — POTASSIUM CHLORIDE CRYS ER 20 MEQ PO TBCR
40.0000 meq | EXTENDED_RELEASE_TABLET | Freq: Once | ORAL | Status: AC
  Administered 2024-04-10: 40 meq via ORAL
  Filled 2024-04-10: qty 2

## 2024-04-10 MED ORDER — DOCUSATE SODIUM 50 MG/5ML PO LIQD: 100.0000 mg | Freq: Two times a day (BID) | ORAL | Status: DC

## 2024-04-10 MED ORDER — DOCUSATE SODIUM 100 MG PO CAPS
100.0000 mg | ORAL_CAPSULE | Freq: Two times a day (BID) | ORAL | Status: DC
  Administered 2024-04-10: 100 mg via ORAL
  Filled 2024-04-10 (×3): qty 1

## 2024-04-10 MED ORDER — METHOCARBAMOL 1000 MG/10ML IJ SOLN
500.0000 mg | Freq: Three times a day (TID) | INTRAMUSCULAR | Status: AC
  Administered 2024-04-10 – 2024-04-11 (×2): 500 mg via INTRAVENOUS
  Filled 2024-04-10 (×2): qty 10

## 2024-04-10 MED ORDER — TRAMADOL HCL 50 MG PO TABS
50.0000 mg | ORAL_TABLET | Freq: Four times a day (QID) | ORAL | Status: DC | PRN
  Administered 2024-04-10 – 2024-04-11 (×3): 50 mg via ORAL
  Filled 2024-04-10 (×3): qty 1

## 2024-04-10 MED ORDER — FENTANYL CITRATE (PF) 50 MCG/ML IJ SOSY
25.0000 ug | PREFILLED_SYRINGE | INTRAMUSCULAR | Status: DC | PRN
  Administered 2024-04-11: 50 ug via INTRAVENOUS
  Filled 2024-04-10: qty 1

## 2024-04-10 MED ORDER — POLYETHYLENE GLYCOL 3350 17 G PO PACK
17.0000 g | PACK | Freq: Every day | ORAL | Status: DC
  Filled 2024-04-10: qty 1

## 2024-04-10 MED ORDER — CHLORHEXIDINE GLUCONATE CLOTH 2 % EX PADS
6.0000 | MEDICATED_PAD | Freq: Every day | CUTANEOUS | Status: DC
  Administered 2024-04-10 – 2024-04-20 (×11): 6 via TOPICAL

## 2024-04-10 NOTE — Progress Notes (Addendum)
 Pt BP increased and complaining of headache. Tramadol  and Tylenol  given for headache. Hydralazine  given for BP. Dr. Lyndel notified.   BP- 151/61 Map 86 @ 0000.    04/10/24 2329  Vitals  Temp 98.4 F (36.9 C)  Temp Source Oral  BP (!) 216/77  MAP (mmHg) 114  BP Location Right Arm  BP Method Automatic  Patient Position (if appropriate) Lying  Pulse Rate 65  Pulse Rate Source Monitor  ECG Heart Rate 66  Resp (!) 23  Level of Consciousness  Level of Consciousness Alert  MEWS COLOR  MEWS Score Color Yellow  Oxygen Therapy  SpO2 90 %  O2 Device Room Air  MEWS Score  MEWS Temp 0  MEWS Systolic 2  MEWS Pulse 0  MEWS RR 1  MEWS LOC 0  MEWS Score 3

## 2024-04-10 NOTE — Progress Notes (Signed)
 Patient ID: Jeanne Ellison, female   DOB: [birth year], 83 y.o.   MRN: [id number] Follow up - Trauma Critical Care   Patient Details:    Jeanne Ellison is an 83 y.o. female.  Lines/tubes : Airway 7.5 mm (Active)  Secured at (cm) 23 cm 04/10/24 0428  Measured From Lips 04/10/24 0428  Secured Location Center 04/10/24 0428  Secured By [employer] 04/10/24 0428  Bite Block No 04/10/24 0428  Tube Holder Repositioned Yes 04/10/24 0428  Prone position No 04/10/24 0428  Cuff Pressure (cm H2O) Green OR 18-26 CmH2O 04/10/24 0428  Site Condition Dry 04/10/24 0428     NG/OG Vented/Dual Lumen 16 Fr. Oral 56 cm (Active)  Tube Position (Required) Marking at nare/corner of mouth 04/10/24 0709  Measurement (cm) (Required) 60 cm 04/10/24 0709  Ongoing Placement Verification (Required) (See row information) Yes 04/10/24 0709  Site Assessment Clean, Dry, Intact 04/10/24 0709  Interventions Cleansed 04/10/24 0709  Status Clamped 04/10/24 0709     Urethral Catheter Devin Latex 16 Fr. (Active)  Indication for Insertion or Continuance of Catheter Unstable critically ill patients first 24-48 hours (See Criteria) 04/10/24 0708  Site Assessment Clean, Dry, Intact 04/10/24 0708  Catheter Maintenance Bag below level of bladder;Catheter secured;Drainage bag/tubing not touching floor;Insertion date on drainage bag;No dependent loops;Seal intact 04/10/24 0708  Collection Container Standard drainage bag 04/10/24 0708  Securement Method Adhesive securement device 04/10/24 0708  Urinary Catheter Interventions (if applicable) Unclamped 04/09/24 2000  Output (mL) 75 mL 04/10/24 0416    Microbiology/Sepsis markers: Results for orders placed or performed during the hospital encounter of 04/09/24  MRSA Next Gen by PCR, Nasal     Status: None   Collection Time: 04/09/24 12:21 PM   Specimen: Nasal Mucosa; Nasal Swab  Result Value Ref Range Status   MRSA by PCR Next Gen NOT DETECTED NOT DETECTED  Final    Comment: (NOTE) The GeneXpert MRSA Assay (FDA approved for NASAL specimens only), is one component of a comprehensive MRSA colonization surveillance program. It is not intended to diagnose MRSA infection nor to guide or monitor treatment for MRSA infections. Test performance is not FDA approved in patients less than 33 years old. Performed at [facility], [address], [place], [place] [address]     Anti-infectives:  Anti-infectives (From admission, onward)    None     Consults: Treatment Team:  Md, Trauma, MD Lanis Pupa, MD    Studies:    Events:  Subjective:    Overnight Issues:  F/U CT head stable Objective:  Vital signs for last 24 hours: Temp:  [96.6 F (35.9 C)-100.2 F (37.9 C)] 99.1 F (37.3 C) (11/11 0730) Pulse Rate:  [54-100] 56 (11/11 0730) Resp:  [14-20] 16 (11/11 0730) BP: (94-250)/(42-136) 132/56 (11/11 0811) SpO2:  [98 %-100 %] 99 % (11/11 0730) FiO2 (%):  [40 %-100 %] 40 % (11/11 0811) Weight:  [70.3 kg] 70.3 kg (11/10 1018)  Hemodynamic parameters for last 24 hours:    Intake/Output from previous day: 11/10 0701 - 11/11 0700 In: 1934.5 [I.V.:1924.5; IV Piggyback:10] Out: 2470 [Urine:2470]  Intake/Output this shift: No intake/output data recorded.  Vent settings for last 24 hours: Vent Mode: PRVC FiO2 (%):  [40 %-100 %] 40 % Set Rate:  [16 bmp] 16 bmp Vt Set:  [460 mL] 460 mL PEEP:  [5 cmH20] 5 cmH20 Plateau Pressure:  [12 cmH20-29 cmH20] 14 cmH20  Physical Exam:  General: alert and on vent Neuro: F/C  well UE and LE HEENT/Neck: ETT Resp: clear to auscultation bilaterally CVS: RRR GI: soft, NT, ND Extremities: calves sift, abrasion and contusion L shin  Results for orders placed or performed during the hospital encounter of 04/09/24 (from the past 24 hours)  Sample to Blood Bank     Status: None   Collection Time: 04/09/24  9:28 AM  Result Value Ref Range   Blood Bank Specimen SAMPLE AVAILABLE FOR  TESTING    Sample Expiration      04/12/2024,2359 Performed at [facility], [address], [place], [place] [address]   I-Stat Chem 8, ED     Status: Abnormal   Collection Time: 04/09/24  9:40 AM  Result Value Ref Range   Sodium 138 135 - 145 mmol/L   Potassium 3.5 3.5 - 5.1 mmol/L   Chloride 100 98 - 111 mmol/L   BUN 15 8 - 23 mg/dL   Creatinine, Ser 9.59 (L) 0.44 - 1.00 mg/dL   Glucose, Bld 779 (H) 70 - 99 mg/dL   Calcium, Ion 8.86 (L) 1.15 - 1.40 mmol/L   TCO2 21 (L) 22 - 32 mmol/L   Hemoglobin 13.6 12.0 - 15.0 g/dL   HCT 59.9 63.9 - 53.9 %  I-Stat Lactic Acid, ED     Status: Abnormal   Collection Time: 04/09/24  9:40 AM  Result Value Ref Range   Lactic Acid, Venous 5.4 (HH) 0.5 - 1.9 mmol/L   Comment NOTIFIED PHYSICIAN   Comprehensive metabolic panel     Status: Abnormal   Collection Time: 04/09/24  9:54 AM  Result Value Ref Range   Sodium 137 135 - 145 mmol/L   Potassium 3.5 3.5 - 5.1 mmol/L   Chloride 99 98 - 111 mmol/L   CO2 21 (L) 22 - 32 mmol/L   Glucose, Bld 209 (H) 70 - 99 mg/dL   BUN 15 8 - 23 mg/dL   Creatinine, Ser 9.48 0.44 - 1.00 mg/dL   Calcium 9.7 8.9 - 89.6 mg/dL   Total Protein 7.5 6.5 - 8.1 g/dL   Albumin 4.3 3.5 - 5.0 g/dL   AST 26 15 - 41 U/L   ALT 12 0 - 44 U/L   Alkaline Phosphatase 48 38 - 126 U/L   Total Bilirubin 0.8 0.0 - 1.2 mg/dL   GFR, Estimated >39 >39 mL/min   Anion gap 17 (H) 5 - 15  CBC     Status: Abnormal   Collection Time: 04/09/24  9:54 AM  Result Value Ref Range   WBC 17.3 (H) 4.0 - 10.5 K/uL   RBC 4.40 3.87 - 5.11 MIL/uL   Hemoglobin 12.5 12.0 - 15.0 g/dL   HCT 61.6 63.9 - 53.9 %   MCV 87.0 80.0 - 100.0 fL   MCH 28.4 26.0 - 34.0 pg   MCHC 32.6 30.0 - 36.0 g/dL   RDW 85.7 88.4 - 84.4 %   Platelets 285 150 - 400 K/uL   nRBC 0.0 0.0 - 0.2 %  Protime-INR     Status: None   Collection Time: 04/09/24  9:54 AM  Result Value Ref Range   Prothrombin Time 13.9 11.4 - 15.2 seconds   INR 1.0 0.8 - 1.2  Ethanol      Status: None   Collection Time: 04/09/24  9:56 AM  Result Value Ref Range   Alcohol, Ethyl (B) <15 <15 mg/dL  I-Stat arterial blood gas, ED     Status: Abnormal   Collection Time: 04/09/24 10:40 AM  Result Value Ref Range   pH, Arterial 7.425 7.35 - 7.45   pCO2 arterial 34.7 32 - 48 mmHg   pO2, Arterial 514 (H) 83 - 108 mmHg   Bicarbonate 23.1 20.0 - 28.0 mmol/L   TCO2 24 22 - 32 mmol/L   O2 Saturation 100 %   Acid-base deficit 1.0 0.0 - 2.0 mmol/L   Sodium 137 135 - 145 mmol/L   Potassium 2.7 (LL) 3.5 - 5.1 mmol/L   Calcium, Ion 1.15 1.15 - 1.40 mmol/L   HCT 37.0 36.0 - 46.0 %   Hemoglobin 12.6 12.0 - 15.0 g/dL   Patient temperature 03.6 F    Collection site RADIAL, ALLEN'S TEST ACCEPTABLE    Drawn by RT    Sample type ARTERIAL    Comment NOTIFIED PHYSICIAN   Urinalysis, Routine w reflex microscopic -Urine, Catheterized     Status: Abnormal   Collection Time: 04/09/24 10:57 AM  Result Value Ref Range   Color, Urine STRAW (A) YELLOW   APPearance CLEAR CLEAR   Specific Gravity, Urine 1.013 1.005 - 1.030   pH 8.0 5.0 - 8.0   Glucose, UA NEGATIVE NEGATIVE mg/dL   Hgb urine dipstick NEGATIVE NEGATIVE   Bilirubin Urine NEGATIVE NEGATIVE   Ketones, ur 20 (A) NEGATIVE mg/dL   Protein, ur NEGATIVE NEGATIVE mg/dL   Nitrite NEGATIVE NEGATIVE   Leukocytes,Ua NEGATIVE NEGATIVE  MRSA Next Gen by PCR, Nasal     Status: None   Collection Time: 04/09/24 12:21 PM   Specimen: Nasal Mucosa; Nasal Swab  Result Value Ref Range   MRSA by PCR Next Gen NOT DETECTED NOT DETECTED    Assessment & Plan: Present on Admission:  SDH (subdural hematoma) (HCC)    LOS: 1 day   Additional comments:I reviewed the patient's new clinical lab test results. / 83 y/o F GLF  R posterior scalp hematoma L subdural hematoma with 3mm midline shift - neurosurgery consulted at 10:07 AM, Dr. Lanis, keppra, F/U CT H stable, F/C well this AM Acute hypoxic ventilator dependent respiratory failure -  intubated due to neuro status, weaning well this AM, likely extubate later today HTN - ICH parameters, BP < 150 mmhg systolic, was on cleviprex but this is off now HLD Diabetes mellitus      FEN - NPO with plan to extubate, IVF VTE - SCD's, chemical VTE held due to intracranial hemorrhage ID - none Foley- placed 11/10 Dispo - ICU, wean to extubate, plan TBI team therapies  Critical Care Total Time*: 35 Minutes  Dann Hummer, MD, MPH, FACS Trauma & General Surgery Use [email or web address] to contact on call provider  04/10/2024  *Care during the described time interval was provided by me. I have reviewed this patient's available data, including medical history, events of note, physical examination and test results as part of my evaluation.

## 2024-04-10 NOTE — Evaluation (Signed)
 Physical Therapy Evaluation Patient Details Name: Jeanne Ellison MRN: [id number] DOB: [birth year] Today's Date: 04/10/2024  History of Present Illness  83 yo female presents [facility]/10 s/p fall with AMS. Imaging shows  6 mm L SDH with 3 mm midline shift. CT chest, abdomen, pelvis; and CT cervical spine are negative for acute injury. PMH DM.  Clinical Impression  Pt presents with generalized weakness, impaired sitting balance with R lateral bias, lethargy suspect related to head injury, impaired activty tolerance. Pt to benefit from acute PT to address deficits. Pt requiring mod assist for to/from EOB, once EOB pt sat EOB x5 minutes before fatiguing. PT anticipates some progress with improved arousal level, PT educated pt and family on injury and importance of short bouts of activity but with frequent rest to encourage recovery. PT to progress mobility as tolerated, and will continue to follow acutely.          If plan is discharge home, recommend the following: A lot of help with walking and/or transfers;A lot of help with bathing/dressing/bathroom   Can travel by private vehicle        Equipment Recommendations None recommended by PT  Recommendations for Other Services       Functional Status Assessment Patient has had a recent decline in their functional status and demonstrates the ability to make significant improvements in function in a reasonable and predictable amount of time.     Precautions / Restrictions Precautions Precautions: Fall Precaution/Restrictions Comments: SBP <150 Restrictions Weight Bearing Restrictions Per Provider Order: No      Mobility  Bed Mobility Overal bed mobility: Needs Assistance Bed Mobility: Supine to Sit, Sit to Supine     Supine to sit: Mod assist Sit to supine: Mod assist   General bed mobility comments: assist for trunk and LE management, boost up in bed.    Transfers                   General transfer comment: NT -  pt too lethargic    Ambulation/Gait                  Stairs            Wheelchair Mobility     Tilt Bed    Modified Rankin (Stroke Patients Only)       Balance Overall balance assessment: Needs assistance Sitting-balance support: No upper extremity supported, Feet supported Sitting balance-Leahy Scale: Poor Sitting balance - Comments: fair to poor, heavy R lateral bias with fatigue                                     Pertinent Vitals/Pain Pain Assessment Pain Assessment: Faces Faces Pain Scale: Hurts a little bit Pain Location: head, nausea Pain Descriptors / Indicators: Headache Pain Intervention(s): Limited activity within patient's tolerance, Monitored during session, Repositioned    Home Living Family/patient expects to be discharged to:: Private residence Living Arrangements: Spouse/significant other Available Help at Discharge: Family Type of Home: House Home Access: Stairs to enter Entrance Stairs-Rails: Lawyer of Steps: 5   Home Layout: One level Home Equipment: Agricultural Consultant (2 wheels);Cane - single point      Prior Function Prior Level of Function : Independent/Modified Independent                     Extremity/Trunk Assessment   Upper Extremity Assessment Upper  Extremity Assessment: Defer to OT evaluation    Lower Extremity Assessment Lower Extremity Assessment: Generalized weakness;Difficult to assess due to impaired cognition    Cervical / Trunk Assessment Cervical / Trunk Assessment: Kyphotic  Communication   Communication Communication: No apparent difficulties Factors Affecting Communication: Hearing impaired    Cognition Arousal: Lethargic Behavior During Therapy: Flat affect   PT - Cognitive impairments: Difficult to assess, Sequencing, Problem solving, Safety/Judgement Difficult to assess due to: Level of arousal                     PT - Cognition  Comments: A&Ox4 with increased time Following commands: Impaired Following commands impaired: Follows one step commands with increased time     Cueing Cueing Techniques: Verbal cues, Gestural cues     General Comments General comments (skin integrity, edema, etc.): vss    Exercises     Assessment/Plan    PT Assessment Patient needs continued PT services  PT Problem List Decreased strength;Decreased mobility;Decreased safety awareness;Decreased range of motion;Decreased activity tolerance;Decreased knowledge of use of DME;Pain;Decreased balance;Decreased knowledge of precautions       PT Treatment Interventions DME instruction;Therapeutic activities;Gait training;Therapeutic exercise;Patient/family education;Balance training;Stair training;Functional mobility training;Neuromuscular re-education    PT Goals (Current goals can be found in the Care Plan section)  Acute Rehab PT Goals PT Goal Formulation: With patient Time For Goal Achievement: 04/24/24 Potential to Achieve Goals: Good    Frequency Min 3X/week     Co-evaluation               AM-PAC PT 6 Clicks Mobility  Outcome Measure Help needed turning from your back to your side while in a flat bed without using bedrails?: A Lot Help needed moving from lying on your back to sitting on the side of a flat bed without using bedrails?: A Lot Help needed moving to and from a bed to a chair (including a wheelchair)?: Total Help needed standing up from a chair using your arms (e.g., wheelchair or bedside chair)?: Total Help needed to walk in hospital room?: Total Help needed climbing 3-5 steps with a railing? : Total 6 Click Score: 8    End of Session   Activity Tolerance: Patient tolerated treatment well Patient left: with call bell/phone within reach;with family/visitor present;in bed;with bed alarm set Nurse Communication: Mobility status PT Visit Diagnosis: Other abnormalities of gait and mobility (R26.89);Muscle  weakness (generalized) (M62.81)    Time: 8359-8344 PT Time Calculation (min) (ACUTE ONLY): 15 min   Charges:   PT Evaluation $PT Eval Low Complexity: 1 Low   PT General Charges $$ ACUTE PT VISIT: 1 Visit         [name], PT DPT Acute Rehabilitation Services Secure Chat Preferred  Office [phone]   [place]ally Captain E Johna 04/10/2024, 5:09 PM

## 2024-04-10 NOTE — Progress Notes (Signed)
   Inpatient Rehab Admissions Coordinator :  Per therapy recommendations patient was screened for CIR candidacy by Ottie Glazier RN MSN. Patient is not yet at a level to tolerate the intensity required to pursue a CIR admit . The CIR admissions team will follow and monitor for progress and place a Rehab Consult order if felt to be appropriate. Please contact me with any questions.  Ottie Glazier RN MSN Admissions Coordinator [phone]

## 2024-04-10 NOTE — TOC Initial Note (Signed)
 Transition of Care [facility]) - Initial/Assessment Note    Patient Details  Name: Jeanne Ellison MRN: [id number] Date of Birth: [birth year]  Transition of Care [facility]) CM/SW Contact:    Keldan Eplin E Didier Brandenburg, LCSW Phone Number: 04/10/2024, 9:11 AM  Clinical Narrative:                 Patient admitted from home with spouse post fall. Patient was intubated in the ED and currently remains intubated. ICM will follow.    Barriers to Discharge: Continued Medical Work up   Patient Goals and CMS Choice            Expected Discharge Plan and Services       Living arrangements for the past 2 months: Single Family Home                                      Prior Living Arrangements/Services Living arrangements for the past 2 months: Single Family Home Lives with:: Spouse                   Activities of Daily Living   ADL Screening (condition at time of admission) Independently performs ADLs?: No Is the patient deaf or have difficulty hearing?: Yes Does the patient have difficulty seeing, even when wearing glasses/contacts?: Yes Does the patient have difficulty concentrating, remembering, or making decisions?: No  Permission Sought/Granted                  Emotional Assessment       Orientation: : Fluctuating Orientation (Suspected and/or reported Sundowners) Alcohol / Substance Use: Not Applicable Psych Involvement: No (comment)  Admission diagnosis:  SDH (subdural hematoma) (HCC) [S06.5XAA] Patient Active Problem List   Diagnosis Date Noted   SDH (subdural hematoma) (HCC) 04/09/2024   PCP:  No primary care provider on file. Pharmacy:   [facility] [place] [place], [place] - [address] [place] [address] Phone: [phone] Fax: [phone]     Social Drivers of Health (SDOH) Social History:   SDOH Interventions:     Readmission Risk Interventions     No data to display

## 2024-04-10 NOTE — Procedures (Signed)
 Extubation Procedure Note  Patient Details:   Name: Jeanne Ellison DOB: August 14, 1940 MRN: [id number]   Airway Documentation:    Vent end date: (not recorded) Vent end time: (not recorded)   Evaluation  O2 sats: stable throughout Complications: No apparent complications Patient did tolerate procedure well. Bilateral Breath Sounds: Clear   Yes  Ankita Newcomer 04/10/2024, 9:35 AM Pt extubated to 3L [place] at this time after positive cuff leak. Pt spoke her name after extubation.

## 2024-04-10 NOTE — Evaluation (Addendum)
 Speech Language Pathology Evaluation Patient Details Name: Jeanne Ellison MRN: [id number] DOB: May 03, 1941 Today's Date: 04/10/2024 Time: 8472-8454 SLP Time Calculation (min) (ACUTE ONLY): 18 min  Problem List:  Patient Active Problem List   Diagnosis Date Noted   SDH (subdural hematoma) (HCC) 04/09/2024   Past Medical History: History reviewed. No pertinent past medical history. Past Surgical History: History reviewed. No pertinent surgical history. HPI:  Patient is an 83 y.o. female who presented as a level 1 trauma on 11/10. History was provided by EMS. Patient reportedly found down at home, last known normal was 8:04am. Per husband, she was found down at the foot of the bed, he was unsure if this was related to the vertigo that she had previous experiences with in 2016&2019. He said that she had talked about her balance not being as good. Per EMS, she was reportedly making sounds but not forming words, swinging all extremities in . She was agitated in the ED and was intubated for airway protection (11/10) and further workup. CT head on 11/10 revealed left-sided subdural hematoma. Patient was extubated on 11/11. Per husband, patient struggles keeping her bloodsugar levels in her perscribed range. Cognitive langauge evaluation ordered to assess patient's cognition.   Assessment / Plan / Recommendation Clinical Impression  Patient was alert and awake. Patients husband and son were both at bedside. Son shared that before this fall, patient took care of herself and her husband and that there was nothing out of the ordinary. The husband shared that patient had previously mentioned her balance not being as good. He also shared patient had vertigo in 2016&2019 and was unsure if this fall was related to that. Husband shared that patient is doing much better today compared to yesterday. Patient's voice is weak and hoarse. Patient demonstrated awareness to this by sharing she knows her voice is weak  right now, and that it is normally not like this. Patient was oriented X 4 and explained all she knew about her fall to the SLP. Patient correctly followed 4 one step commands independently. When given scenarios from the [place] Information Processing Assessment (RIPA) problem solving and abstract reasoning subtest, patient experienced difficulty in answering the questions. For example, when asked what she would do in the event she ran out of gas on the highway, the patient replied that she would go to the service center. SLP will continue to follow and focus on problem solving and reasoning in therapy.    SLP Assessment  SLP Recommendation/Assessment: Patient needs continued Speech Language Pathology Services SLP Visit Diagnosis: Cognitive communication deficit (R41.841)     Assistance Recommended at Discharge  Other (comment) (TBD)  Functional Status Assessment Patient has had a recent decline in their functional status and demonstrates the ability to make significant improvements in function in a reasonable and predictable amount of time.  Frequency and Duration min 1 x/week  1 week      SLP Evaluation Cognition  Overall Cognitive Status: Within Functional Limits for tasks assessed Arousal/Alertness: Awake/alert Orientation Level: Oriented X4 Year: 2025 Month: November Day of Week: Correct Attention: Focused Focused Attention: Appears intact Memory: Appears intact Awareness: Appears intact Problem Solving: Impaired Problem Solving Impairment: Verbal basic;Functional basic Executive Function: Reasoning Reasoning: Impaired Reasoning Impairment: Verbal basic;Functional basic       Comprehension  Auditory Comprehension Overall Auditory Comprehension: Appears within functional limits for tasks assessed Yes/No Questions: Within Functional Limits Commands: Within Functional Limits Conversation: Simple EffectiveTechniques: Increased volume    Expression Expression Primary Mode  of  Expression: Verbal Verbal Expression Overall Verbal Expression: Appears within functional limits for tasks assessed Pragmatics: No impairment   Oral / Motor  Oral Motor/Sensory Function Overall Oral Motor/Sensory Function: Within functional limits Motor Speech Overall Motor Speech: Appears within functional limits for tasks assessed Respiration: Within functional limits Phonation: Hoarse;Low vocal intensity Resonance: Within functional limits Articulation: Within functional limitis Intelligibility: Intelligible Motor Planning: Within functional limits Motor Speech Errors: Not applicable Effective Techniques: Increased vocal intensity           Damien Hy  Graduate SLP Clinican

## 2024-04-10 NOTE — Progress Notes (Signed)
 Patient ID: Jeanne Ellison, female   DOB: [birth year], 83 y.o.   MRN: [id number] Did well with extubation. I updated her husband and grandson at the bedside.  Dann Hummer, MD, MPH, FACS Please use [email or web address] to contact on call provider

## 2024-04-10 NOTE — Progress Notes (Signed)
  NEUROSURGERY PROGRESS NOTE   Pt seen and examined. No issues overnight. Extubated this am.  EXAM: Temp:  [97.4 F (36.3 C)-100.2 F (37.9 C)] 97.9 F (36.6 C) (11/11 1200) Pulse Rate:  [54-79] 72 (11/11 1200) Resp:  [10-20] 10 (11/11 1200) BP: (94-183)/(42-71) 124/58 (11/11 1200) SpO2:  [95 %-100 %] 95 % (11/11 1200) FiO2 (%):  [40 %] 40 % (11/11 0811) Intake/Output      11/10 0701 11/11 0700 11/11 0701 11/12 0700   I.V. (mL/kg) 1924.5 (27.4) 340.7 (4.8)   IV Piggyback 10    Total Intake(mL/kg) 1934.5 (27.5) 340.7 (4.8)   Urine (mL/kg/hr) 2470    Total Output 2470    Net -535.5 +340.7         Awake, alert, oriented Speech fluent CN intact MAE good strength  LABS: Lab Results  Component Value Date   CREATININE 0.57 04/10/2024   BUN 16 04/10/2024   NA 136 04/10/2024   K 3.4 (L) 04/10/2024   CL 100 04/10/2024   CO2 23 04/10/2024   Lab Results  Component Value Date   WBC 8.7 04/10/2024   HGB 9.8 (L) 04/10/2024   HCT 29.5 (L) 04/10/2024   MCV 86.3 04/10/2024   PLT 192 04/10/2024    IMAGING: Repeat CTH yesterday evening reviewed and demonstrates stable appearance of left convexity SDH, No HCP.  IMPRESSION: - 83 y.o. female s/p fall, neurologically intact with stable small left convexity SDH.  PLAN: - Cont supportive care per trauma - Can f/u in outpatient clinic in 2-3 weeks with repeat CT - Finish 7d keppra 500mg  BID    Gerldine Maizes, MD [facility]

## 2024-04-11 ENCOUNTER — Inpatient Hospital Stay (HOSPITAL_COMMUNITY)

## 2024-04-11 ENCOUNTER — Encounter (HOSPITAL_COMMUNITY): Admission: EM | Disposition: A | Discharge status: Rehabilitation Facility | Payer: Self-pay | Source: Home / Self Care

## 2024-04-11 ENCOUNTER — Inpatient Hospital Stay (HOSPITAL_COMMUNITY): Admitting: Anesthesiology

## 2024-04-11 ENCOUNTER — Encounter (HOSPITAL_COMMUNITY): Payer: Self-pay | Admitting: General Surgery

## 2024-04-11 DIAGNOSIS — E119 Type 2 diabetes mellitus without complications: Secondary | ICD-10-CM

## 2024-04-11 DIAGNOSIS — E440 Moderate protein-calorie malnutrition: Secondary | ICD-10-CM | POA: Insufficient documentation

## 2024-04-11 DIAGNOSIS — I700 Atherosclerosis of aorta: Secondary | ICD-10-CM | POA: Diagnosis not present

## 2024-04-11 DIAGNOSIS — M19041 Primary osteoarthritis, right hand: Secondary | ICD-10-CM | POA: Diagnosis not present

## 2024-04-11 DIAGNOSIS — Z7984 Long term (current) use of oral hypoglycemic drugs: Secondary | ICD-10-CM

## 2024-04-11 DIAGNOSIS — S0990XA Unspecified injury of head, initial encounter: Secondary | ICD-10-CM | POA: Diagnosis not present

## 2024-04-11 DIAGNOSIS — S0003XA Contusion of scalp, initial encounter: Secondary | ICD-10-CM | POA: Diagnosis not present

## 2024-04-11 DIAGNOSIS — J9690 Respiratory failure, unspecified, unspecified whether with hypoxia or hypercapnia: Secondary | ICD-10-CM | POA: Diagnosis not present

## 2024-04-11 DIAGNOSIS — I10 Essential (primary) hypertension: Secondary | ICD-10-CM

## 2024-04-11 DIAGNOSIS — I6200 Nontraumatic subdural hemorrhage, unspecified: Secondary | ICD-10-CM | POA: Diagnosis not present

## 2024-04-11 DIAGNOSIS — Z434 Encounter for attention to other artificial openings of digestive tract: Secondary | ICD-10-CM | POA: Diagnosis not present

## 2024-04-11 DIAGNOSIS — M7989 Other specified soft tissue disorders: Secondary | ICD-10-CM | POA: Diagnosis not present

## 2024-04-11 DIAGNOSIS — I619 Nontraumatic intracerebral hemorrhage, unspecified: Secondary | ICD-10-CM | POA: Diagnosis not present

## 2024-04-11 HISTORY — PX: CRANIOTOMY: SHX93

## 2024-04-11 LAB — URINALYSIS, ROUTINE W REFLEX MICROSCOPIC
Bilirubin Urine: NEGATIVE
Glucose, UA: NEGATIVE mg/dL
Hgb urine dipstick: NEGATIVE
Ketones, ur: 80 mg/dL — AB
Leukocytes,Ua: NEGATIVE
Nitrite: NEGATIVE
Protein, ur: NEGATIVE mg/dL
Specific Gravity, Urine: 1.023 (ref 1.005–1.030)
pH: 5 (ref 5.0–8.0)

## 2024-04-11 LAB — BASIC METABOLIC PANEL WITH GFR
Anion gap: 11 (ref 5–15)
BUN: 17 mg/dL (ref 8–23)
CO2: 21 mmol/L — ABNORMAL LOW (ref 22–32)
Calcium: 9.2 mg/dL (ref 8.9–10.3)
Chloride: 105 mmol/L (ref 98–111)
Creatinine, Ser: 0.49 mg/dL (ref 0.44–1.00)
GFR, Estimated: >60 mL/min (ref >60)
Glucose, Bld: 158 mg/dL — ABNORMAL HIGH (ref 70–99)
Potassium: 4.3 mmol/L (ref 3.5–5.1)
Sodium: 137 mmol/L (ref 135–145)

## 2024-04-11 LAB — TYPE AND SCREEN
ABO/RH(D): O POS
Antibody Screen: NEGATIVE

## 2024-04-11 LAB — CBC
HCT: 31.7 % — ABNORMAL LOW (ref 36.0–46.0)
Hemoglobin: 10.7 g/dL — ABNORMAL LOW (ref 12.0–15.0)
MCH: 28.8 pg (ref 26.0–34.0)
MCHC: 33.8 g/dL (ref 30.0–36.0)
MCV: 85.2 fL (ref 80.0–100.0)
Platelets: 200 K/uL (ref 150–400)
RBC: 3.72 MIL/uL — ABNORMAL LOW (ref 3.87–5.11)
RDW: 14.6 % (ref 11.5–15.5)
WBC: 12.7 K/uL — ABNORMAL HIGH (ref 4.0–10.5)
nRBC: 0 % (ref 0.0–0.2)

## 2024-04-11 LAB — GLUCOSE, CAPILLARY
Glucose-Capillary: 123 mg/dL — ABNORMAL HIGH (ref 70–99)
Glucose-Capillary: 141 mg/dL — ABNORMAL HIGH (ref 70–99)

## 2024-04-11 LAB — ABO/RH: ABO/RH(D): O POS

## 2024-04-11 SURGERY — CRANIOTOMY HEMATOMA EVACUATION SUBDURAL
Anesthesia: General | Laterality: Left

## 2024-04-11 MED ORDER — NICARDIPINE HCL IN NACL 20-0.86 MG/200ML-% IV SOLN
3.0000 mg/h | INTRAVENOUS | Status: DC
  Administered 2024-04-11: 5 mg/h via INTRAVENOUS
  Administered 2024-04-12 (×2): 4 mg/h via INTRAVENOUS
  Administered 2024-04-12: 1 mg/h via INTRAVENOUS
  Administered 2024-04-13 (×3): 7.5 mg/h via INTRAVENOUS
  Administered 2024-04-13: 4 mg/h via INTRAVENOUS
  Administered 2024-04-13 (×2): 7.5 mg/h via INTRAVENOUS
  Administered 2024-04-14: 7 mg/h via INTRAVENOUS
  Administered 2024-04-14: 8.5 mg/h via INTRAVENOUS
  Administered 2024-04-14: 7 mg/h via INTRAVENOUS
  Administered 2024-04-14 (×2): 7.5 mg/h via INTRAVENOUS
  Administered 2024-04-14: 7 mg/h via INTRAVENOUS
  Administered 2024-04-14 (×2): 9.5 mg/h via INTRAVENOUS
  Administered 2024-04-14: 7 mg/h via INTRAVENOUS
  Filled 2024-04-11 (×16): qty 200
  Filled 2024-04-11: qty 400
  Filled 2024-04-11: qty 200
  Filled 2024-04-11: qty 400

## 2024-04-11 MED ORDER — CEFAZOLIN SODIUM-DEXTROSE 2-4 GM/100ML-% IV SOLN
INTRAVENOUS | Status: AC
  Filled 2024-04-11: qty 100

## 2024-04-11 MED ORDER — LIDOCAINE 2% (20 MG/ML) 5 ML SYRINGE
INTRAMUSCULAR | Status: AC
  Filled 2024-04-11: qty 5

## 2024-04-11 MED ORDER — SODIUM CHLORIDE 0.45 % IV SOLN: INTRAVENOUS | Status: DC

## 2024-04-11 MED ORDER — BUPIVACAINE HCL (PF) 0.5 % IJ SOLN
INTRAMUSCULAR | Status: DC | PRN
  Administered 2024-04-11: 5 mL

## 2024-04-11 MED ORDER — HEMOSTATIC AGENTS (NO CHARGE) OPTIME
TOPICAL | Status: DC | PRN
  Administered 2024-04-11: 1 via TOPICAL

## 2024-04-11 MED ORDER — ORAL CARE MOUTH RINSE: 15.0000 mL | Freq: Once | OROMUCOSAL | Status: AC

## 2024-04-11 MED ORDER — THROMBIN 20000 UNITS EX SOLR
CUTANEOUS | Status: AC
  Filled 2024-04-11: qty 20000

## 2024-04-11 MED ORDER — ONDANSETRON HCL 4 MG/2ML IJ SOLN
INTRAMUSCULAR | Status: DC | PRN
  Administered 2024-04-11: 4 mg via INTRAVENOUS

## 2024-04-11 MED ORDER — DEXAMETHASONE SOD PHOSPHATE PF 10 MG/ML IJ SOLN
INTRAMUSCULAR | Status: DC | PRN
  Administered 2024-04-11: 10 mg via INTRAVENOUS

## 2024-04-11 MED ORDER — THROMBIN 20000 UNITS EX SOLR
CUTANEOUS | Status: DC | PRN
  Administered 2024-04-11: 20 mL via TOPICAL

## 2024-04-11 MED ORDER — PHENYLEPHRINE HCL-NACL 20-0.9 MG/250ML-% IV SOLN
INTRAVENOUS | Status: DC | PRN
  Administered 2024-04-11: 40 ug/min via INTRAVENOUS

## 2024-04-11 MED ORDER — CHLORHEXIDINE GLUCONATE 0.12 % MT SOLN: 15.0000 mL | Freq: Once | OROMUCOSAL | Status: AC

## 2024-04-11 MED ORDER — SODIUM CHLORIDE 0.9 % IV SOLN: INTRAVENOUS | Status: AC

## 2024-04-11 MED ORDER — BUPIVACAINE HCL (PF) 0.5 % IJ SOLN
INTRAMUSCULAR | Status: AC
  Filled 2024-04-11: qty 30

## 2024-04-11 MED ORDER — LIDOCAINE 2% (20 MG/ML) 5 ML SYRINGE
INTRAMUSCULAR | Status: DC | PRN
  Administered 2024-04-11: 80 mg via INTRAVENOUS

## 2024-04-11 MED ORDER — HYDRALAZINE HCL 20 MG/ML IJ SOLN: 10.0000 mg | INTRAMUSCULAR | Status: DC | PRN

## 2024-04-11 MED ORDER — THROMBIN 5000 UNITS EX KIT
PACK | CUTANEOUS | Status: AC
  Filled 2024-04-11: qty 1

## 2024-04-11 MED ORDER — ROCURONIUM BROMIDE 10 MG/ML (PF) SYRINGE
PREFILLED_SYRINGE | INTRAVENOUS | Status: AC
  Filled 2024-04-11: qty 10

## 2024-04-11 MED ORDER — PROPOFOL 10 MG/ML IV BOLUS
INTRAVENOUS | Status: AC
  Filled 2024-04-11: qty 20

## 2024-04-11 MED ORDER — LABETALOL HCL 5 MG/ML IV SOLN
INTRAVENOUS | Status: DC | PRN
  Administered 2024-04-11 (×2): 5 mg via INTRAVENOUS

## 2024-04-11 MED ORDER — 0.9 % SODIUM CHLORIDE (POUR BTL) OPTIME
TOPICAL | Status: DC | PRN
  Administered 2024-04-11: 3000 mL

## 2024-04-11 MED ORDER — ROCURONIUM BROMIDE 10 MG/ML (PF) SYRINGE
PREFILLED_SYRINGE | INTRAVENOUS | Status: DC | PRN
  Administered 2024-04-11: 60 mg via INTRAVENOUS

## 2024-04-11 MED ORDER — LIDOCAINE-EPINEPHRINE 1 %-1:100000 IJ SOLN
INTRAMUSCULAR | Status: AC
  Filled 2024-04-11: qty 1

## 2024-04-11 MED ORDER — BACITRACIN ZINC 500 UNIT/GM EX OINT
TOPICAL_OINTMENT | CUTANEOUS | Status: AC
  Filled 2024-04-11: qty 28.35

## 2024-04-11 MED ORDER — LEVETIRACETAM (KEPPRA) 500 MG/5 ML ADULT IV PUSH
500.0000 mg | Freq: Two times a day (BID) | INTRAVENOUS | Status: AC
  Administered 2024-04-11 – 2024-04-15 (×10): 500 mg via INTRAVENOUS
  Filled 2024-04-11 (×10): qty 5

## 2024-04-11 MED ORDER — FENTANYL CITRATE (PF) 100 MCG/2ML IJ SOLN
INTRAMUSCULAR | Status: AC
  Filled 2024-04-11: qty 2

## 2024-04-11 MED ORDER — CHLORHEXIDINE GLUCONATE 0.12 % MT SOLN
OROMUCOSAL | Status: AC
  Administered 2024-04-11: 15 mL via OROMUCOSAL
  Filled 2024-04-11: qty 15

## 2024-04-11 MED ORDER — THROMBIN 5000 UNITS EX SOLR
OROMUCOSAL | Status: DC | PRN
  Administered 2024-04-11: 5 mL via TOPICAL

## 2024-04-11 MED ORDER — SODIUM CHLORIDE 0.9 % IV SOLN: INTRAVENOUS | Status: DC | PRN

## 2024-04-11 MED ORDER — FENTANYL CITRATE (PF) 250 MCG/5ML IJ SOLN
INTRAMUSCULAR | Status: DC | PRN
  Administered 2024-04-11 (×2): 25 ug via INTRAVENOUS
  Administered 2024-04-11: 50 ug via INTRAVENOUS

## 2024-04-11 MED ORDER — SUGAMMADEX SODIUM 200 MG/2ML IV SOLN
INTRAVENOUS | Status: DC | PRN
  Administered 2024-04-11 (×2): 100 mg via INTRAVENOUS

## 2024-04-11 MED ORDER — CEFAZOLIN SODIUM-DEXTROSE 2-3 GM-%(50ML) IV SOLR
INTRAVENOUS | Status: DC | PRN
  Administered 2024-04-11: 2 g via INTRAVENOUS

## 2024-04-11 MED ORDER — LIDOCAINE-EPINEPHRINE 1 %-1:100000 IJ SOLN
INTRAMUSCULAR | Status: DC | PRN
  Administered 2024-04-11: 5 mL

## 2024-04-11 MED ORDER — METOPROLOL TARTRATE 5 MG/5ML IV SOLN
5.0000 mg | Freq: Four times a day (QID) | INTRAVENOUS | Status: DC | PRN
  Administered 2024-04-11 – 2024-04-12 (×2): 5 mg via INTRAVENOUS
  Filled 2024-04-11 (×3): qty 5

## 2024-04-11 MED ORDER — SODIUM CHLORIDE 0.9 % IV SOLN: INTRAVENOUS | Status: DC

## 2024-04-11 MED ORDER — BACITRACIN ZINC 500 UNIT/GM EX OINT
TOPICAL_OINTMENT | CUTANEOUS | Status: DC | PRN
  Administered 2024-04-11: 1 via TOPICAL

## 2024-04-11 MED ORDER — PROPOFOL 10 MG/ML IV BOLUS
INTRAVENOUS | Status: DC | PRN
  Administered 2024-04-11: 50 mg via INTRAVENOUS

## 2024-04-11 SURGICAL SUPPLY — 57 items
BAG COUNTER SPONGE SURGICOUNT (BAG) ×1 IMPLANT
BENZOIN TINCTURE PRP APPL 2/3 (GAUZE/BANDAGES/DRESSINGS) IMPLANT
BLADE CLIPPER SURG (BLADE) ×1 IMPLANT
BNDG GAUZE DERMACEA FLUFF 4 (GAUZE/BANDAGES/DRESSINGS) IMPLANT
BUR MATCHSTICK NEURO 3.0 LAGG (BURR) IMPLANT
BUR ROUND FLUTED 5 RND (BURR) IMPLANT
BUR SPIRAL ROUTER 2.3 (BUR) IMPLANT
CANISTER SUCTION 3000ML PPV (SUCTIONS) ×1 IMPLANT
CATH ROBINSON RED A/P 14FR (CATHETERS) IMPLANT
CLIP TI MEDIUM 6 (CLIP) IMPLANT
COVER BURR HOLE UNIV 10 (Orthopedic Implant) IMPLANT
DRAPE NEUROLOGICAL W/INCISE (DRAPES) ×1 IMPLANT
DRAPE SURG 17X23 STRL (DRAPES) IMPLANT
DRAPE WARM FLUID 44X44 (DRAPES) ×1 IMPLANT
DRSG TELFA 3X8 NADH STRL (GAUZE/BANDAGES/DRESSINGS) IMPLANT
DURAPREP 6ML APPLICATOR 50/CS (WOUND CARE) ×1 IMPLANT
ELECTRODE REM PT RTRN 9FT ADLT (ELECTROSURGICAL) ×1 IMPLANT
EVACUATOR 1/8 PVC DRAIN (DRAIN) IMPLANT
EVACUATOR SILICONE 100CC (DRAIN) IMPLANT
GAUZE 4X4 16PLY ~~LOC~~+RFID DBL (SPONGE) IMPLANT
GAUZE SPONGE 4X4 12PLY STRL (GAUZE/BANDAGES/DRESSINGS) ×1 IMPLANT
GLOVE BIOGEL PI IND STRL 7.5 (GLOVE) ×3 IMPLANT
GLOVE ECLIPSE 7.0 STRL STRAW (GLOVE) ×2 IMPLANT
GOWN STRL REUS W/ TWL LRG LVL3 (GOWN DISPOSABLE) ×2 IMPLANT
GOWN STRL REUS W/ TWL XL LVL3 (GOWN DISPOSABLE) IMPLANT
GOWN STRL REUS W/TWL 2XL LVL3 (GOWN DISPOSABLE) IMPLANT
HEMOSTAT POWDER KIT SURGIFOAM (HEMOSTASIS) ×1 IMPLANT
HEMOSTAT SURGICEL 2X14 (HEMOSTASIS) IMPLANT
HOOK DURA 1/2IN (MISCELLANEOUS) ×1 IMPLANT
KIT BASIN OR (CUSTOM PROCEDURE TRAY) ×1 IMPLANT
KIT TURNOVER KIT B (KITS) ×1 IMPLANT
NDL HYPO 22X1.5 SAFETY MO (MISCELLANEOUS) ×1 IMPLANT
PACK BATTERY CMF DISP FOR DVR (ORTHOPEDIC DISPOSABLE SUPPLIES) IMPLANT
PACK CRANIOTOMY CUSTOM (CUSTOM PROCEDURE TRAY) ×1 IMPLANT
PATTIES SURGICAL .5 X3 (DISPOSABLE) IMPLANT
PLATE UNIV CMF 16 2H (Plate) IMPLANT
SCREW UNIII AXS SD 1.5X4 (Screw) IMPLANT
SET CYSTO IRRIGATION (SET/KITS/TRAYS/PACK) IMPLANT
SOL .9 NS 3000ML IRR UROMATIC (IV SOLUTION) IMPLANT
SOLN 0.9% NACL POUR BTL 1000ML (IV SOLUTION) ×1 IMPLANT
SOLN STERILE WATER BTL 1000 ML (IV SOLUTION) ×1 IMPLANT
SPONGE NEURO XRAY DETECT 1X3 (DISPOSABLE) IMPLANT
SPONGE SURGIFOAM ABS GEL 100 (HEMOSTASIS) ×1 IMPLANT
STAPLER SKIN PROX 35W (STAPLE) ×1 IMPLANT
STOCKINETTE 6 STRL (DRAPES) ×1 IMPLANT
SUT 3-0 BLK 1X30 PSL (SUTURE) IMPLANT
SUT ETHILON 3 0 PS 1 (SUTURE) IMPLANT
SUT NURALON 4 0 TR CR/8 (SUTURE) ×3 IMPLANT
SUT VIC AB 0 CT1 18XCR BRD8 (SUTURE) ×2 IMPLANT
SUT VIC AB 3-0 SH 8-18 (SUTURE) ×2 IMPLANT
TAPE CLOTH 1X10 TAN NS (GAUZE/BANDAGES/DRESSINGS) ×1 IMPLANT
TOWEL GREEN STERILE (TOWEL DISPOSABLE) ×1 IMPLANT
TOWEL GREEN STERILE FF (TOWEL DISPOSABLE) ×1 IMPLANT
TRAY FOLEY MTR SLVR 16FR STAT (SET/KITS/TRAYS/PACK) ×1 IMPLANT
TUBE CONNECTING 12X1/4 (SUCTIONS) ×1 IMPLANT
TUBING FEATHERFLOW (TUBING) IMPLANT
UNDERPAD 30X36 HEAVY ABSORB (UNDERPADS AND DIAPERS) ×1 IMPLANT

## 2024-04-11 NOTE — Progress Notes (Signed)
  NEUROSURGERY PROGRESS NOTE   No issues overnight. However patient noted to be much more confused, oriented maybe x 1 today. Repeat CTH revealed enlargement of the subdural hematoma  EXAM:  BP (!) 167/58   Pulse 79   Temp 98.8 F (37.1 C) (Oral)   Resp 16   Ht 5' 5.5 (1.664 m)   Wt 70.3 kg   SpO2 98%   BMI 25.40 kg/m   Awake, alert, oriented x2 (person, hospital) Speech fluent, appropriate  CN grossly intact  5/5 BUE/BLE   IMAGING: CTH this am reviewed and demonstrates enlargement of the left temporo-parietal convexity subdural hematoma now measuring about 1cm in thickness with associated mass effect on the left hemisphere and commensurate MLS.  IMPRESSION:  83 y.o. female s/p fall with significant enlargement of left convexity SDH. While more confused, she otherwise remains neurologically well. While there is the option for continued observation, given the significant enlargement with associated mass effect, my concern is for continued enlargement which could potentially lead to emergent life-threatening mass effect. I therefore recommend operative evacuation.  PLAN: - Will proceed with left craniotomy for evacuation of SDH.  I have reviewed the situation with the patient, her son, daughter, and husband all at bedside. We discussed the imaging findings thus far and the options for treatment including my recommendation for surgery for the reason above. We also discussed the alternatives including expectant observation. I reviewed the risks of surgery to include risk of re-accumulation, SZ, CSF leak, and infection, and general risks of anesthesia including heart attack, stroke, and blood clots. All their questions were answered and the patient and her family provided consent to proceed.   Gerldine Maizes, MD [facility]

## 2024-04-11 NOTE — Transfer of Care (Signed)
 Immediate Anesthesia Transfer of Care Note  Patient: Jeanne Ellison  Procedure(s) Performed: CRANIOTOMY HEMATOMA EVACUATION SUBDURAL (Left)  Patient Location: PACU  Anesthesia Type:General  Level of Consciousness: drowsy  Airway & Oxygen Therapy: Patient Spontanous Breathing and Patient connected to face mask oxygen  Post-op Assessment: Report given to RN and Post -op Vital signs reviewed and stable  Post vital signs: Reviewed and stable  Last Vitals:  Vitals Value Taken Time  BP 176/57 04/11/24 19:30  Temp 98.3   Pulse 80 04/11/24 19:33  Resp 16 04/11/24 19:33  SpO2 89 % 04/11/24 19:33  Vitals shown include unfiled device data.  Last Pain:  Vitals:   04/11/24 1534  TempSrc: Oral  PainSc: 4       Patients Stated Pain Goal: 2 (04/11/24 1534)  Complications: No notable events documented.

## 2024-04-11 NOTE — Progress Notes (Signed)
 Patient showing new onset of confusion. Trauma Physician and PA at bedside. Patient currently AOX1. Stat CT ordered.

## 2024-04-11 NOTE — Op Note (Signed)
 NEUROSURGERY OPERATIVE NOTE   PREOP DIAGNOSIS: Acute left subdural hematoma  POSTOP DIAGNOSIS: Same  PROCEDURE: 1. Left temporo-parietal craniotomy for evacuation of subdural hematoma  SURGEON: Dr. Gerldine Maizes, MD  ASSISTANT: Dr. Dorn Glade, MD  ANESTHESIA: General Endotracheal  EBL: Minimal  SPECIMENS: None  DRAINS: None  COMPLICATIONS: None immediate  CONDITION: Hemodynamically stable to PACU  HISTORY: Jeanne Ellison is a 83 y.o. female presenting to the hospital after a fall.  Her initial CT scan demonstrated a thin left parietal convexity subdural hematoma with minimal mass effect, stable on repeat scan.  She was monitored on the trauma floor and was noted to be more confused this morning.  Repeat CT scan demonstrated significant enlargement of the subdural hematoma with associated mass effect and midline shift.  We therefore elected to proceed with surgical decompression.  The risks, benefits, and alternative treatments were all reviewed in detail with the patient and her family at bedside.  After all questions were answered informed consent was obtained and witnessed.  PROCEDURE IN DETAIL: After informed consent was obtained and witnessed, the patient was brought to the operating room. After induction of general anesthesia, the patient was positioned on the operative table in the supine position. All pressure points were meticulously padded.  A left-sided shoulder roll was placed.  Sigmoid skin incision was then marked out above the pinna and prepped and draped in the usual sterile fashion.  After timeout was conducted, the skin incision was infiltrated with local anesthetic. Skin incision was then made sharply, and Bovie electrocautery was used to dissect the subcutaneous tissue and the galea was incised. Hemostasis was achieved on the skin edges with Raney clips. A single piece myocutaneous flap was then elevated and retracted. Bur holes were then created and a  craniotomy was fashioned and elevated. Hemostasis was then achieved on the dural surface with bipolar electrocautery. The dura was then opened in stellate fashion.  Immediately subjacent to the dura we encountered an acute subdural hematoma under some tension.  This was easily removed immediately subjacent to the craniotomy defect.  At this point, using a combination of cottonoids and the bipolar, I gently retracted the brain in order to visualize more anteriorly.  Subdural hematoma overlying the frontal lobe was then removed until I was able to see the frontal pole.  In a similar fashion, I worked more superiorly, with minimal subdural hematoma noted.  Attention was then turned posteriorly where a thick portion of subdural hematoma overlying the occipital lobe was identified coinciding with the preoperative CT scan.  As I remove this thick portion of occipital clot, I did notice active arterial bleeding from the brain surface on the temporal occipital junction.  This was easily controlled with bipolar electrocautery.  I was then able to remove the remainder of the subdural hematoma overlying the occipital lobe essentially back to the occipital pole.  Finally, attention was turned to the more inferior portion of the subdural hematoma which was easily removed and I was able to see the floor of the middle cranial fossa.  There was a portion of the subdural hematoma lying along the floor of the middle fossa which I was unable to access given the approach.  At this point the wound is irrigated with copious amounts of normal saline irrigation. Good hemostasis was confirmed on the brain surface. The dura was then closed using a combination of interrupted and continuous 4-0 Nurolon stitches. A piece of gelfoam was then placed over the dural surface. Muscle  was then approximated using interrupted 0 Vicryl stitches, and the galea was closed using interrupted 3-0 Vicryl sutures. The skin was closed using standard surgical  skin staples. Sterile dressing was then applied. The patient was then transferred to the stretcher and taken to the PACU in stable hemodynamic condition.  At the end of the case all sponge, needle, instrument, and cottonoid counts were correct.  Gerldine Maizes, MD [facility]

## 2024-04-11 NOTE — Progress Notes (Signed)
 2205: Dr. Ann paged d/t sys BP >160, PRN metoprolol given w/ no therapeutic affect and pt not due for PRN hydralazine. Order placed for Nicardapine gtt.   2230: While hanging Nicardipine gtt, pt observed to be picking at arterial line dressing. Neuro assessment performed and significant mental status change noted. Pt w/ new dysarthria and increased disorientation, as well as increased bilateral lower extremity weakness. Pt also more lethargic and having difficulty participating  in assessment. Dr. Ann and Dr. Darnella w/ neurosurgery paged. Order for STAT CT scan placed. CT scan obtained and reviewed. Dr. Darnella updated. Orders to continue to monitor at this time.

## 2024-04-11 NOTE — Progress Notes (Addendum)
 Patient ID: Jeanne Ellison, female   DOB: 04-16-1941, 83 y.o.   MRN: [id number] Follow up - Trauma Critical Care   Patient Details:    Jeanne Ellison is an 83 y.o. female.  Anti-infectives:  Anti-infectives (From admission, onward)    None     Consults: Treatment Team:  Md, Trauma, MD  neurosurgery  Subjective:    Overnight Issues:  Patient is repetitive and after many attempts is able to tell me her name. She is not  following commands. She is moving all extremities.  Objective:  Vital signs for last 24 hours: Temp:  [97.9 F (36.6 C)-100.6 F (38.1 C)] 98.3 F (36.8 C) (11/12 0324) Pulse Rate:  [65-84] 69 (11/12 0347) Resp:  [10-27] 27 (11/12 0347) BP: (124-216)/(54-77) 162/66 (11/12 0347) SpO2:  [90 %-97 %] 97 % (11/12 0347)  Hemodynamic parameters for last 24 hours:    Intake/Output from previous day: 11/11 0701 - 11/12 0700 In: 531.8 [P.O.:120; I.V.:406.8; IV Piggyback:5] Out: 900 [Urine:900]  Intake/Output this shift: No intake/output data recorded.  Vent settings for last 24 hours:    Physical Exam:  Alert, eyes open, NAD CV: RRR, no lower extremity edema  Pulm: normal effort Abd: soft  Neuro: E4V3-4M5=12-13 MSK: R had edema  Results for orders placed or performed during the hospital encounter of 04/09/24 (from the past 24 hours)  CBC     Status: Abnormal   Collection Time: 04/11/24  3:43 AM  Result Value Ref Range   WBC 12.7 (H) 4.0 - 10.5 K/uL   RBC 3.72 (L) 3.87 - 5.11 MIL/uL   Hemoglobin 10.7 (L) 12.0 - 15.0 g/dL   HCT 68.2 (L) 63.9 - 53.9 %   MCV 85.2 80.0 - 100.0 fL   MCH 28.8 26.0 - 34.0 pg   MCHC 33.8 30.0 - 36.0 g/dL   RDW 85.3 88.4 - 84.4 %   Platelets 200 150 - 400 K/uL   nRBC 0.0 0.0 - 0.2 %  Basic metabolic panel     Status: Abnormal   Collection Time: 04/11/24  3:43 AM  Result Value Ref Range   Sodium 137 135 - 145 mmol/L   Potassium 4.3 3.5 - 5.1 mmol/L   Chloride 105 98 - 111 mmol/L   CO2 21 (L) 22 - 32 mmol/L    Glucose, Bld 158 (H) 70 - 99 mg/dL   BUN 17 8 - 23 mg/dL   Creatinine, Ser 9.50 0.44 - 1.00 mg/dL   Calcium 9.2 8.9 - 89.6 mg/dL   GFR, Estimated >39 >39 mL/min   Anion gap 11 5 - 15    Assessment & Plan: Present on Admission:  SDH (subdural hematoma) (HCC)    LOS: 2 days   Additional comments:I reviewed the patient's new clinical lab test results. / 83 y/o F GLF  R posterior scalp hematoma L subdural hematoma with 3mm midline shift - per Dr. Lanis, keppra, F/U CT H stable11/10, neuro status change this morning >> STAT CT HEAD shows increased left SDH with increased midline shift and early uncal herniation. Dr. Paola calling Dr. Lanis this AM to discuss.  Acute hypoxic ventilator dependent respiratory failure - extubated 11/11.  HTN - ICH parameters, BP < 150 mmhg systolic, was on cleviprex but this is off now, BP 160-190, may need to resume  HLD Diabetes mellitus   R hand swelling - check x-ray   FEN - NPO, SLP eval and cortrak today  VTE - SCD's, chemical VTE held due to  intracranial hemorrhage ID - none; TMAX 100.6, WBC 12.7 from 8.7, check resp cultures, UA 11/12 Foley- placed 11/10 Dispo - 4NP, will discuss with my attending but I think she would benefit from transfer to the ICU for frequent neuro checks    Almarie Pringle, [facility] Surgery Please see Amion for pager number during day hours 7:00am-4:30pm      04/11/2024  *Care during the described time interval was provided by me. I have reviewed this patient's available data, including medical history, events of note, physical examination and test results as part of my evaluation.

## 2024-04-11 NOTE — Progress Notes (Signed)
 PT Cancellation Note  Patient Details Name: Jeanne Ellison MRN: [id number] DOB: 12/18/1940   Cancelled Treatment:    Reason Eval/Treat Not Completed: Medical issues which prohibited therapy  Noted plans to transfer to ICU due to incr SDH. Will follow for appropriateness to resume PT over next several days.    Macario RAMAN, PT Acute Rehabilitation Services  Office [phone]  Macario SHAUNNA Soja 04/11/2024, 10:18 AM

## 2024-04-11 NOTE — Progress Notes (Signed)
 Patient transported to CT by bed with primary nurse.

## 2024-04-11 NOTE — Progress Notes (Signed)
 Patient seen and examined. Oriented only to self. Documented orientation x4 yesterday by staff. Stat repeat head CT ordered and notable for increased SDH to 11mm with 8mm MLS, subfalcine and early uncal herniation. Dr. Lanis notified at [phone] and will review imaging. Will plan to transfer to ICU for closer monitoring in the interim. Will reach out to family to provide clinical update.   Dreama GEANNIE Hanger, MD General and Trauma Surgery [facility]

## 2024-04-11 NOTE — Anesthesia Procedure Notes (Signed)
 Arterial Line Insertion Start/End11/04/2024 3:50 PM, 04/11/2024 4:00 PM Performed by: Peggye Delon Brunswick, MD, Atanacio Arland HERO, CRNA, CRNA  Patient location: Pre-op. Preanesthetic checklist: patient identified, IV checked, site marked, risks and benefits discussed, surgical consent, monitors and equipment checked, pre-op evaluation, timeout performed and anesthesia consent Lidocaine 1% used for infiltration Right, radial was placed Catheter size: 20 G Hand hygiene performed  and maximum sterile barriers used   Attempts: 2 Following insertion, dressing applied and Biopatch. Post procedure assessment: normal and unchanged  Post procedure complications: local hematoma. Patient tolerated the procedure well with no immediate complications. Additional procedure comments: Stick x 1 by Ripley, SRNA unsuccessful.

## 2024-04-11 NOTE — Progress Notes (Signed)
 Discussed patient with NSGY, Dr. Lanis, and tentative plan for OR this PM. Patient seen and examined after transfer to ICU and discussion with her husband about patient's clinical change. At the time of this evaluation, patient had an improvement in her neuro status. She is briskly responsive with correct answers to orientation questions to self, place, situation, and month and year. She remains unable to follow commands for me, however the nurse reports that she did follow some commands after mimicking an action performed. Notified NSGY of improvement in neuro exam and he will exam her personally before making a final decision regarding surgery.   Discussion with patient and husband regarding code status and trach/PEG. For now, he elects to continue with full code on her behalf.   Critical care time:  Dreama GEANNIE Hanger, MD General and Trauma Surgery [facility]

## 2024-04-11 NOTE — Progress Notes (Signed)
 Patient transported to 4N-ICU 24. Report given to 4NICU nurse.

## 2024-04-11 NOTE — Progress Notes (Signed)
 Speech Language Pathology Treatment: Cognitive-Linguistic  Patient Details Name: Jeanne Ellison MRN: [id number] DOB: 05-24-1941 Today's Date: 04/11/2024 Time: 8985-8971 SLP Time Calculation (min) (ACUTE ONLY): 14 min  Assessment / Plan / Recommendation Clinical Impression  Patient was alert and awake and finishing up with nursing upon SLP arrival. Patient's alertness and voice have improved since visit yesterday afternoon. Patient was oriented to person and place but not oriented to time and situation. Patient shared she was in the hospital due to a heart attack. When SLP told her about her fall she responded, that's right. Patient's husband was at bedside. Patient required visual cues to perform four one step commands, which she completed yesterday with 100% accuracy independently. Patient continued to perseverate on past commands when asked new questions, for example continuing to stick out tongue when asked to show her teeth. Patient was able to answer basic questions but when asked follow up questions, she would respond with the same response. 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 SLP observed an increase in phonemic errors as the session progressed. Patient did not show awareness to these errors. Patient was made NPO and surgery PA requested SLP reassess patient's cognition and swallow function. BSE ordered but MD requested patient be kept NPO until cognition improves. SLP will continue to follow for cognitive treatment and will follow up with BSE when cleared by MD.    HPI HPI: Patient is an 83 y.o. female who presented as a level 1 trauma on 11/10. History was provided by EMS. Patient reportedly found down at home, last known normal was 8:04am. Per husband, she was found down at the foot of the bed, he was unsure if this was related to the vertigo that she had previous experiences with in 2016&2019. He said that she  had talked about her balance not being as good. Per EMS, she was reportedly making sounds but not forming words, swinging all extremities in . She was agitated in the ED and was intubated for airway protection (11/10) and further workup. Patient was extubated on 11/11. Per husband, patient struggles keeping her bloodsugar levels in her perscribed range. Cognitive langauge evaluation ordered to assess patient's cognition. Patient was seen morning of 11/12, and was oriented only to set, different from her orientation X 4 yesterday. A stat repeat head CT was ordered and notable for increased SDH to 11mm with 8mm MLS, subfalcine and early uncal herniation. Moved patient to ICU for closer monitoring. Patient made NPO and surgery PA requested SLP reassess patient's cognition and swallow function.       SLP Plan  Continue with current plan of care          Recommendations                     Oral care BID     Cognitive communication deficit (R41.841)     Continue with current plan of care     Damien Hy  Graduate SLP Clinican

## 2024-04-11 NOTE — Progress Notes (Signed)
 OT Cancellation Note  Patient Details Name: Jeanne Ellison MRN: [id number] DOB: 11/02/1940   Cancelled Treatment:    Reason Eval/Treat Not Completed: Patient not medically ready (per chart review, tennative plan for OR this afternoon. Will follow up as pt medically ready.)   [name], OTD, OTR/L [facility] Acute Rehabilitation Office: [phone]  [name] 04/11/2024, 1:04 PM

## 2024-04-11 NOTE — Progress Notes (Signed)
 PT Cancellation Note  Patient Details Name: Jeanne Ellison MRN: [id number] DOB: 28-Jul-1940   Cancelled Treatment:    Reason Eval/Treat Not Completed: Patient at procedure or test/unavailable  Pt off the unit at CT.    Macario RAMAN, PT Acute Rehabilitation Services  Office [phone]  Macario SHAUNNA Soja 04/11/2024, 8:37 AM

## 2024-04-11 NOTE — Progress Notes (Addendum)
 1355- Clarified BP goals with Dr. Paola, normal bleed parameters are 130-150, current orders are <180. Orders updated to goals of 130-150.  1405-Daughter Mandy at bedside, updated on current POC, all questions answered.   1445- Report given to Pre-op RN, family was under the impression that Dr. Lanis would come to bedside to discuss surgery with Family. However, since the plan is to proceed with surgery regardless Dr. Lanis will speak with family and obtain consent in Pre-op. Patient was transported to Pre-op at 1530 with family (Husband, Daughter & Friend). Hand off given to Pre-op RNs.

## 2024-04-11 NOTE — Anesthesia Postprocedure Evaluation (Signed)
 Anesthesia Post Note  Patient: Jeanne Ellison  Procedure(s) Performed: CRANIOTOMY HEMATOMA EVACUATION SUBDURAL (Left)     Patient location during evaluation: PACU Anesthesia Type: General Level of consciousness: awake and alert Pain management: pain level controlled Vital Signs Assessment: post-procedure vital signs reviewed and stable Respiratory status: spontaneous breathing, nonlabored ventilation, respiratory function stable and patient connected to nasal cannula oxygen Cardiovascular status: blood pressure returned to baseline and stable Postop Assessment: no apparent nausea or vomiting Anesthetic complications: no   No notable events documented.  Last Vitals:  Vitals:   04/11/24 1945 04/11/24 2000  BP: (!) 148/58 (!) 145/96  Pulse: 80 80  Resp: 19 15  Temp:  36.8 C  SpO2: 95% 93%    Last Pain:  Vitals:   04/11/24 2000  TempSrc:   PainSc: 0-No pain                 Aviannah Castoro,W. EDMOND

## 2024-04-11 NOTE — Progress Notes (Signed)
 Initial Nutrition Assessment  DOCUMENTATION CODES:   Non-severe (moderate) malnutrition in context of chronic illness  INTERVENTION:   Recommend post op  Initiate tube feeding via Cortrak tube: Osmolite 1.5 at 25 ml/h and increase by 10 ml every 8 hours to goal rate of 45 ml/hr (1080 ml per day)  Prosource TF20 60 ml daily  Provides 1700 kcal, 87 gm protein, 820 ml free water daily  Monitor magnesium and phosphorus daily x 4 occurrences, MD to replete as needed, as pt is at risk for refeeding syndrome given pt meets criteria for moderate malnutrition.  Discussed cortrak tube placement with pt and husband.   NUTRITION DIAGNOSIS:   Moderate Malnutrition related to chronic illness as evidenced by mild muscle depletion, energy intake < or equal to 75% for > or equal to 1 month, moderate muscle depletion.  GOAL:   Patient will meet greater than or equal to 90% of their needs  MONITOR:   TF tolerance, Labs  REASON FOR ASSESSMENT:   Rounds (cortrak)    ASSESSMENT:   Pt with PMH of DM, HLD, and HTN admitted after GLF with R posterior scalp hematoma, L SDH with 3 mm midline shift, R hand swelling.   Pt discussed during ICU rounds and with RN and MD.  Pt currently in the OR. Earlier today pt on 4NP when noted to have neuro change, head CT showed increased SDH to 11 mm from 3 mm previously. Pt tx to ICU.   After ICU rounds RD spoke with pt and her husband. After transferring to the ICU this morning pt was more alert and able to answer questions about intake and weight hx which husband confirmed. However did report her age as 89 instead of 55. Pt lives with husband and does not smoke or drink ETOH.  Breakfast is cottage cheese and peaches, Lunch is cooked at home (chicken and dumplings). She does not always eat a dinner, if she does it could be a peanut butter sandwich or popcorn. She does this to help her blood sugars. Per husband she checks her blood sugar frequently, only takes  metformin at home, confirmed on home meds. Per husband she struggles with blood sugar control; no A1C available. Pt does not feel she has lost weight. Reports her weight as 165 lb. Admission weight is 155 but unsure of accuracy. Per pt she uses a cane sometimes to get around house.   11/10 - admission; intubated due to neuro status 11/11 - extubated  11/12 - Pt tx to ICU due to increasing shift from 3 mm to 11 mm; s/p cortrak placement with tip gastric   Medications reviewed and include: colace, protonix, miralax  NS @ 50 ml/hr   Labs reviewed:  CBG 123 (x1) No A1C    NUTRITION - FOCUSED PHYSICAL EXAM:  Flowsheet Row Most Recent Value  Orbital Region No depletion  Upper Arm Region No depletion  Thoracic and Lumbar Region No depletion  Buccal Region Mild depletion  Temple Region No depletion  Clavicle Bone Region Mild depletion  Clavicle and Acromion Bone Region Moderate depletion  Scapular Bone Region Moderate depletion  Dorsal Hand Unable to assess  Patellar Region Mild depletion  Anterior Thigh Region Mild depletion  Posterior Calf Region Severe depletion  Edema (RD Assessment) Mild  [BUE]  Hair Reviewed  Eyes Reviewed  Mouth Reviewed  Skin Reviewed  Nails Reviewed    Diet Order:   Diet Order  Diet NPO time specified  Diet effective now                   EDUCATION NEEDS:   Education needs have been addressed  Skin:  Skin Assessment: Reviewed RN Assessment  Last BM:  unknown  Height:   Ht Readings from Last 1 Encounters:  04/09/24 5' 5.5 (1.664 m)    Weight:   Wt Readings from Last 1 Encounters:  04/09/24 70.3 kg    BMI:  Body mass index is 25.4 kg/m.  Estimated Nutritional Needs:   Kcal:  1600-1800  Protein:  85-100 grams  Fluid:  >1.6 L/day  Powell SQUIBB., RD, LDN, CNSC See AMiON for contact information

## 2024-04-11 NOTE — Anesthesia Preprocedure Evaluation (Signed)
 Anesthesia Evaluation  Patient identified by MRN, date of birth, ID band Patient awake    Reviewed: Allergy & Precautions, NPO status , Patient's Chart, lab work & pertinent test results  History of Anesthesia Complications Negative for: history of anesthetic complications  Airway Mallampati: II  TM Distance: >3 FB Neck ROM: Full    Dental  (+) Dental Advisory Given, Chipped,    Pulmonary neg pulmonary ROS   Pulmonary exam normal breath sounds clear to auscultation       Cardiovascular hypertension (bisoprolol-HCTZ), Pt. on medications (-) angina (-) Past MI, (-) Cardiac Stents and (-) CABG (-) dysrhythmias  Rhythm:Regular Rate:Normal     Neuro/Psych neg Seizures SDH    GI/Hepatic negative GI ROS, Neg liver ROS,,,  Endo/Other  diabetes, Type 2, Oral Hypoglycemic Agents    Renal/GU negative Renal ROS     Musculoskeletal   Abdominal   Peds  Hematology  (+) Blood dyscrasia, anemia Lab Results      Component                Value               Date                      WBC                      12.7 (H)            04/11/2024                HGB                      10.7 (L)            04/11/2024                HCT                      31.7 (L)            04/11/2024                MCV                      85.2                04/11/2024                PLT                      200                 04/11/2024              Anesthesia Other Findings 83 y/o F found down  R posterior scalp hematoma L subdural hematoma with 3mm midline shift   Reproductive/Obstetrics                              Anesthesia Physical Anesthesia Plan  ASA: 4  Anesthesia Plan: General   Post-op Pain Management:    Induction: Intravenous  PONV Risk Score and Plan: 3 and Ondansetron, Dexamethasone and Treatment may vary due to age or medical condition  Airway Management Planned: Oral ETT  Additional Equipment:  Arterial line  Intra-op Plan:   Post-operative Plan: Extubation in OR  Informed Consent:  I have reviewed the patients History and Physical, chart, labs and discussed the procedure including the risks, benefits and alternatives for the proposed anesthesia with the patient or authorized representative who has indicated his/her understanding and acceptance.     Dental advisory given  Plan Discussed with: CRNA and Anesthesiologist  Anesthesia Plan Comments: (Risks of general anesthesia discussed including, but not limited to, sore throat, hoarse voice, chipped/damaged teeth, injury to vocal cords, nausea and vomiting, allergic reactions, lung infection, heart attack, stroke, and death. All questions answered. )         Anesthesia Quick Evaluation

## 2024-04-11 NOTE — Anesthesia Procedure Notes (Signed)
 Procedure Name: Intubation Date/Time: 04/11/2024 6:01 PM  Performed by: Atanacio Arland HERO, CRNAPre-anesthesia Checklist: Patient identified, Emergency Drugs available, Suction available and Patient being monitored Patient Re-evaluated:Patient Re-evaluated prior to induction Oxygen Delivery Method: Circle System Utilized Preoxygenation: Pre-oxygenation with 100% oxygen Induction Type: IV induction Ventilation: Mask ventilation without difficulty Laryngoscope Size: Mac and 3 Grade View: Grade II Tube type: Oral Tube size: 7.0 mm Number of attempts: 1 Airway Equipment and Method: Stylet Placement Confirmation: ETT inserted through vocal cords under direct vision, positive ETCO2 and breath sounds checked- equal and bilateral Secured at: 22 cm Tube secured with: Tape Dental Injury: Teeth and Oropharynx as per pre-operative assessment

## 2024-04-11 NOTE — Procedures (Signed)
 Cortrak  Person Inserting Tube:  Jeanne Ellison, RD Tube Type:  Cortrak - 43 inches Tube Size:  10 Tube Location:  Left nare Secured by: Bridle Initial Placement:  Gastric Technique Used to Measure Tube Placement:  Marking at nare/corner of mouth Cortrak Secured At:  62 cm Initial Placement Verification:  Cortrak device (Registered Dieticians Only)  Cortrak Tube Team Note:  Consult received to place a Cortrak feeding tube.   No x-ray is required. RN may begin using tube.   If the tube becomes dislodged please keep the tube and contact the Cortrak team at [email or web address] for replacement.  If after hours and replacement cannot be delayed, place a NG tube and confirm placement with an abdominal x-ray.    Jeanne Elihue, MS, RDN, LDN Clinical Dietitian I Please reach out via secure chat

## 2024-04-12 ENCOUNTER — Inpatient Hospital Stay (HOSPITAL_COMMUNITY)

## 2024-04-12 LAB — CBC
HCT: 27.7 % — ABNORMAL LOW (ref 36.0–46.0)
Hemoglobin: 9.4 g/dL — ABNORMAL LOW (ref 12.0–15.0)
MCH: 29.1 pg (ref 26.0–34.0)
MCHC: 33.9 g/dL (ref 30.0–36.0)
MCV: 85.8 fL (ref 80.0–100.0)
Platelets: 195 K/uL (ref 150–400)
RBC: 3.23 MIL/uL — ABNORMAL LOW (ref 3.87–5.11)
RDW: 15.1 % (ref 11.5–15.5)
WBC: 15.2 K/uL — ABNORMAL HIGH (ref 4.0–10.5)
nRBC: 0 % (ref 0.0–0.2)

## 2024-04-12 LAB — BASIC METABOLIC PANEL WITH GFR
Anion gap: 11 (ref 5–15)
BUN: 17 mg/dL (ref 8–23)
CO2: 18 mmol/L — ABNORMAL LOW (ref 22–32)
Calcium: 8.8 mg/dL — ABNORMAL LOW (ref 8.9–10.3)
Chloride: 106 mmol/L (ref 98–111)
Creatinine, Ser: 0.47 mg/dL (ref 0.44–1.00)
GFR, Estimated: >60 mL/min (ref >60)
Glucose, Bld: 160 mg/dL — ABNORMAL HIGH (ref 70–99)
Potassium: 3.2 mmol/L — ABNORMAL LOW (ref 3.5–5.1)
Sodium: 135 mmol/L (ref 135–145)

## 2024-04-12 LAB — GLUCOSE, CAPILLARY
Glucose-Capillary: 149 mg/dL — ABNORMAL HIGH (ref 70–99)
Glucose-Capillary: 153 mg/dL — ABNORMAL HIGH (ref 70–99)
Glucose-Capillary: 158 mg/dL — ABNORMAL HIGH (ref 70–99)
Glucose-Capillary: 161 mg/dL — ABNORMAL HIGH (ref 70–99)
Glucose-Capillary: 175 mg/dL — ABNORMAL HIGH (ref 70–99)
Glucose-Capillary: 202 mg/dL — ABNORMAL HIGH (ref 70–99)
Glucose-Capillary: 217 mg/dL — ABNORMAL HIGH (ref 70–99)

## 2024-04-12 LAB — MAGNESIUM: Magnesium: 1.7 mg/dL (ref 1.7–2.4)

## 2024-04-12 MED ORDER — DOCUSATE SODIUM 50 MG/5ML PO LIQD
100.0000 mg | Freq: Two times a day (BID) | ORAL | Status: DC
  Administered 2024-04-12 – 2024-04-17 (×8): 100 mg
  Filled 2024-04-12 (×11): qty 10

## 2024-04-12 MED ORDER — POTASSIUM CHLORIDE 20 MEQ PO PACK
40.0000 meq | PACK | ORAL | Status: AC
  Administered 2024-04-12 (×2): 40 meq
  Filled 2024-04-12 (×2): qty 2

## 2024-04-12 MED ORDER — OSMOLITE 1.5 CAL PO LIQD
1000.0000 mL | ORAL | Status: DC
  Administered 2024-04-12: 1000 mL

## 2024-04-12 MED ORDER — BISOPROLOL FUMARATE 5 MG PO TABS
2.5000 mg | ORAL_TABLET | Freq: Every day | ORAL | Status: DC
  Administered 2024-04-12 – 2024-04-14 (×3): 2.5 mg
  Filled 2024-04-12 (×4): qty 0.5

## 2024-04-12 MED ORDER — ACETAMINOPHEN 500 MG PO TABS
1000.0000 mg | ORAL_TABLET | Freq: Four times a day (QID) | ORAL | Status: DC
  Administered 2024-04-12 – 2024-04-18 (×21): 1000 mg
  Filled 2024-04-12 (×23): qty 2

## 2024-04-12 MED ORDER — TRAMADOL HCL 50 MG PO TABS
50.0000 mg | ORAL_TABLET | Freq: Four times a day (QID) | ORAL | Status: DC | PRN
  Administered 2024-04-12 – 2024-04-14 (×4): 50 mg
  Filled 2024-04-12 (×4): qty 1

## 2024-04-12 MED ORDER — CITALOPRAM HYDROBROMIDE 10 MG PO TABS
20.0000 mg | ORAL_TABLET | Freq: Every day | ORAL | Status: DC
  Administered 2024-04-12 – 2024-04-18 (×7): 20 mg
  Filled 2024-04-12 (×7): qty 2

## 2024-04-12 MED ORDER — THIAMINE MONONITRATE 100 MG PO TABS
100.0000 mg | ORAL_TABLET | Freq: Every day | ORAL | Status: DC
  Administered 2024-04-12: 100 mg
  Filled 2024-04-12 (×2): qty 1

## 2024-04-12 MED ORDER — POLYETHYLENE GLYCOL 3350 17 G PO PACK
17.0000 g | PACK | Freq: Every day | ORAL | Status: DC
  Administered 2024-04-12 – 2024-04-17 (×5): 17 g
  Filled 2024-04-12 (×5): qty 1

## 2024-04-12 MED ORDER — PROSOURCE TF20 ENFIT COMPATIBL EN LIQD
60.0000 mL | Freq: Every day | ENTERAL | Status: DC
  Administered 2024-04-12 – 2024-04-18 (×7): 60 mL
  Filled 2024-04-12 (×8): qty 60

## 2024-04-12 MED ORDER — BISOPROLOL-HYDROCHLOROTHIAZIDE 2.5-6.25 MG PO TABS
1.0000 | ORAL_TABLET | Freq: Every day | ORAL | Status: DC
  Filled 2024-04-12: qty 1

## 2024-04-12 MED ORDER — HYDROCHLOROTHIAZIDE 12.5 MG PO TABS
6.2500 mg | ORAL_TABLET | Freq: Every day | ORAL | Status: DC
  Administered 2024-04-12 – 2024-04-14 (×3): 6.25 mg
  Filled 2024-04-12 (×3): qty 1

## 2024-04-12 MED ORDER — MAGNESIUM SULFATE 2 GM/50ML IV SOLN
2.0000 g | Freq: Once | INTRAVENOUS | Status: AC
  Administered 2024-04-12: 2 g via INTRAVENOUS
  Filled 2024-04-12: qty 50

## 2024-04-12 MED ORDER — ADULT MULTIVITAMIN W/MINERALS CH
1.0000 | ORAL_TABLET | Freq: Every day | ORAL | Status: DC
  Administered 2024-04-12 – 2024-04-18 (×7): 1
  Filled 2024-04-12 (×7): qty 1

## 2024-04-12 NOTE — Progress Notes (Signed)
 Nutrition Brief Note  Pt discussed during ICU rounds and with RN and MD.  Consult received to initiate enteral nutrition via Cortrak tube, not ready for SLP eval.    11/10 - admission; intubated due to neuro status 11/11 - extubated  11/12 - Pt tx to ICU due to increasing shift from 3 mm to 11 mm; s/p cortrak placement with tip gastric; s/p crani PM 11/13 - start TF  K 3.2 Mag 1.7 Phos pending   Initiate tube feeding via Cortrak tube: Osmolite 1.5 at 25 ml/h and increase by 10 ml every 8 hours to goal rate of 45 ml/hr (1080 ml per day)   Prosource TF20 60 ml daily   Provides 1700 kcal, 87 gm protein, 820 ml free water daily   Add  MVI with minerals daily  100 mg thiamine daily x 7 days  Monitor magnesium and phosphorus daily x 4 occurrences, MD to replete as needed, as pt is at risk for refeeding syndrome given pt meets criteria for moderate malnutrition.  Powell SQUIBB., RD, LDN, CNSC See AMiON for contact information

## 2024-04-12 NOTE — Progress Notes (Signed)
 Patient ID: Jeanne Ellison, female   DOB: 11-Jan-1941, 83 y.o.   MRN: [id number] I updated her daughter at the bedside.  Dann Hummer, MD, MPH, FACS Please use [email or web address] to contact on call provider

## 2024-04-12 NOTE — Progress Notes (Signed)
  NEUROSURGERY PROGRESS NOTE   No issues overnight, has been more confused this am.  EXAM:  BP (!) 153/45   Pulse 81   Temp 99.9 F (37.7 C)   Resp 20   Ht 5' 5.5 (1.664 m)   Wt 70.3 kg   SpO2 94%   BMI 25.40 kg/m   Awake, alert Intermittently follows commands, occasionally answers simple questions. CN grossly intact  5/5 BUE/BLE   IMPRESSION:  83 y.o. female POD#1 left crani for SDH, was more appropriate immediately postop, now much more confused. ?SZ. She had a similar episode yesterday prior to surgery.  PLAN: - Will get LTEEG, cont Keppra. - Cont supportive care   Gerldine Maizes, MD [facility]

## 2024-04-12 NOTE — Progress Notes (Addendum)
 LTM EEG hooked up and running - no initial skin breakdown - push button tested - [facility] monitoring.

## 2024-04-12 NOTE — Progress Notes (Signed)
 Ellison ID: Jeanne Ellison, female   DOB: 08-Jan-1941, 83 y.o.   MRN: [id number] Follow up - Trauma Critical Care   Ellison Details:    Jeanne Ellison is an 83 y.o. female.  Lines/tubes : Arterial Line 04/11/24 Right Radial (Active)  Site Assessment Clean, Dry, Intact 04/11/24 2030  Line Status Pulsatile blood flow 04/11/24 2030  Art Line Waveform Appropriate;Square wave test performed 04/11/24 2030  Art Line Interventions Zeroed and calibrated;Leveled;Flushed per protocol 04/11/24 2030  Color/Movement/Sensation Capillary refill less than 3 sec 04/11/24 2030  Dressing Type Transparent 04/11/24 2030  Dressing Status Clean, Dry, Intact 04/11/24 2030  Dressing Change Due 04/18/24 04/11/24 2030     Urethral Catheter Devin Latex 16 Fr. (Active)  Indication for Insertion or Continuance of Catheter Peri-operative use for selective surgical procedure - not to exceed 24 hours post-op 04/11/24 2030  Site Assessment Clean, Dry, Intact 04/11/24 2030  Catheter Maintenance Bag below level of bladder;Catheter secured;Drainage bag/tubing not touching floor;Insertion date on drainage bag;No dependent loops;Seal intact 04/11/24 2030  Collection Container Standard drainage bag 04/11/24 2030  Securement Method Adhesive securement device 04/11/24 2030  Urinary Catheter Interventions (if applicable) Unclamped 04/11/24 1926  Output (mL) 20 mL 04/12/24 1000    Microbiology/Sepsis markers: Results for orders placed or performed during the hospital encounter of 04/09/24  MRSA Next Gen by PCR, Nasal     Status: None   Collection Time: 04/09/24 12:21 PM   Specimen: Nasal Mucosa; Nasal Swab  Result Value Ref Range Status   MRSA by PCR Next Gen NOT DETECTED NOT DETECTED Final    Comment: (NOTE) The GeneXpert MRSA Assay (FDA approved for NASAL specimens only), is one component of a comprehensive MRSA colonization surveillance program. It is not intended to diagnose MRSA infection nor to guide or  monitor treatment for MRSA infections. Test performance is not FDA approved in patients less than 2 years old. Performed at [facility], [address], [place], [place] [address]     Anti-infectives:  Anti-infectives (From admission, onward)    Start     Dose/Rate Route Frequency Ordered Stop   04/11/24 1600  ceFAZolin (ANCEF) 2-4 GM/100ML-% IVPB       Note to Pharmacy: Atanacio Race: cabinet override      04/11/24 1600 04/12/24 0414      Consults: Treatment Team:  Md, Trauma, MD Lanis Pupa, MD    Studies:    Events:  Subjective:    Overnight Issues:  Mult PVCs, not oriented completely Objective:  Vital signs for last 24 hours: Temp:  [98.3 F (36.8 C)-100.2 F (37.9 C)] 99.9 F (37.7 C) (11/13 1115) Pulse Rate:  [60-108] 108 (11/13 1115) Resp:  [12-32] 32 (11/13 1115) BP: (115-183)/(45-103) 137/55 (11/13 1115) SpO2:  [88 %-100 %] 94 % (11/13 1115) Arterial Line BP: (130-192)/(43-67) 130/54 (11/13 1115)  Hemodynamic parameters for last 24 hours:    Intake/Output from previous day: 11/12 0701 - 11/13 0700 In: 2300.3 [I.V.:2135.3; NG/GT:50; IV Piggyback:115] Out: 1085 [Urine:1060; Blood:25]  Intake/Output this shift: Total I/O In: 120 [NG/GT:120] Out: 160 [Urine:160]  Vent settings for last 24 hours:    Physical Exam:  General: alert and no respiratory distress Neuro: oriented to place but not year Resp: clear to auscultation bilaterally CVS: RRR freq ectomy GI: soft, NT Extremities: calves soft  Results for orders placed or performed during the hospital encounter of 04/09/24 (from the past 24 hours)  Glucose, capillary     Status: Abnormal  Collection Time: 04/11/24  2:24 PM  Result Value Ref Range   Glucose-Capillary 123 (H) 70 - 99 mg/dL  Glucose, capillary     Status: Abnormal   Collection Time: 04/11/24  7:30 PM  Result Value Ref Range   Glucose-Capillary 141 (H) 70 - 99 mg/dL  Glucose, capillary     Status: Abnormal    Collection Time: 04/12/24 12:07 AM  Result Value Ref Range   Glucose-Capillary 153 (H) 70 - 99 mg/dL  Glucose, capillary     Status: Abnormal   Collection Time: 04/12/24  3:26 AM  Result Value Ref Range   Glucose-Capillary 161 (H) 70 - 99 mg/dL  CBC     Status: Abnormal   Collection Time: 04/12/24  4:08 AM  Result Value Ref Range   WBC 15.2 (H) 4.0 - 10.5 K/uL   RBC 3.23 (L) 3.87 - 5.11 MIL/uL   Hemoglobin 9.4 (L) 12.0 - 15.0 g/dL   HCT 72.2 (L) 63.9 - 53.9 %   MCV 85.8 80.0 - 100.0 fL   MCH 29.1 26.0 - 34.0 pg   MCHC 33.9 30.0 - 36.0 g/dL   RDW 84.8 88.4 - 84.4 %   Platelets 195 150 - 400 K/uL   nRBC 0.0 0.0 - 0.2 %  Basic metabolic panel     Status: Abnormal   Collection Time: 04/12/24  4:08 AM  Result Value Ref Range   Sodium 135 135 - 145 mmol/L   Potassium 3.2 (L) 3.5 - 5.1 mmol/L   Chloride 106 98 - 111 mmol/L   CO2 18 (L) 22 - 32 mmol/L   Glucose, Bld 160 (H) 70 - 99 mg/dL   BUN 17 8 - 23 mg/dL   Creatinine, Ser 9.52 0.44 - 1.00 mg/dL   Calcium 8.8 (L) 8.9 - 10.3 mg/dL   GFR, Estimated >39 >39 mL/min   Anion gap 11 5 - 15  Glucose, capillary     Status: Abnormal   Collection Time: 04/12/24  7:17 AM  Result Value Ref Range   Glucose-Capillary 149 (H) 70 - 99 mg/dL  Magnesium     Status: None   Collection Time: 04/12/24 10:44 AM  Result Value Ref Range   Magnesium 1.7 1.7 - 2.4 mg/dL  Glucose, capillary     Status: Abnormal   Collection Time: 04/12/24 11:06 AM  Result Value Ref Range   Glucose-Capillary 158 (H) 70 - 99 mg/dL    Assessment & Plan: Present on Admission:  SDH (subdural hematoma) (HCC)    LOS: 3 days   Additional comments:I reviewed the Ellison's new clinical lab test results. / 83 y/o F GLF R posterior scalp hematoma L subdural hematoma with 3mm midline shift - per Dr. Lanis, keppra, F/U CT H stable11/10, neuro status change 11/12>>  repeat CTH with increased left SDH with increased midline shift and early uncal herniation. Ellison  moved to ICU for monitoring, Dr. Lanis notified and Ellison underwent L temporo-parietal craniotomy 11/12. On nicardipine gtt for BP control. Neuro changes overnight. Repeat CTH done. NSGY has seen. Plan for LTEEG and Keppra.  Acute hypoxic ventilator dependent respiratory failure - extubated 11/11.  HTN/freq PVCs - ICH parameters, On nicardipine gtt for BP control. Added home med combo beta blocker hydrochlorothiazide) HLD DM - CBG okay. Continue to monitor. May need SSI R hand swelling - no fx on xray FEN - NPO, SLP eval, NS IVF  VTE - SCD's, chemical VTE held due to intracranial hemorrhage ID - none; TMAX 100.2, WBC  15.2, resp cultures ordered, UA 11/12 neg Foley- placed 11/10, continue, strict I/O. Good UOP. Dispo - ICU, neuro checks, therapy.  No family at bedside. Will update later today. Critical Care Total Time*: 40 Minutes  Dann Hummer, MD, MPH, FACS Trauma & General Surgery Use [email or web address] to contact on call provider  04/12/2024  *Care during the described time interval was provided by me. I have reviewed this Ellison's available data, including medical history, events of note, physical examination and test results as part of my evaluation.

## 2024-04-12 NOTE — Plan of Care (Signed)
  Problem: Education: Goal: Knowledge of General Education information will improve Description: Including pain rating scale, medication(s)/side effects and non-pharmacologic comfort measures Outcome: Progressing   Problem: Clinical Measurements: Goal: Ability to maintain clinical measurements within normal limits will improve Outcome: Progressing Goal: Cardiovascular complication will be avoided Outcome: Progressing   Problem: Coping: Goal: Level of anxiety will decrease Outcome: Progressing

## 2024-04-12 NOTE — Progress Notes (Signed)
 SLP Cancellation Note  Patient Details Name: Jeanne Ellison MRN: [id number] DOB: May 07, 1941   Cancelled treatment:        Attempted to see pt for ongoing cognitive linguistic intervention and readiness for clinical swallowing evaluation.  Spoke with RN, pt has had increasing confusion overnight.  Now s/p evacuation.  SLP will continue to follow for medical readiness for further assessment and treatment.   Anette FORBES Grippe, MA, CCC-SLP Acute Rehabilitation Services Office: [phone] 04/12/2024, 9:41 AM

## 2024-04-13 ENCOUNTER — Inpatient Hospital Stay (HOSPITAL_COMMUNITY)

## 2024-04-13 ENCOUNTER — Inpatient Hospital Stay: Payer: Self-pay

## 2024-04-13 DIAGNOSIS — R569 Unspecified convulsions: Secondary | ICD-10-CM

## 2024-04-13 LAB — BASIC METABOLIC PANEL WITH GFR
Anion gap: 10 (ref 5–15)
Anion gap: 10 (ref 5–15)
BUN: 18 mg/dL (ref 8–23)
BUN: 20 mg/dL (ref 8–23)
CO2: 22 mmol/L (ref 22–32)
CO2: 23 mmol/L (ref 22–32)
Calcium: 8.9 mg/dL (ref 8.9–10.3)
Calcium: 8.7 mg/dL — ABNORMAL LOW (ref 8.9–10.3)
Chloride: 110 mmol/L (ref 98–111)
Chloride: 107 mmol/L (ref 98–111)
Creatinine, Ser: 0.36 mg/dL — ABNORMAL LOW (ref 0.44–1.00)
Creatinine, Ser: 0.31 mg/dL — ABNORMAL LOW (ref 0.44–1.00)
GFR, Estimated: >60 mL/min (ref >60)
GFR, Estimated: >60 mL/min (ref >60)
Glucose, Bld: 162 mg/dL — ABNORMAL HIGH (ref 70–99)
Glucose, Bld: 150 mg/dL — ABNORMAL HIGH (ref 70–99)
Potassium: 3.7 mmol/L (ref 3.5–5.1)
Potassium: 3.5 mmol/L (ref 3.5–5.1)
Sodium: 142 mmol/L (ref 135–145)
Sodium: 140 mmol/L (ref 135–145)

## 2024-04-13 LAB — GLUCOSE, CAPILLARY
Glucose-Capillary: 150 mg/dL — ABNORMAL HIGH (ref 70–99)
Glucose-Capillary: 236 mg/dL — ABNORMAL HIGH (ref 70–99)
Glucose-Capillary: 174 mg/dL — ABNORMAL HIGH (ref 70–99)
Glucose-Capillary: 171 mg/dL — ABNORMAL HIGH (ref 70–99)
Glucose-Capillary: 148 mg/dL — ABNORMAL HIGH (ref 70–99)

## 2024-04-13 LAB — CBC
HCT: 24.4 % — ABNORMAL LOW (ref 36.0–46.0)
Hemoglobin: 8.2 g/dL — ABNORMAL LOW (ref 12.0–15.0)
MCH: 28.9 pg (ref 26.0–34.0)
MCHC: 33.6 g/dL (ref 30.0–36.0)
MCV: 85.9 fL (ref 80.0–100.0)
Platelets: 187 K/uL (ref 150–400)
RBC: 2.84 MIL/uL — ABNORMAL LOW (ref 3.87–5.11)
RDW: 15.4 % (ref 11.5–15.5)
WBC: 13.5 K/uL — ABNORMAL HIGH (ref 4.0–10.5)
nRBC: 0 % (ref 0.0–0.2)

## 2024-04-13 LAB — MAGNESIUM: Magnesium: 2.1 mg/dL (ref 1.7–2.4)

## 2024-04-13 LAB — PHOSPHORUS
Phosphorus: <1 mg/dL — CL (ref 2.5–4.6)
Phosphorus: 3.1 mg/dL (ref 2.5–4.6)

## 2024-04-13 LAB — HEMOGLOBIN A1C
Hgb A1c MFr Bld: 6 % — ABNORMAL HIGH (ref 4.8–5.6)
Mean Plasma Glucose: 125.5 mg/dL

## 2024-04-13 MED ORDER — POTASSIUM PHOSPHATES 15 MMOLE/5ML IV SOLN
30.0000 mmol | Freq: Once | INTRAVENOUS | Status: DC
  Filled 2024-04-13: qty 10

## 2024-04-13 MED ORDER — INSULIN ASPART 100 UNIT/ML IJ SOLN
0.0000 [IU] | INTRAMUSCULAR | Status: DC
  Administered 2024-04-13 – 2024-04-14 (×6): 2 [IU] via SUBCUTANEOUS
  Administered 2024-04-14: 3 [IU] via SUBCUTANEOUS
  Administered 2024-04-14 – 2024-04-15 (×4): 2 [IU] via SUBCUTANEOUS
  Administered 2024-04-15 – 2024-04-16 (×2): 3 [IU] via SUBCUTANEOUS
  Administered 2024-04-16: 2 [IU] via SUBCUTANEOUS
  Administered 2024-04-17: 3 [IU] via SUBCUTANEOUS
  Administered 2024-04-17 – 2024-04-18 (×4): 2 [IU] via SUBCUTANEOUS
  Administered 2024-04-18 (×2): 3 [IU] via SUBCUTANEOUS
  Administered 2024-04-18: 2 [IU] via SUBCUTANEOUS
  Administered 2024-04-19 (×2): 5 [IU] via SUBCUTANEOUS
  Administered 2024-04-19 (×2): 3 [IU] via SUBCUTANEOUS
  Administered 2024-04-19 – 2024-04-20 (×3): 2 [IU] via SUBCUTANEOUS
  Administered 2024-04-20 (×2): 3 [IU] via SUBCUTANEOUS
  Administered 2024-04-20: 2 [IU] via SUBCUTANEOUS
  Administered 2024-04-21 (×2): 5 [IU] via SUBCUTANEOUS
  Administered 2024-04-21: 2 [IU] via SUBCUTANEOUS
  Filled 2024-04-13 (×2): qty 3
  Filled 2024-04-13: qty 2
  Filled 2024-04-13: qty 3
  Filled 2024-04-13: qty 2
  Filled 2024-04-13: qty 3
  Filled 2024-04-13 (×3): qty 2
  Filled 2024-04-13 (×2): qty 5
  Filled 2024-04-13: qty 3
  Filled 2024-04-13 (×5): qty 2
  Filled 2024-04-13: qty 5
  Filled 2024-04-13: qty 2
  Filled 2024-04-13 (×3): qty 3
  Filled 2024-04-13 (×4): qty 2
  Filled 2024-04-13: qty 3
  Filled 2024-04-13: qty 5
  Filled 2024-04-13 (×4): qty 2

## 2024-04-13 MED ORDER — ORAL CARE MOUTH RINSE
15.0000 mL | OROMUCOSAL | Status: DC
  Administered 2024-04-13 – 2024-04-15 (×8): 15 mL via OROMUCOSAL

## 2024-04-13 MED ORDER — SODIUM CHLORIDE 0.9% FLUSH: 10.0000 mL | INTRAVENOUS | Status: DC | PRN

## 2024-04-13 MED ORDER — THIAMINE MONONITRATE 100 MG PO TABS
500.0000 mg | ORAL_TABLET | Freq: Three times a day (TID) | ORAL | Status: AC
  Administered 2024-04-13 – 2024-04-14 (×3): 500 mg
  Filled 2024-04-13 (×2): qty 5

## 2024-04-13 MED ORDER — POTASSIUM & SODIUM PHOSPHATES 280-160-250 MG PO PACK
2.0000 | PACK | ORAL | Status: AC
  Administered 2024-04-13 (×3): 2
  Filled 2024-04-13 (×5): qty 2

## 2024-04-13 MED ORDER — SODIUM CHLORIDE 0.9% FLUSH
10.0000 mL | Freq: Two times a day (BID) | INTRAVENOUS | Status: DC
  Administered 2024-04-13: 10 mL
  Administered 2024-04-13: 30 mL
  Administered 2024-04-14 – 2024-04-19 (×11): 10 mL
  Administered 2024-04-19: 30 mL
  Administered 2024-04-20 – 2024-04-22 (×3): 10 mL

## 2024-04-13 MED ORDER — MORPHINE SULFATE (PF) 2 MG/ML IV SOLN
1.0000 mg | INTRAVENOUS | Status: DC | PRN
  Administered 2024-04-13 (×2): 1 mg via INTRAVENOUS
  Filled 2024-04-13 (×2): qty 1

## 2024-04-13 MED ORDER — INSULIN ASPART 100 UNIT/ML IJ SOLN
0.0000 [IU] | INTRAMUSCULAR | Status: DC
  Administered 2024-04-13: 1 [IU] via SUBCUTANEOUS
  Administered 2024-04-13 (×2): 3 [IU] via SUBCUTANEOUS
  Filled 2024-04-13: qty 3
  Filled 2024-04-13: qty 1
  Filled 2024-04-13: qty 3

## 2024-04-13 MED ORDER — POTASSIUM PHOSPHATES 15 MMOLE/5ML IV SOLN
45.0000 mmol | Freq: Once | INTRAVENOUS | Status: AC
  Administered 2024-04-13: 45 mmol via INTRAVENOUS
  Filled 2024-04-13: qty 15

## 2024-04-13 MED ORDER — THIAMINE MONONITRATE 100 MG PO TABS
100.0000 mg | ORAL_TABLET | Freq: Every day | ORAL | Status: DC
  Administered 2024-04-14 – 2024-04-18 (×5): 100 mg
  Filled 2024-04-13 (×5): qty 1

## 2024-04-13 NOTE — Progress Notes (Signed)
 Patient ID: Jeanne Ellison, female   DOB: 10/06/1940, 83 y.o.   MRN: [id number] Follow up - Trauma Critical Care   Patient Details:    Jeanne Ellison is an 83 y.o. female.  Lines/tubes : Arterial Line 04/11/24 Right Radial (Active)  Site Assessment Clean, Dry, Intact 04/11/24 2030  Line Status Pulsatile blood flow 04/11/24 2030  Art Line Waveform Appropriate;Square wave test performed 04/11/24 2030  Art Line Interventions Zeroed and calibrated;Leveled;Flushed per protocol 04/11/24 2030  Color/Movement/Sensation Capillary refill less than 3 sec 04/11/24 2030  Dressing Type Transparent 04/11/24 2030  Dressing Status Clean, Dry, Intact 04/11/24 2030  Dressing Change Due 04/18/24 04/11/24 2030     Urethral Catheter Devin Latex 16 Fr. (Active)  Indication for Insertion or Continuance of Catheter Peri-operative use for selective surgical procedure - not to exceed 24 hours post-op 04/11/24 2030  Site Assessment Clean, Dry, Intact 04/11/24 2030  Catheter Maintenance Bag below level of bladder;Catheter secured;Drainage bag/tubing not touching floor;Insertion date on drainage bag;No dependent loops;Seal intact 04/11/24 2030  Collection Container Standard drainage bag 04/11/24 2030  Securement Method Adhesive securement device 04/11/24 2030  Urinary Catheter Interventions (if applicable) Unclamped 04/11/24 1926  Output (mL) 20 mL 04/12/24 1000    Microbiology/Sepsis markers: Results for orders placed or performed during the hospital encounter of 04/09/24  MRSA Next Gen by PCR, Nasal     Status: None   Collection Time: 04/09/24 12:21 PM   Specimen: Nasal Mucosa; Nasal Swab  Result Value Ref Range Status   MRSA by PCR Next Gen NOT DETECTED NOT DETECTED Final    Comment: (NOTE) The GeneXpert MRSA Assay (FDA approved for NASAL specimens only), is one component of a comprehensive MRSA colonization surveillance program. It is not intended to diagnose MRSA infection nor to guide or  monitor treatment for MRSA infections. Test performance is not FDA approved in patients less than 37 years old. Performed at [facility], [address], [place], [place] [address]     Anti-infectives:  Anti-infectives (From admission, onward)    Start     Dose/Rate Route Frequency Ordered Stop   04/11/24 1600  ceFAZolin (ANCEF) 2-4 GM/100ML-% IVPB       Note to Pharmacy: Atanacio Race: cabinet override      04/11/24 1600 04/12/24 0414      Consults: Treatment Team:  Md, Trauma, MD Lanis Pupa, MD    Studies:    Events: R peripheral IV infiltrated   Subjective:    Overnight Issues:  Tracking, answering some questions -- I said good morning, she said good morning. She is unable to tell me her name. She is not following commands. She is moving purposefully.   EEG negative   Objective:  Vital signs for last 24 hours: Temp:  [97.8 F (36.6 C)-100 F (37.8 C)] 97.8 F (36.6 C) (11/14 0400) Pulse Rate:  [66-108] 73 (11/14 0700) Resp:  [14-32] 16 (11/14 0700) BP: (97-167)/(46-127) 134/51 (11/14 0700) SpO2:  [88 %-98 %] 89 % (11/14 0700) Arterial Line BP: (116-192)/(43-84) 156/56 (11/14 0700) Weight:  [66.3 kg] 66.3 kg (11/14 0456)  Hemodynamic parameters for last 24 hours:    Intake/Output from previous day: 11/13 0701 - 11/14 0700 In: 1744.7 [I.V.:1053; NG/GT:631.7; IV Piggyback:60] Out: 1312 [Urine:1312]  Intake/Output this shift: No intake/output data recorded.  Vent settings for last 24 hours:    Physical Exam:  General: alert and no respiratory distress Neuro: CN grossly in tact. Speaking but not following commands. Moving all  extremities Resp: clear to auscultation bilaterally CVS: RRR freq ectomy GI: soft, NT Extremities: calves soft L crani wound with bandage in place and some sanguinous strikethrough. Results for orders placed or performed during the hospital encounter of 04/09/24 (from the past 24 hours)  Magnesium     Status:  None   Collection Time: 04/12/24 10:44 AM  Result Value Ref Range   Magnesium 1.7 1.7 - 2.4 mg/dL  Glucose, capillary     Status: Abnormal   Collection Time: 04/12/24 11:06 AM  Result Value Ref Range   Glucose-Capillary 158 (H) 70 - 99 mg/dL  Glucose, capillary     Status: Abnormal   Collection Time: 04/12/24  3:12 PM  Result Value Ref Range   Glucose-Capillary 175 (H) 70 - 99 mg/dL  Glucose, capillary     Status: Abnormal   Collection Time: 04/12/24  7:33 PM  Result Value Ref Range   Glucose-Capillary 202 (H) 70 - 99 mg/dL  Glucose, capillary     Status: Abnormal   Collection Time: 04/12/24 11:31 PM  Result Value Ref Range   Glucose-Capillary 217 (H) 70 - 99 mg/dL  Hemoglobin J8r     Status: Abnormal   Collection Time: 04/13/24 12:53 AM  Result Value Ref Range   Hgb A1c MFr Bld 6.0 (H) 4.8 - 5.6 %   Mean Plasma Glucose 125.5 mg/dL  Glucose, capillary     Status: Abnormal   Collection Time: 04/13/24  3:40 AM  Result Value Ref Range   Glucose-Capillary 150 (H) 70 - 99 mg/dL  CBC     Status: Abnormal   Collection Time: 04/13/24  4:27 AM  Result Value Ref Range   WBC 13.5 (H) 4.0 - 10.5 K/uL   RBC 2.84 (L) 3.87 - 5.11 MIL/uL   Hemoglobin 8.2 (L) 12.0 - 15.0 g/dL   HCT 75.5 (L) 63.9 - 53.9 %   MCV 85.9 80.0 - 100.0 fL   MCH 28.9 26.0 - 34.0 pg   MCHC 33.6 30.0 - 36.0 g/dL   RDW 84.5 88.4 - 84.4 %   Platelets 187 150 - 400 K/uL   nRBC 0.0 0.0 - 0.2 %  Basic metabolic panel     Status: Abnormal   Collection Time: 04/13/24  4:27 AM  Result Value Ref Range   Sodium 142 135 - 145 mmol/L   Potassium 3.7 3.5 - 5.1 mmol/L   Chloride 110 98 - 111 mmol/L   CO2 22 22 - 32 mmol/L   Glucose, Bld 162 (H) 70 - 99 mg/dL   BUN 18 8 - 23 mg/dL   Creatinine, Ser 9.63 (L) 0.44 - 1.00 mg/dL   Calcium 8.9 8.9 - 89.6 mg/dL   GFR, Estimated >39 >39 mL/min   Anion gap 10 5 - 15  Magnesium     Status: None   Collection Time: 04/13/24  4:27 AM  Result Value Ref Range   Magnesium 2.1 1.7 -  2.4 mg/dL  Phosphorus     Status: Abnormal   Collection Time: 04/13/24  4:27 AM  Result Value Ref Range   Phosphorus <1.0 (LL) 2.5 - 4.6 mg/dL  Glucose, capillary     Status: Abnormal   Collection Time: 04/13/24  8:03 AM  Result Value Ref Range   Glucose-Capillary 236 (H) 70 - 99 mg/dL    Assessment & Plan: Present on Admission:  SDH (subdural hematoma) (HCC)    LOS: 4 days   Additional comments:I reviewed the patient's new clinical lab test results. /  83 y/o F GLF R posterior scalp hematoma L subdural hematoma with 3mm midline shift - per Dr. Lanis, keppra, F/U CT H stable11/10, neuro status change 11/12>>  repeat CTH with increased left SDH with increased midline shift and early uncal herniation. Patient moved to ICU for monitoring, Dr. Lanis notified and patient underwent L temporo-parietal craniotomy 11/12. On nicardipine gtt for BP control. Neuro changes overnight. Repeat CTH done. NSGY has seen. Plan for LTEEG and Keppra. EEG has been normal - may D/C today per neuro/NS. Acute hypoxic ventilator dependent respiratory failure - extubated 11/11.  HTN/freq PVCs - ICH parameters, On nicardipine gtt for BP control. Added home med combo beta blocker hydrochlorothiazide per tube) HLD DM - SSI R hand swelling - no fx on xray FEN - NPO, TF per cortrak; place PICC today - lost peripheral access; phos < 1.0 replete per tube this AM and 45 mmol IV once PICC in place, increase thiamine to 500 q 8h. Na 142 from 135, trend. Repeat BMP this afternoon. VTE - SCD's, chemical VTE held due to intracranial hemorrhage ID - none; afebrile WBC 13, resp cultures ordered 11/12, UA 11/12 neg Foley- foley was removed, required I&O cath this AM (750 mL), repeat bladder scan pending; good UOP Dispo - ICU, neuro checks, therapy. Place PICC. Husband and grandson at bedside, updates provided. Critical Care Total Time*: 40 Minutes   Almarie Pringle, PA-C [facility] Please see Amion  for pager number during day hours 7:00am-4:30pm   Patient examined and I spoke with her husband and grandson. I discussed the possibility of progress towards inpatient rehab.  Dann Hummer, MD, MPH, FACS Please use [email or web address] to contact on call provider   04/13/2024  *Care during the described time interval was provided by me. I have reviewed this patient's available data, including medical history, events of note, physical examination and test results as part of my evaluation.

## 2024-04-13 NOTE — Progress Notes (Signed)
  Progress Note   Date: 04/13/2024  Patient Name: Jeanne Ellison        MRN#: [id number]  Clarification of diagnosis:  Moderate malnutrition

## 2024-04-13 NOTE — Evaluation (Signed)
 Occupational Therapy Evaluation Patient Details Name: Jeanne Ellison MRN: [id number] DOB: February 01, 1941 Today's Date: 04/13/2024   History of Present Illness   83 yo female presents to [facility] 11/10 s/p fall with AMS. Imaging shows 6 mm L SDH with 3 mm midline shift. ETT 11/10-11/11. 11/12 neuro status change, repeat CT revealed increased left SDH with increased midline shift and early uncal herniation. L temporo-parietal craniotomy 11/12. CT chest, abdomen, pelvis; and CT cervical spine are negative for acute injury. EEG negative 11/14. PMH DM.     Clinical Impressions Jeanne Ellison was evaluated s/p the above admission list. She is indep at baseline. Upon evaluation the pt was limited by impaired cognition with impulsivity, impaired expressive communication, weakness, unsteady balance and decreased activity tolerance . Overall she needed mod A +2 for most aspects of mobility with significant cues for safety. Due to the deficits listed below the pt also needs up to max A for LB ADLs and mod A for UB ADLs. Pt will benefit from continued acute OT services and intensive inpatient follow up therapy, >3 hours/day after discharge.       If plan is discharge home, recommend the following:   A lot of help with walking and/or transfers;A lot of help with bathing/dressing/bathroom;Assistance with cooking/housework;Direct supervision/assist for medications management;Direct supervision/assist for financial management;Assist for transportation;Help with stairs or ramp for entrance;Supervision due to cognitive status     Functional Status Assessment   Patient has had a recent decline in their functional status and demonstrates the ability to make significant improvements in function in a reasonable and predictable amount of time.     Equipment Recommendations   None recommended by OT (defer)     Recommendations for Other Services   Rehab consult     Precautions/Restrictions    Precautions Precautions: Fall Precaution/Restrictions Comments: SBP <150, A line, R IJ PICC Restrictions Weight Bearing Restrictions Per Provider Order: No     Mobility Bed Mobility Overal bed mobility: Needs Assistance Bed Mobility: Supine to Sit, Sit to Supine     Supine to sit: Mod assist, +2 for physical assistance, +2 for safety/equipment Sit to supine: Min assist, +2 for physical assistance, +2 for safety/equipment   General bed mobility comments: +2 necessary for safety and line management, pt is impulsive    Transfers Overall transfer level: Needs assistance Equipment used: 2 person hand held assist Transfers: Sit to/from Stand Sit to Stand: Mod assist, +2 physical assistance, +2 safety/equipment           General transfer comment: fluctuating assist needs based on impulsivity and balance      Balance Overall balance assessment: Needs assistance Sitting-balance support: No upper extremity supported, Feet supported Sitting balance-Leahy Scale: Poor Sitting balance - Comments: CGA-mod A   Standing balance support: Single extremity supported, During functional activity Standing balance-Leahy Scale: Poor                             ADL either performed or assessed with clinical judgement   ADL Overall ADL's : Needs assistance/impaired Eating/Feeding: Moderate assistance   Grooming: Maximal assistance   Upper Body Bathing: Maximal assistance   Lower Body Bathing: Maximal assistance;+2 for physical assistance;+2 for safety/equipment   Upper Body Dressing : Moderate assistance   Lower Body Dressing: Maximal assistance;+2 for physical assistance;+2 for safety/equipment   Toilet Transfer: Moderate assistance;+2 for physical assistance;+2 for safety/equipment Toilet Transfer Details (indicate cue type and reason): flucctuating assist needs, impaired  balance and safety awareness. abulated into the bathroom with +2 HHA Toileting- Clothing  Manipulation and Hygiene: Total assistance;Sit to/from stand       Functional mobility during ADLs: Moderate assistance;+2 for physical assistance;+2 for safety/equipment General ADL Comments: pt is vastly impaired by cognition, her physical strength seems to be adequate     Vision Baseline Vision/History: 1 Wears glasses Vision Assessment?: Vision impaired- to be further tested in functional context Additional Comments: unable to assess     Perception Perception: Not tested       Praxis Praxis: Not tested       Pertinent Vitals/Pain Pain Assessment Pain Assessment: Faces Faces Pain Scale: Hurts a little bit Pain Location: generalized discomfort Pain Descriptors / Indicators: Headache Pain Intervention(s): Limited activity within patient's tolerance, Monitored during session     Extremity/Trunk Assessment Upper Extremity Assessment Upper Extremity Assessment: RUE deficits/detail;Difficult to assess due to impaired cognition;LUE deficits/detail RUE Deficits / Details: using functionally for bed mobity, to stand and to manage grooming at the sink. unable to formally assess LUE Deficits / Details: using functionally for mobility, sitting balance and adjusting blanket. unable to formally assess   Lower Extremity Assessment Lower Extremity Assessment: Defer to PT evaluation   Cervical / Trunk Assessment Cervical / Trunk Assessment: Kyphotic   Communication Communication Communication: Impaired Factors Affecting Communication: Reduced clarity of speech;Difficulty expressing self   Cognition Arousal: Alert Behavior During Therapy: Restless, Impulsive Cognition: Cognition impaired, Difficult to assess             OT - Cognition Comments: difficult to assess due to impaired communication, pt followed some simple safety commands. she was impulsive (tyring to get to sink for water?)                 Following commands: Impaired Following commands impaired: Follows  one step commands with increased time     Cueing  General Comments   Cueing Techniques: Verbal cues;Gestural cues  VSS on 4L   Exercises     Shoulder Instructions      Home Living Family/patient expects to be discharged to:: Private residence Living Arrangements: Spouse/significant other Available Help at Discharge: Family Type of Home: House Home Access: Stairs to enter Secretary/administrator of Steps: 5 Entrance Stairs-Rails: Left;Right Home Layout: One level     Bathroom Shower/Tub: Producer, Television/film/video: Standard     Home Equipment: Agricultural Consultant (2 wheels);Cane - single point          Prior Functioning/Environment Prior Level of Function : Independent/Modified Independent                    OT Problem List: Decreased strength;Decreased range of motion;Decreased activity tolerance;Impaired balance (sitting and/or standing);Decreased cognition;Decreased safety awareness;Decreased coordination;Decreased knowledge of use of DME or AE;Decreased knowledge of precautions   OT Treatment/Interventions: Self-care/ADL training;Therapeutic exercise;Neuromuscular education;DME and/or AE instruction;Therapeutic activities;Patient/family education;Balance training      OT Goals(Current goals can be found in the care plan section)   Acute Rehab OT Goals Patient Stated Goal: to go to bathroom OT Goal Formulation: With patient Time For Goal Achievement: 04/27/24 Potential to Achieve Goals: Good ADL Goals Pt Will Perform Grooming: with set-up;sitting Pt Will Perform Upper Body Dressing: with set-up;sitting Pt Will Perform Lower Body Dressing: with min assist;sit to/from stand Pt Will Transfer to Toilet: with contact guard assist;ambulating Additional ADL Goal #1: Pt will follow simple 1 step commands 100% of attempts   OT Frequency:  Min 2X/week  Co-evaluation PT/OT/SLP Co-Evaluation/Treatment: Yes Reason for Co-Treatment: Complexity of the  patient's impairments (multi-system involvement);For patient/therapist safety;To address functional/ADL transfers PT goals addressed during session: Mobility/safety with mobility        AM-PAC OT 6 Clicks Daily Activity     Outcome Measure Help from another person eating meals?: A Lot Help from another person taking care of personal grooming?: A Lot Help from another person toileting, which includes using toliet, bedpan, or urinal?: A Lot Help from another person bathing (including washing, rinsing, drying)?: A Lot Help from another person to put on and taking off regular upper body clothing?: A Lot Help from another person to put on and taking off regular lower body clothing?: A Lot 6 Click Score: 12   End of Session Equipment Utilized During Treatment: Gait belt Nurse Communication: Mobility status  Activity Tolerance: Patient tolerated treatment well Patient left: in bed;with call bell/phone within reach;with bed alarm set;with family/visitor present  OT Visit Diagnosis: Unsteadiness on feet (R26.81);Other abnormalities of gait and mobility (R26.89);Muscle weakness (generalized) (M62.81);Cognitive communication deficit (R41.841) Symptoms and signs involving cognitive functions: Nontraumatic intracerebral hemorrhage                Time: 1350-1418 OT Time Calculation (min): 28 min Charges:  OT General Charges $OT Visit: 1 Visit OT Evaluation $OT Eval Moderate Complexity: 1 Mod  Lucie Kendall, OTR/L Acute Rehabilitation Services Office [phone] Secure Chat Communication Preferred   [name] 04/13/2024, 3:09 PM

## 2024-04-13 NOTE — Progress Notes (Signed)
 Assessed patient following report and the drainage tubing attached to the actual foley catheter was observed to be detached with the seal still intact. Patient very restless with legs and likely worked off the connection. Dr. Dasie tMD made aware and instructions to continue use of present foley were received + charge nurse Quintin RN made aware.

## 2024-04-13 NOTE — Progress Notes (Signed)
 Peripherally Inserted Central Catheter Placement  The IV Nurse has discussed with the patient and/or persons authorized to consent for the patient, the purpose of this procedure and the potential benefits and risks involved with this procedure.  The benefits include less needle sticks, lab draws from the catheter, and the patient may be discharged home with the catheter. Risks include, but not limited to, infection, bleeding, blood clot (thrombus formation), and puncture of an artery; nerve damage and irregular heartbeat and possibility to perform a PICC exchange if needed/ordered by physician.  Alternatives to this procedure were also discussed.  Bard Power PICC patient education guide, fact sheet on infection prevention and patient information card has been provided to patient /or left at bedside.    PICC Placement Documentation  PICC Double Lumen 04/13/24 Right Basilic 37 cm 0 cm (Active)  Indication for Insertion or Continuance of Line Prolonged intravenous therapies 04/13/24 1201  Exposed Catheter (cm) 0 cm 04/13/24 1201  Site Assessment Clean, Dry, Intact 04/13/24 1201  Lumen #1 Status Flushed;Blood return noted;Saline locked 04/13/24 1201  Lumen #2 Status Flushed;Blood return noted;Saline locked 04/13/24 1201  Dressing Type Transparent 04/13/24 1201  Dressing Status Antimicrobial disc/dressing in place 04/13/24 1201  Line Care Connections checked and tightened 04/13/24 1201  Line Adjustment (NICU/IV Team Only) No 04/13/24 1201  Dressing Intervention New dressing 04/13/24 1201  Dressing Change Due 04/20/24 04/13/24 1201       Jeanne Ellison 04/13/2024, 12:04 PM

## 2024-04-13 NOTE — Progress Notes (Signed)
 Nutrition Follow Up  DOCUMENTATION CODES:   Non-severe (moderate) malnutrition in context of chronic illness  INTERVENTION:   Once phosphorus repletion complete and phosphorus level improves, resume tube feeding via Cortrak tube:  Osmolite 1.5 at 25 ml/h and increase by 10 ml every 8 hours to goal rate of 45 ml/hr (1080 ml per day) Prosource TF20 60 ml daily Provides 1700 kcal, 87 gm protein, 820 ml free water daily  Continue to monitor magnesium and phosphorus daily x 4 occurrences, MD to replete as needed, as pt is at risk for refeeding syndrome given pt meets criteria for moderate malnutrition. Continue high dose thiamine 500 mg 3 doses then thiamine 100 mg daily   NUTRITION DIAGNOSIS:   Moderate Malnutrition related to chronic illness as evidenced by mild muscle depletion, energy intake < or equal to 75% for > or equal to 1 month, moderate muscle depletion. Remains applicable  GOAL:   Patient will meet greater than or equal to 90% of their needs Progressing, but TF currently paused  MONITOR:   TF tolerance, Labs  REASON FOR ASSESSMENT:   Rounds (cortrak)    ASSESSMENT:   Pt with PMH of DM, HLD, and HTN admitted after GLF with R posterior scalp hematoma, L SDH with 3 mm midline shift, R hand swelling.   11/10 - admission; intubated due to neuro status 11/11 - extubated  11/12 - Pt tx to ICU due to increasing shift from 3 mm to 11 mm; s/p cortrak placement with tip gastric 11/14 - TF paused, severe hypophosphatemia; IV repletion ordered, PICC placed, high dose thiamine started  Pt discussed during ICU rounds with MD and RN. Also discussed pt's labs results with Pharmacy. Recommend high dose thiamine given significant refeeding syndrome and continued encephalopathy. Pt's phosphorus < 1 and will require IV repletion, pt getting PICC placed then repletion will be administered. Will recheck phosphorus after administration then restart tube feeds. Discussed titration with RN.  High dose thiamine started today, will transition to thiamine 100mg  after 3 doses.    Medications reviewed and include:  Colace ProSource TF 20 BID SSI 0-9 units q4h Keppra MVI w/ minerals daily Protonix Phos Nak q4h Thiamine 500 mg q8h Cardene Potassium phosphate 45 mmol in D5%   Labs reviewed:  Potassium 3.7<--3.2 Magnesium 2.1 Phosphorus <1 (critically low) CBG 150-236 mg/dL J8r 6.0    Diet Order:   Diet Order             Diet NPO time specified  Diet effective now                   EDUCATION NEEDS:   Education needs have been addressed  Skin:  Skin Assessment: Reviewed RN Assessment  Last BM:  11/14 multiple type 7s  Height:   Ht Readings from Last 1 Encounters:  04/09/24 5' 5.5 (1.664 m)    Weight:   Wt Readings from Last 1 Encounters:  04/13/24 66.3 kg    BMI:  Body mass index is 23.95 kg/m.  Estimated Nutritional Needs:   Kcal:  1600-1800  Protein:  85-100 grams  Fluid:  >1.6 L/day    Josette Glance, MS, RDN, LDN Clinical Dietitian I Please reach out via secure chat

## 2024-04-13 NOTE — Progress Notes (Addendum)
 Call received for critical Phosphorus level < 1 Dr. Tanda tMD paged to be made aware, still awaiting call back.  Critical lab value passed along to day shift Programmer, Systems.

## 2024-04-13 NOTE — Evaluation (Signed)
 Physical Therapy Re-Evaluation Patient Details Name: Jeanne Ellison MRN: [id number] DOB: August 23, 1940 Today's Date: 04/13/2024  History of Present Illness  83 yo female presents to [facility] 11/10 s/p fall with AMS. Imaging shows 6 mm L SDH with 3 mm midline shift. ETT 11/10-11/11. 11/12 neuro status change, repeat CT revealed increased left SDH with increased midline shift and early uncal herniation. L temporo-parietal craniotomy 11/12. CT chest, abdomen, pelvis; and CT cervical spine are negative for acute injury. EEG negative 11/14. PMH DM.  Clinical Impression   Pt presents with debility, AMS with impulsivity, impaired communication, impaired gait, decreased activity tolerance. Pt to benefit from acute PT to address deficits. Pt ambulated short room distance to/from bathroom, requires mod +2 physical assist at this time and is very impulsive throughout session, suspect exacerbated by poor understanding of commands and AMS. Patient will benefit from intensive inpatient follow-up therapy, >3 hours/day. PT to progress mobility as tolerated, and will continue to follow acutely.          If plan is discharge home, recommend the following: A lot of help with walking and/or transfers;A lot of help with bathing/dressing/bathroom   Can travel by private vehicle        Equipment Recommendations None recommended by PT  Recommendations for Other Services       Functional Status Assessment Patient has had a recent decline in their functional status and demonstrates the ability to make significant improvements in function in a reasonable and predictable amount of time.     Precautions / Restrictions Precautions Precautions: Fall Precaution/Restrictions Comments: SBP <150, A line, RIJ PICC Restrictions Weight Bearing Restrictions Per Provider Order: No      Mobility  Bed Mobility Overal bed mobility: Needs Assistance Bed Mobility: Supine to Sit, Sit to Supine     Supine to sit: Mod assist,  +2 for physical assistance, +2 for safety/equipment Sit to supine: Min assist, +2 for physical assistance, +2 for safety/equipment   General bed mobility comments: +2 necessary for safety and line management, pt is impulsive    Transfers Overall transfer level: Needs assistance Equipment used: 2 person hand held assist Transfers: Sit to/from Stand Sit to Stand: Mod assist, +2 physical assistance, +2 safety/equipment           General transfer comment: fluctuating assist needs based on impulsivity and balance, rise and steady assist. stand x2 from EOB.    Ambulation/Gait Ambulation/Gait assistance: Min assist, +2 safety/equipment, +2 physical assistance Gait Distance (Feet): 15 Feet Assistive device: 2 person hand held assist Gait Pattern/deviations: Step-through pattern, Decreased stride length, Trunk flexed Gait velocity: decr     General Gait Details: assist to steady, manage lines/leads, guide pt trajectory as pt impulsively leaning L/R with little to no awareness  Stairs            Wheelchair Mobility     Tilt Bed    Modified Rankin (Stroke Patients Only)       Balance Overall balance assessment: Needs assistance Sitting-balance support: No upper extremity supported, Feet supported Sitting balance-Leahy Scale: Poor Sitting balance - Comments: CGA-mod A   Standing balance support: Single extremity supported, During functional activity Standing balance-Leahy Scale: Poor                               Pertinent Vitals/Pain Pain Assessment Pain Assessment: Faces Faces Pain Scale: Hurts a little bit Pain Location: generalized discomfort Pain Descriptors / Indicators: Grimacing, Guarding  Pain Intervention(s): Limited activity within patient's tolerance, Monitored during session, Repositioned    Home Living Family/patient expects to be discharged to:: Private residence Living Arrangements: Spouse/significant other Available Help at  Discharge: Family Type of Home: House Home Access: Stairs to enter Entrance Stairs-Rails: Lawyer of Steps: 5   Home Layout: One level Home Equipment: Agricultural Consultant (2 wheels);Cane - single point      Prior Function Prior Level of Function : Independent/Modified Independent                     Extremity/Trunk Assessment   Upper Extremity Assessment Upper Extremity Assessment: Defer to OT evaluation RUE Deficits / Details: using functionally for bed mobity, to stand and to manage grooming at the sink. unable to formally assess LUE Deficits / Details: using functionally for mobility, sitting balance and adjusting blanket. unable to formally assess    Lower Extremity Assessment Lower Extremity Assessment: Generalized weakness;Difficult to assess due to impaired cognition (no focal neurologic deficits observe,  butunable to be formally tested given impaired cognition and communication)    Cervical / Trunk Assessment Cervical / Trunk Assessment: Kyphotic  Communication   Communication Communication: Impaired Factors Affecting Communication: Reduced clarity of speech;Difficulty expressing self    Cognition Arousal: Alert Behavior During Therapy: Restless, Impulsive   PT - Cognitive impairments: Difficult to assess, Sequencing, Problem solving, Safety/Judgement                       PT - Cognition Comments: anticipate receptive and expressive difficulties, follows commands inconsistently and benefits from multimodal cuing. very impulsive Following commands: Impaired Following commands impaired: Follows one step commands with increased time, Follows one step commands inconsistently     Cueing Cueing Techniques: Verbal cues, Gestural cues     General Comments General comments (skin integrity, edema, etc.): vss 4LO2    Exercises     Assessment/Plan    PT Assessment Patient needs continued PT services  PT Problem List Decreased  strength;Decreased mobility;Decreased safety awareness;Decreased range of motion;Decreased activity tolerance;Decreased knowledge of use of DME;Pain;Decreased balance;Decreased knowledge of precautions       PT Treatment Interventions DME instruction;Therapeutic activities;Gait training;Therapeutic exercise;Patient/family education;Balance training;Stair training;Functional mobility training;Neuromuscular re-education    PT Goals (Current goals can be found in the Care Plan section)  Acute Rehab PT Goals PT Goal Formulation: With patient Time For Goal Achievement: 04/24/24 Potential to Achieve Goals: Good    Frequency Min 3X/week     Co-evaluation PT/OT/SLP Co-Evaluation/Treatment: Yes Reason for Co-Treatment: Complexity of the patient's impairments (multi-system involvement);For patient/therapist safety;To address functional/ADL transfers PT goals addressed during session: Mobility/safety with mobility;Balance         AM-PAC PT 6 Clicks Mobility  Outcome Measure Help needed turning from your back to your side while in a flat bed without using bedrails?: A Lot Help needed moving from lying on your back to sitting on the side of a flat bed without using bedrails?: A Lot Help needed moving to and from a bed to a chair (including a wheelchair)?: A Lot Help needed standing up from a chair using your arms (e.g., wheelchair or bedside chair)?: A Lot Help needed to walk in hospital room?: Total Help needed climbing 3-5 steps with a railing? : Total 6 Click Score: 10    End of Session Equipment Utilized During Treatment: Oxygen;Gait belt Activity Tolerance: Patient tolerated treatment well Patient left: with call bell/phone within reach;with family/visitor present;in bed;with bed alarm set;Other (  comment) (bilat mitts donned) Nurse Communication: Mobility status PT Visit Diagnosis: Other abnormalities of gait and mobility (R26.89);Muscle weakness (generalized) (M62.81)    Time:  8649-8581 PT Time Calculation (min) (ACUTE ONLY): 28 min   Charges:   PT Evaluation $PT Re-evaluation: 1 Re-eval   PT General Charges $$ ACUTE PT VISIT: 1 Visit         [name], PT DPT Acute Rehabilitation Services Secure Chat Preferred  Office [phone]   Carlyon Nolasco E Johna 04/13/2024, 4:40 PM

## 2024-04-13 NOTE — Procedures (Addendum)
 Patient Name: Jeanne Ellison  MRN: [id number]  Epilepsy Attending: Arlin MALVA Krebs  Referring Physician/Provider: Lanis Pupa, MD  Duration: 04/12/2024 1252 to 04/13/2024 0914  Patient history: 83 y.o. female POD#1 left crani for SDH, was more appropriate immediately postop, now much more confused. EEG to evaluate for seizure  Level of alertness: Awake, asleep  AEDs during EEG study: LEV  Technical aspects: This EEG study was done with scalp electrodes positioned according to the 10-20 International system of electrode placement. Electrical activity was reviewed with band pass filter of 1-70Hz , sensitivity of 7 uV/mm, display speed of 35mm/sec with a 60Hz  notched filter applied as appropriate. EEG data were recorded continuously and digitally stored.  Video monitoring was available and reviewed as appropriate.  Description: The posterior dominant rhythm consists of 7.5 Hz activity of moderate voltage (25-35 uV) seen predominantly in posterior head regions, asymmetric ( left<right) and reactive to eye opening and eye closing. Sleep was characterized by vertex waves, sleep spindles (12 to 14 Hz), maximal frontocentral region. There is continuous 3 to 5 Hz low amplitude theta-delta slowing in left hemisphere, maximal left frontal region. Sharply contoured waves were noted in left frontal region. Hyperventilation and photic stimulation were not performed.      ABNORMALITY - Continuous slow, left hemisphere and maximal left frontal   IMPRESSION: This study is suggestive of cortical dysfunction arising from left hemisphere, maximal left frontal region likely secondary to underlying structural abnormality. No seizures or epileptiform discharges were seen throughout the recording.   Zaley Talley O Birl Lobello

## 2024-04-13 NOTE — Evaluation (Signed)
 Clinical/Bedside Swallow Evaluation Patient Details  Name: Jeanne Ellison MRN: [id number] Date of Birth: 04/07/1941  Today's Date: 04/13/2024 Time: SLP Start Time (ACUTE ONLY): 1041 SLP Stop Time (ACUTE ONLY): 1101 SLP Time Calculation (min) (ACUTE ONLY): 20 min  Past Medical History:  Past Medical History:  Diagnosis Date   Diabetes mellitus without complication (HCC)    Type II   Hypertension    Past Surgical History:  Past Surgical History:  Procedure Laterality Date   ABDOMINAL HYSTERECTOMY     APPENDECTOMY     HPI:  Patient is an 83 y.o. female who presented as a level 1 trauma on 11/10. History was provided by EMS. Patient reportedly found down at home, last known normal was 8:04am. Per husband, she was found down at the foot of the bed, he was unsure if this was related to the vertigo that she had previous experiences with in 2016&2019. He said that she had talked about her balance not being as good. Per EMS, she was reportedly making sounds but not forming words, swinging all extremities in . She was agitated in the ED and was intubated for airway protection (11/10) and further workup. Patient was extubated on 11/11. Per husband, patient struggles keeping her bloodsugar levels in her perscribed range. Cognitive langauge evaluation ordered to assess patient's cognition. Patient was seen morning of 11/12, and was oriented only to set, different from her orientation X 4 yesterday. A stat repeat head CT was ordered and notable for increased SDH to 11mm with 8mm MLS, subfalcine and early uncal herniation. Moved patient to ICU for closer monitoring.    Assessment / Plan / Recommendation  Clinical Impression  Pt presents with clinical indicators of pharyngeal dysphagia in setting of SDH now s/p evacuation.  Pt with ETT just over 24 hours 11/10-11 and then again for procedure 11/12.  Pt more alert per nursing, but still with confusion.  Pt unable to follow directions for OME.  Minimal  verbal output, most outside of yes or okay was unintelligible, but vocal quality seemingly WFL.  SLP provided oral care prior to administration of PO trials.  Pt did not attempt to masticate ice chip.  Pt tolerated spoon amounts of thin liquid with no clinical s/s of aspiration.  When given straw pt took large serial sips which resulted in cough on one of 2 trials.  SLP pinched straw to limit bolus flow to single sips without clinical indicators or pharyngeal dysphagia.  Pt was able to clear puree from oral cavity on one of 2 trials.  On second trial, there was a large amount remaining on anterior dorsal surface of tongue.  A secondary swallow improved clearance, but SLP removed a small amount via suction.  Would recommend improved oral transit/attention to bolus prior to initiating diet or completing instrumental assessment.  Pt is nutritionally supported at this time.    Recommend pt remain NPO with alternate means of nutrition, hydration, and medication.  Pt may have ice chips and small amounts of water by spoon or straw, in moderation, after good oral care, when fully awake/alert, with upright positioning and 1:1 assistance.  If using straw pinch and remove from oral cavity to limit bolus flow to single sips.   SLP Visit Diagnosis: Dysphagia, unspecified (R13.10)    Aspiration Risk  Mild aspiration risk    Diet Recommendation NPO;Alternative means - temporary    Medication Administration: Via alternative means    Other  Recommendations Oral Care Recommendations: Oral care QID;Oral care  prior to ice chip/H20     Assistance Recommended at Discharge  N/A  Functional Status Assessment Patient has had a recent decline in their functional status and demonstrates the ability to make significant improvements in function in a reasonable and predictable amount of time.  Frequency and Duration min 2x/week  2 weeks       Prognosis Prognosis for improved oropharyngeal function: Good (with  continued recover)      Swallow Study   General Date of Onset: 04/09/24 HPI: Patient is an 83 y.o. female who presented as a level 1 trauma on 11/10. History was provided by EMS. Patient reportedly found down at home, last known normal was 8:04am. Per husband, she was found down at the foot of the bed, he was unsure if this was related to the vertigo that she had previous experiences with in 2016&2019. He said that she had talked about her balance not being as good. Per EMS, she was reportedly making sounds but not forming words, swinging all extremities in . She was agitated in the ED and was intubated for airway protection (11/10) and further workup. Patient was extubated on 11/11. Per husband, patient struggles keeping her bloodsugar levels in her perscribed range. Cognitive langauge evaluation ordered to assess patient's cognition. Patient was seen morning of 11/12, and was oriented only to set, different from her orientation X 4 yesterday. A stat repeat head CT was ordered and notable for increased SDH to 11mm with 8mm MLS, subfalcine and early uncal herniation. Moved patient to ICU for closer monitoring. Type of Study: Bedside Swallow Evaluation Previous Swallow Assessment: none Diet Prior to this Study: NPO;Cortrak/Small bore NG tube Temperature Spikes Noted: No History of Recent Intubation: Yes Total duration of intubation (days):  (just over 24 hours, and then for procedure 11/12) Date extubated: 04/10/24 Behavior/Cognition: Alert;Doesn't follow directions;Requires cueing Oral Cavity Assessment: Dry Oral Care Completed by SLP: Yes Oral Cavity - Dentition: Adequate natural dentition Self-Feeding Abilities: Needs assist Patient Positioning: Upright in bed Baseline Vocal Quality: Normal Volitional Cough: Cognitively unable to elicit Volitional Swallow: Unable to elicit    Oral/Motor/Sensory Function Overall Oral Motor/Sensory Function:  (Unable to assess)   Ice Chips Ice chips:  Impaired Oral Phase Impairments: Impaired mastication   Thin Liquid Thin Liquid: Impaired Presentation: Straw;Spoon Pharyngeal  Phase Impairments: Cough - Immediate;Multiple swallows    Nectar Thick Nectar Thick Liquid: Not tested   Honey Thick Honey Thick Liquid: Not tested   Puree Puree: Impaired Presentation: Spoon Oral Phase Impairments:  (Inattention) Oral Phase Functional Implications: Oral residue   Solid     Solid: Not tested      Anette FORBES Grippe, MA, CCC-SLP Acute Rehabilitation Services Office: [phone] 04/13/2024,11:27 AM

## 2024-04-13 NOTE — Progress Notes (Signed)
..  Trauma Event Note    Reason for Call :  While rounding primary RN [place] noted pts continued agitation, pulling off mits, removed IV, etc. Pt noted to only have Tramadol ordered for pain s/p crani and no Insulin coverage.  Dr. Tanda contacted, He will enter orders for meds.  0238-Primary RN reports pt had foley removed earlier in the day, bladder scan now reveals 593 in bladder. In/out cath order placed.   Last imported Vital Signs BP (!) 128/56   Pulse 77   Temp 97.8 F (36.6 C) (Axillary)   Resp 17   Ht 5' 5.5 (1.664 m)   Wt 146 lb 2.6 oz (66.3 kg) Comment: All items removed from bed and pt weighed  SpO2 92%   BMI 23.95 kg/m   Trending CBC Recent Labs    04/11/24 0343 04/12/24 0408 04/13/24 0427  WBC 12.7* 15.2* 13.5*  HGB 10.7* 9.4* 8.2*  HCT 31.7* 27.7* 24.4*  PLT 200 195 187    Trending Coag's No results for input(s): APTT, INR in the last 72 hours.  Trending BMET Recent Labs    04/10/24 0821 04/11/24 0343 04/12/24 0408  NA 136 137 135  K 3.4* 4.3 3.2*  CL 100 105 106  CO2 23 21* 18*  BUN 16 17 17   CREATININE 0.57 0.49 0.47  GLUCOSE 111* 158* 160*      Jeanne Ellison  Trauma Response RN  Please call TRN at [phone] for further assistance.

## 2024-04-13 NOTE — Progress Notes (Signed)
   Inpatient Rehabilitation Admissions Coordinator   I will place rehab consult for full assessment of candidacy for possible Cir admit.  Heron Leavell, RN, MSN Rehab Admissions Coordinator [phone] 04/13/2024 3:45 PM

## 2024-04-13 NOTE — Progress Notes (Signed)
  NEUROSURGERY PROGRESS NOTE   No issues overnight.  EXAM:  BP (!) 134/51   Pulse 73   Temp 97.8 F (36.6 C) (Axillary)   Resp 16   Ht 5' 5.5 (1.664 m)   Wt 66.3 kg Comment: All items removed from bed and pt weighed  SpO2 (!) 89%   BMI 23.95 kg/m   Awake, alert Tracking, but not following commands this am CN grossly intact Moves all ext purposefully and symmetric Left wound c/d/i  IMPRESSION:  83 y.o. female POD#2 left crani for SDH, was more appropriate immediately postop, now more obtunded. EEG has been negative.  PLAN: - Can d/c LTEEG. Cont Keppra - Cont supportive care per trauma,    Gerldine Maizes, MD [facility]

## 2024-04-13 NOTE — Progress Notes (Signed)
 SLP Cancellation Note  Patient Details Name: Jeanne Ellison MRN: [id number] DOB: [birth year]   Cancelled treatment:        Attempted to see pt for swallowing assessment and ongoing cognitive linguistic treatment. Spoke with RN.  Pt received sedative medication earlier this AM and is not appropriate at this time. Please reach out to ST by phone at [phone] or secure chat via [facility] SLP group if pt is appropriate for further assessment and treatment later in the day or for any needs over the weekend.   Anette FORBES Grippe, MA, CCC-SLP Acute Rehabilitation Services Office: [phone] 04/13/2024, 8:08 AM

## 2024-04-13 NOTE — Progress Notes (Signed)
 LTM VIDEO EEG discontinued - no skin breakdown at [facility].

## 2024-04-14 ENCOUNTER — Inpatient Hospital Stay (HOSPITAL_COMMUNITY)

## 2024-04-14 DIAGNOSIS — I609 Nontraumatic subarachnoid hemorrhage, unspecified: Secondary | ICD-10-CM | POA: Diagnosis not present

## 2024-04-14 DIAGNOSIS — G9389 Other specified disorders of brain: Secondary | ICD-10-CM | POA: Diagnosis not present

## 2024-04-14 DIAGNOSIS — I6200 Nontraumatic subdural hemorrhage, unspecified: Secondary | ICD-10-CM | POA: Diagnosis not present

## 2024-04-14 DIAGNOSIS — S0003XA Contusion of scalp, initial encounter: Secondary | ICD-10-CM | POA: Diagnosis not present

## 2024-04-14 LAB — POCT I-STAT 7, (LYTES, BLD GAS, ICA,H+H)
Acid-Base Excess: 0 mmol/L (ref 0.0–2.0)
Bicarbonate: 23.1 mmol/L (ref 20.0–28.0)
Calcium, Ion: 1.28 mmol/L (ref 1.15–1.40)
HCT: 23 % — ABNORMAL LOW (ref 36.0–46.0)
Hemoglobin: 7.8 g/dL — ABNORMAL LOW (ref 12.0–15.0)
O2 Saturation: 95 %
Patient temperature: 98.7
Potassium: 3.2 mmol/L — ABNORMAL LOW (ref 3.5–5.1)
Sodium: 143 mmol/L (ref 135–145)
TCO2: 24 mmol/L (ref 22–32)
pCO2 arterial: 29.6 mmHg — ABNORMAL LOW (ref 32–48)
pH, Arterial: 7.5 — ABNORMAL HIGH (ref 7.35–7.45)
pO2, Arterial: 70 mmHg — ABNORMAL LOW (ref 83–108)

## 2024-04-14 LAB — BASIC METABOLIC PANEL WITH GFR
Anion gap: 12 (ref 5–15)
BUN: 21 mg/dL (ref 8–23)
CO2: 22 mmol/L (ref 22–32)
Calcium: 8.6 mg/dL — ABNORMAL LOW (ref 8.9–10.3)
Chloride: 108 mmol/L (ref 98–111)
Creatinine, Ser: 0.39 mg/dL — ABNORMAL LOW (ref 0.44–1.00)
GFR, Estimated: >60 mL/min (ref >60)
Glucose, Bld: 118 mg/dL — ABNORMAL HIGH (ref 70–99)
Potassium: 3.5 mmol/L (ref 3.5–5.1)
Sodium: 142 mmol/L (ref 135–145)

## 2024-04-14 LAB — PHOSPHORUS: Phosphorus: 2.6 mg/dL (ref 2.5–4.6)

## 2024-04-14 LAB — CBC
HCT: 21.5 % — ABNORMAL LOW (ref 36.0–46.0)
Hemoglobin: 7.3 g/dL — ABNORMAL LOW (ref 12.0–15.0)
MCH: 29.2 pg (ref 26.0–34.0)
MCHC: 34 g/dL (ref 30.0–36.0)
MCV: 86 fL (ref 80.0–100.0)
Platelets: 174 K/uL (ref 150–400)
RBC: 2.5 MIL/uL — ABNORMAL LOW (ref 3.87–5.11)
RDW: 15.3 % (ref 11.5–15.5)
WBC: 11.9 K/uL — ABNORMAL HIGH (ref 4.0–10.5)
nRBC: 0 % (ref 0.0–0.2)

## 2024-04-14 LAB — GLUCOSE, CAPILLARY
Glucose-Capillary: 127 mg/dL — ABNORMAL HIGH (ref 70–99)
Glucose-Capillary: 132 mg/dL — ABNORMAL HIGH (ref 70–99)
Glucose-Capillary: 132 mg/dL — ABNORMAL HIGH (ref 70–99)
Glucose-Capillary: 133 mg/dL — ABNORMAL HIGH (ref 70–99)
Glucose-Capillary: 138 mg/dL — ABNORMAL HIGH (ref 70–99)
Glucose-Capillary: 141 mg/dL — ABNORMAL HIGH (ref 70–99)
Glucose-Capillary: 158 mg/dL — ABNORMAL HIGH (ref 70–99)

## 2024-04-14 LAB — MAGNESIUM: Magnesium: 1.9 mg/dL (ref 1.7–2.4)

## 2024-04-14 MED ORDER — K PHOS MONO-SOD PHOS DI & MONO 155-852-130 MG PO TABS
500.0000 mg | ORAL_TABLET | Freq: Once | ORAL | Status: DC
  Filled 2024-04-14: qty 2

## 2024-04-14 MED ORDER — FUROSEMIDE 10 MG/ML IJ SOLN
60.0000 mg | Freq: Once | INTRAMUSCULAR | Status: AC
  Administered 2024-04-14: 60 mg via INTRAVENOUS
  Filled 2024-04-14: qty 6

## 2024-04-14 MED ORDER — IOHEXOL 350 MG/ML SOLN
75.0000 mL | Freq: Once | INTRAVENOUS | Status: AC | PRN
  Administered 2024-04-14: 75 mL via INTRAVENOUS

## 2024-04-14 MED ORDER — POTASSIUM PHOSPHATES 15 MMOLE/5ML IV SOLN
30.0000 mmol | Freq: Once | INTRAVENOUS | Status: AC
  Administered 2024-04-14: 30 mmol via INTRAVENOUS
  Filled 2024-04-14: qty 10

## 2024-04-14 NOTE — Progress Notes (Signed)
 Patient awake and son asked for water to give to patient, patient orders are NPO, however speech told family patient could have water by spoon and ice chips, also a form above bed. Son requested water, Son giving patient water by spoon and patient coughing after every sip. Son adamant to give her water. Will pass along to dayshift. Patient has required increased O2 throughout the night.

## 2024-04-14 NOTE — Progress Notes (Signed)
 NEUROSURGERY PROGRESS NOTE  Patient doing a little better, she FC appropriately but is still aphasic. MAE. No new recommendations   Temp:  [98.2 F (36.8 C)-99.3 F (37.4 C)] 99 F (37.2 C) (11/15 0800) Pulse Rate:  [76-104] 91 (11/15 0900) Resp:  [17-29] 19 (11/15 0900) BP: (124-152)/(47-64) 140/56 (11/15 0900) SpO2:  [86 %-95 %] 92 % (11/15 0900) Arterial Line BP: (127-166)/(38-58) 145/42 (11/15 0900) Weight:  [68.7 kg] 68.7 kg (11/15 0500)   Suzen Chiquita Pean, NP 04/14/2024 10:15 AM

## 2024-04-14 NOTE — Plan of Care (Signed)
  Problem: Education: Goal: Knowledge of General Education information will improve Description: Including pain rating scale, medication(s)/side effects and non-pharmacologic comfort measures Outcome: Progressing   Problem: Health Behavior/Discharge Planning: Goal: Ability to manage health-related needs will improve Outcome: Progressing   Problem: Clinical Measurements: Goal: Ability to maintain clinical measurements within normal limits will improve Outcome: Progressing Goal: Will remain free from infection Outcome: Progressing Goal: Diagnostic test results will improve Outcome: Progressing Goal: Cardiovascular complication will be avoided Outcome: Progressing   Problem: Activity: Goal: Risk for activity intolerance will decrease Outcome: Progressing   Problem: Coping: Goal: Level of anxiety will decrease Outcome: Progressing   Problem: Elimination: Goal: Will not experience complications related to bowel motility Outcome: Progressing Goal: Will not experience complications related to urinary retention Outcome: Progressing   Problem: Pain Managment: Goal: General experience of comfort will improve and/or be controlled Outcome: Progressing   Problem: Safety: Goal: Ability to remain free from injury will improve Outcome: Progressing

## 2024-04-14 NOTE — PMR Pre-admission (Signed)
 PMR Admission Coordinator Pre-Admission Assessment  Patient: Jeanne Ellison is an 83 y.o., female MRN: [id number] DOB: 1941/05/07 Height: 5' 5.5 (166.4 cm) Weight: 65.4 kg  Insurance Information HMO:     PPO: yes     PCP:      IPA:      80/20:      OTHER:  PRIMARY: [employer]      Policy#: [id number]      Subscriber: patient CM Name: Marval      Phone#:       Fax#: [phone] Pre-Cert#: [id number] approved by Marval from 04/21/24 to 04/27/24     Employer: Not working Benefits:  Phone #: [phone]     Name:   Eff. Date:       Deduct:        Out of Pocket Max:        Life Max:   CIR:        SNF:   Outpatient:       Co-Pay:   Home Health:        Co-Pay:   DME:       Co-Pay:   Providers: in-network   SECONDARY:       Policy#:      Phone#:   Artist:       Phone#:   The Data Processing Manager" for patients in Inpatient Rehabilitation Facilities with attached "Privacy Act Statement-Health Care Records" was provided and verbally reviewed with: Family  Emergency Contact Information Contact Information     Name Relation Home Work Mobile   Stigall,Roy Spouse [phone]  [phone]      Other Contacts   None on File     Current Medical History  Patient Admitting Diagnosis: SDH  History of Present Illness: An 83 year old female with medical hx significant for: diabetes presented to [facility] on 04/09/24 d/t a fall with AMS. Pt intubated on 11/10. Noted right posterior scalp hematoma and abrasions on left shin. CT head revealed 6 mm left subdural hematoma with 3 mm midline shift. CT chest, abdomen and pelvis negative for acute injury. Neurosurgery consulted. Surgical intervention not recommended. Pt extubated on 11/11. Repeat CT stable. Neuro status changes noted on 11/12. STAT CT head showed increased left SDH to 11 mm with 8 mm increased midline shift and early uncal herniation. Cortrak placed on 11/12. Pt underwent left  temporo-parietal craniotomy for evacuation of SDH by Dr. Lanis on 04/11/24. EEG on 11/14 negative for seizures. Pt re-intubated on 11/16 d/t worsening hypoxia on NRB. Pt developed A-fib with RVR and transient hypotension. OG tube placed. EEG on 11/16 and 11/17 were negative for seizures. Pt extubated on 11/17. Therapy evaluations complete and CIR recommended d/t pt's deficits in functional mobility and aphasia.  Complete NIHSS TOTAL: 2  Patient's medical record from [facility] has been reviewed by the rehabilitation admission coordinator and physician.  Past Medical History  Past Medical History:  Diagnosis Date   Diabetes mellitus without complication (HCC)    Type II   Hypertension     Has the patient had major surgery during 100 days prior to admission? Yes  Family History   family history is not on file.  Current Medications  Current Facility-Administered Medications:    acetaminophen  (TYLENOL ) tablet 1,000 mg, 1,000 mg, Oral, Q6H, Fobbs, Rodericks T, RPH, 1,000 mg at 04/22/24 0444   amLODipine  (NORVASC ) tablet 10 mg, 10 mg, Oral, Daily, Paola Dreama SAILOR, MD, 10 mg at 04/22/24 [phone]  carvedilol  (COREG ) tablet 25 mg, 25 mg, Oral, BID WC, Lovick, Dreama SAILOR, MD, 25 mg at 04/22/24 9077   citalopram  (CELEXA ) tablet 20 mg, 20 mg, Oral, Daily, Fobbs, Rodericks T, RPH, 20 mg at 04/22/24 9077   clevidipine  (CLEVIPREX ) infusion 0.5 mg/mL, 0-21 mg/hr, Intravenous, Continuous, Claudene Toribio BROCKS, MD, Stopped at 04/19/24 1034   docusate (COLACE) 50 MG/5ML liquid 100 mg, 100 mg, Oral, BID, Fobbs, Rodericks T, RPH, 100 mg at 04/20/24 2056   enoxaparin  (LOVENOX ) injection 30 mg, 30 mg, Subcutaneous, Q12H, Sebastian Moles, MD, 30 mg at 04/22/24 0048   feeding supplement (GLUCERNA SHAKE) (GLUCERNA SHAKE) liquid 237 mL, 237 mL, Oral, TID BM, Lovick, Dreama SAILOR, MD, 237 mL at 04/21/24 2149   hydrALAZINE  (APRESOLINE ) tablet 50 mg, 50 mg, Oral, Q8H, Ann Fine, MD, 50 mg at 04/22/24 0447    insulin  aspart (novoLOG ) injection 0-15 Units, 0-15 Units, Subcutaneous, TID AC & HS, Simaan, Elizabeth S, PA-C, 5 Units at 04/22/24 9076   labetalol  (NORMODYNE ) injection 20 mg, 20 mg, Intravenous, Q2H PRN, Paliwal, Aditya, MD, 20 mg at 04/20/24 0850   levETIRAcetam  (KEPPRA ) tablet 500 mg, 500 mg, Oral, BID, Harold Scholz, MD, 500 mg at 04/22/24 9077   lip balm (CARMEX) ointment, , Topical, PRN, Sebastian Moles, MD, 1 Application at 04/17/24 1805   losartan  (COZAAR ) tablet 100 mg, 100 mg, Oral, Daily, Sebastian Moles, MD, 100 mg at 04/22/24 9077   meclizine  (ANTIVERT ) tablet 12.5 mg, 12.5 mg, Oral, TID PRN, Simaan, Elizabeth S, PA-C   melatonin tablet 3 mg, 3 mg, Oral, QHS, Simaan, Elizabeth S, PA-C   multivitamin with minerals tablet 1 tablet, 1 tablet, Oral, Daily, Fobbs, Rodericks T, RPH, 1 tablet at 04/22/24 9077   ondansetron  (ZOFRAN -ODT) disintegrating tablet 4 mg, 4 mg, Oral, Q6H PRN, 4 mg at 04/22/24 0922 **OR** ondansetron  (ZOFRAN ) injection 4 mg, 4 mg, Intravenous, Q6H PRN, Millen, Jessica B, RPH, 4 mg at 04/20/24 0907   Oral care mouth rinse, 15 mL, Mouth Rinse, PRN, Chand, Sudham, MD   polyethylene glycol (MIRALAX  / GLYCOLAX ) packet 17 g, 17 g, Oral, Daily PRN, Simaan, Elizabeth S, PA-C   psyllium (HYDROCIL/METAMUCIL) 1 packet, 1 packet, Oral, BID, Simaan, Elizabeth S, PA-C, 1 packet at 04/22/24 9077   rosuvastatin  (CRESTOR ) tablet 10 mg, 10 mg, Oral, QHS, Fobbs, Rodericks T, RPH, 10 mg at 04/21/24 2148   sodium chloride  flush (NS) 0.9 % injection 10-40 mL, 10-40 mL, Intracatheter, Q12H, Sebastian Moles, MD, 10 mL at 04/22/24 0929   sodium chloride  flush (NS) 0.9 % injection 10-40 mL, 10-40 mL, Intracatheter, PRN, Sebastian Moles, MD   [COMPLETED] thiamine  (VITAMIN B1) tablet 500 mg, 500 mg, Per Tube, Q8H, 500 mg at 04/14/24 0227 **FOLLOWED BY** thiamine  (VITAMIN B1) tablet 100 mg, 100 mg, Oral, Daily, Fobbs, Rodericks T, RPH, 100 mg at 04/22/24 9077  Patients Current Diet:  Diet Order              Diet regular Room service appropriate? Yes with Assist; Fluid consistency: Thin  Diet effective now                   Precautions / Restrictions Precautions Precautions: Fall Precaution/Restrictions Comments: SBP <150 Restrictions Weight Bearing Restrictions Per Provider Order: No   Has the patient had 2 or more falls or a fall with injury in the past year? Yes  Prior Activity Level Community (5-7x/wk): gets out of house daily  Prior Functional Level Self Care: Did the patient need help bathing, dressing,  using the toilet or eating? Independent  Indoor Mobility: Did the patient need assistance with walking from room to room (with or without device)? Independent  Stairs: Did the patient need assistance with internal or external stairs (with or without device)? Independent  Functional Cognition: Did the patient need help planning regular tasks such as shopping or remembering to take medications? Independent  Patient Information Are you of Hispanic, Latino/a,or Spanish origin?: X. Patient unable to respond, A. No, not of Hispanic, Latino/a, or Spanish origin What is your race?: X. Patient unable to respond, A. White Do you need or want an interpreter to communicate with a doctor or health care staff?: 9. Unable to respond Patient information obtained via proxy : son answered questions  Patient's Response To:  Health Literacy and Transportation Is the patient able to respond to health literacy and transportation needs?: No Health Literacy - How often do you need to have someone help you when you read instructions, pamphlets, or other written material from your doctor or pharmacy?: Patient unable to respond Health Literacy and Transportation obtained via proxy: son answered questions (pt has not missed any appointments d/t transportation issues)  Home Assistive Devices / Equipment Home Equipment: Agricultural Consultant (2 wheels), [employer] - single point  Prior Device Use:  Indicate devices/aids used by the patient prior to current illness, exacerbation or injury? cane  Current Functional Level Cognition  Arousal/Alertness: Awake/alert Overall Cognitive Status: Within Functional Limits for tasks assessed Orientation Level: Oriented X4 Attention: Selective, Alternating Focused Attention: Appears intact Selective Attention: Appears intact Alternating Attention: Appears intact Memory: Appears intact Awareness: Appears intact Problem Solving: Impaired Problem Solving Impairment: Verbal basic, Functional basic Executive Function: Reasoning Reasoning: Impaired Reasoning Impairment: Verbal basic, Functional basic    Extremity Assessment (includes Sensation/Coordination)  Upper Extremity Assessment: Defer to OT evaluation RUE Deficits / Details: overall WFL, globally 4/5. coordination WFL for grooming adn toileting LUE Deficits / Details: overall WFL, globally 4/5. coordination WFL for grooming adn toileting  Lower Extremity Assessment: Defer to PT evaluation    ADLs  Overall ADL's : Needs assistance/impaired Eating/Feeding: Moderate assistance Grooming: Contact guard assist, Standing, Wash/dry hands, Wash/dry face Upper Body Bathing: Maximal assistance Lower Body Bathing: Maximal assistance, +2 for physical assistance, +2 for safety/equipment Upper Body Dressing : Moderate assistance Lower Body Dressing: Maximal assistance, +2 for physical assistance, +2 for safety/equipment Toilet Transfer: Minimal assistance, Ambulation, Rolling walker (2 wheels), Regular Toilet Toilet Transfer Details (indicate cue type and reason): pivotal stepping only, pt complained of dizziness on standing Toileting- Clothing Manipulation and Hygiene: Contact guard assist, Sit to/from stand Toileting - Clothing Manipulation Details (indicate cue type and reason): continent bladder at the toilet Functional mobility during ADLs: Minimal assistance, Rolling walker (2 wheels) General  ADL Comments: limited by balance, cognition and activity tolerance    Mobility  Overal bed mobility: Needs Assistance Bed Mobility: Sit to Supine, Supine to Sit Rolling: Min assist Supine to sit: Min assist Sit to supine: Min assist General bed mobility comments: assist for trunk and LE management, pt able to boost self up in bed with HOB flat.    Transfers  Overall transfer level: Needs assistance Equipment used: Rolling walker (2 wheels) Transfers: Sit to/from Stand Sit to Stand: Min assist Bed to/from chair/wheelchair/BSC transfer type:: Step pivot Step pivot transfers: Mod assist, +2 physical assistance, +2 safety/equipment General transfer comment: slow to rise, light boost up assist    Ambulation / Gait / Stairs / Wheelchair Mobility  Ambulation/Gait Ambulation/Gait assistance: Min assist  Gait Distance (Feet): 125 Feet Assistive device: Rolling walker (2 wheels) Gait Pattern/deviations: Step-through pattern, Decreased stride length, Trunk flexed General Gait Details: assist to steady, cues for proximity to RW and upright posture Gait velocity: decr    Posture / Balance Dynamic Sitting Balance Sitting balance - Comments: CGA Balance Overall balance assessment: Needs assistance Sitting-balance support: No upper extremity supported, Feet supported Sitting balance-Leahy Scale: Fair Sitting balance - Comments: CGA Standing balance support: Single extremity supported, During functional activity Standing balance-Leahy Scale: Fair Standing balance comment: statically at the sink/toilet    Special considerations/life events  Continuous Drip IV  None, Oxygen None, Skin Surgical Incision: head; Abrasion: arm, leg/bilateral; Ecchymosis: leg/bilateral; Erythema/Redness: arm, leg/bilateral, Bowel and Bladder incontinence, Urethral Catheter, and Diabetic management Novolog  0-15 units every 4 hours   Previous Home Environment (from acute therapy documentation) Living Arrangements:  Spouse/significant other  Lives With: Spouse Available Help at Discharge: Family, Available 24 hours/day Type of Home: House Home Layout: One level Home Access: Stairs to enter Entrance Stairs-Rails: Left, Right Entrance Stairs-Number of Steps: 3 Bathroom Shower/Tub: Engineer, Manufacturing Systems: Standard Bathroom Accessibility: Yes How Accessible: Accessible via walker Home Care Services: No  Discharge Living Setting Plans for Discharge Living Setting: Patient's home Type of Home at Discharge: House Discharge Home Layout: One level Discharge Home Access: Stairs to enter Entrance Stairs-Rails: Right, Left Entrance Stairs-Number of Steps: 3 Discharge Bathroom Shower/Tub: Tub/shower unit Discharge Bathroom Toilet: Standard Discharge Bathroom Accessibility: Yes How Accessible: Accessible via walker Does the patient have any problems obtaining your medications?: No  Social/Family/Support Systems Anticipated Caregiver: Gaither Altes (husband) and family Anticipated Caregiver's Contact Information: Gaither: [phone], Christopher (son): [phone] Caregiver Availability: 24/7 Discharge Plan Discussed with Primary Caregiver: Yes Is Caregiver In Agreement with Plan?: Yes Does Caregiver/Family have Issues with Lodging/Transportation while Pt is in Rehab?: No  Goals Patient/Family Goal for Rehab: PT/OT/SLP supervision to mod I goals Expected length of stay: 10-12 days Pt/Family Agrees to Admission and willing to participate: Yes Program Orientation Provided & Reviewed with Pt/Caregiver Including Roles  & Responsibilities: Yes  Decrease burden of Care through IP rehab admission: NA  Possible need for SNF placement upon discharge: Not anticipated  Patient Condition: I have reviewed medical records from [facility], spoken with CM, and patient and family member. I met with patient at the bedside and discussed via phone for inpatient rehabilitation assessment.  Patient will  benefit from ongoing PT, OT, and SLP, can actively participate in 3 hours of therapy a day 5 days of the week, and can make measurable gains during the admission.  Patient will also benefit from the coordinated team approach during an Inpatient Acute Rehabilitation admission.  The patient will receive intensive therapy as well as Rehabilitation physician, nursing, social worker, and care management interventions.  Due to bladder management, bowel management, safety, skin/wound care, disease management, medication administration, pain management, and patient education the patient requires 24 hour a day rehabilitation nursing.  The patient is currently min to mod assist with mobility and basic ADLs.  Discharge setting and therapy post discharge at home with home health is anticipated.  Patient has agreed to participate in the Acute Inpatient Rehabilitation Program and will admit today.  Preadmission Screen Completed By:  Tinnie SHAUNNA Yvone Delayne, 04/22/2024 12:12 PM and updated by Lugene Ropes, RN on 04/22/24 at 12:10 pm. ______________________________________________________________________   Discussed status with Dr. Carilyn on 04/22/24 at 1200 and received approval for admission today.  Admission Coordinator:  Tinnie SHAUNNA Yvone  Delayne, CCC-SLP, time 1210/Date 04/22/24   Assessment/Plan: Diagnosis: Fall with left SDH Does the need for close, 24 hr/day Medical supervision in concert with the patient's rehab needs make it unreasonable for this patient to be served in a less intensive setting? Yes Co-Morbidities requiring supervision/potential complications: s/p left craniotomy for cerebral edema, Afib RVR Due to bladder management, bowel management, safety, skin/wound care, disease management, medication administration, pain management, and patient education, does the patient require 24 hr/day rehab nursing? Yes Does the patient require coordinated care of a physician, rehab nurse, PT, OT, and SLP to address  physical and functional deficits in the context of the above medical diagnosis(es)? Yes Addressing deficits in the following areas: balance, endurance, locomotion, strength, transferring, bowel/bladder control, bathing, dressing, toileting, and cognition Can the patient actively participate in an intensive therapy program of at least 3 hrs of therapy 5 days a week? Yes The potential for patient to make measurable gains while on inpatient rehab is good Anticipated functional outcomes upon discharge from inpatient rehab: modified independent and supervision PT, modified independent and supervision OT, modified independent and supervision SLP Estimated rehab length of stay to reach the above functional goals is: 10-12d Anticipated discharge destination: Home 10. Overall Rehab/Functional Prognosis: good   MD Signature: Prentice CHARLENA Compton M.D. [facility] Fellow Am Acad of Phys Med and Rehab Diplomate Am Board of Electrodiagnostic Med Fellow Am Board of Interventional Pain

## 2024-04-14 NOTE — Progress Notes (Signed)
 Patient ID: Jeanne Ellison, female   DOB: 1941/03/12, 83 y.o.   MRN: [id number] Follow up - Trauma Critical Care   Patient Details:    Jeanne Ellison is an 83 y.o. female.  Lines/tubes : Arterial Line 04/11/24 Right Radial (Active)  Site Assessment Clean, Dry, Intact 04/11/24 2030  Line Status Pulsatile blood flow 04/11/24 2030  Art Line Waveform Appropriate;Square wave test performed 04/11/24 2030  Art Line Interventions Zeroed and calibrated;Leveled;Flushed per protocol 04/11/24 2030  Color/Movement/Sensation Capillary refill less than 3 sec 04/11/24 2030  Dressing Type Transparent 04/11/24 2030  Dressing Status Clean, Dry, Intact 04/11/24 2030  Dressing Change Due 04/18/24 04/11/24 2030     Urethral Catheter Devin Latex 16 Fr. (Active)  Indication for Insertion or Continuance of Catheter Peri-operative use for selective surgical procedure - not to exceed 24 hours post-op 04/11/24 2030  Site Assessment Clean, Dry, Intact 04/11/24 2030  Catheter Maintenance Bag below level of bladder;Catheter secured;Drainage bag/tubing not touching floor;Insertion date on drainage bag;No dependent loops;Seal intact 04/11/24 2030  Collection Container Standard drainage bag 04/11/24 2030  Securement Method Adhesive securement device 04/11/24 2030  Urinary Catheter Interventions (if applicable) Unclamped 04/11/24 1926  Output (mL) 20 mL 04/12/24 1000    Microbiology/Sepsis markers: Results for orders placed or performed during the hospital encounter of 04/09/24  MRSA Next Gen by PCR, Nasal     Status: None   Collection Time: 04/09/24 12:21 PM   Specimen: Nasal Mucosa; Nasal Swab  Result Value Ref Range Status   MRSA by PCR Next Gen NOT DETECTED NOT DETECTED Final    Comment: (NOTE) The GeneXpert MRSA Assay (FDA approved for NASAL specimens only), is one component of a comprehensive MRSA colonization surveillance program. It is not intended to diagnose MRSA infection nor to guide or  monitor treatment for MRSA infections. Test performance is not FDA approved in patients less than 77 years old. Performed at [facility], [address], [place], [place] [address]     Anti-infectives:  Anti-infectives (From admission, onward)    Start     Dose/Rate Route Frequency Ordered Stop   04/11/24 1600  ceFAZolin (ANCEF) 2-4 GM/100ML-% IVPB       Note to Pharmacy: Atanacio Race: cabinet override      04/11/24 1600 04/12/24 0414      Consults: Treatment Team:  Md, Trauma, MD Lanis Pupa, MD    Studies:    Events: R peripheral IV infiltrated   Subjective:    Overnight Issues:  Tracking, answering some questions -- I said good morning, she said good morning. She is unable to tell me her name. She is not following commands. She is moving purposefully.   EEG negative   Objective:  Vital signs for last 24 hours: Temp:  [98.2 F (36.8 C)-99.3 F (37.4 C)] 99 F (37.2 C) (11/15 0800) Pulse Rate:  [76-104] 91 (11/15 0900) Resp:  [17-29] 19 (11/15 0900) BP: (124-152)/(47-64) 140/56 (11/15 0900) SpO2:  [86 %-95 %] 92 % (11/15 0900) Arterial Line BP: (127-166)/(38-58) 145/42 (11/15 0900) Weight:  [68.7 kg] 68.7 kg (11/15 0500)  Hemodynamic parameters for last 24 hours:    Intake/Output from previous day: 11/14 0701 - 11/15 0700 In: 2211.2 [I.V.:1550.8; NG/GT:135.2; IV Piggyback:525.3] Out: 1075 [Urine:1075]  Intake/Output this shift: Total I/O In: 223.9 [I.V.:223.9] Out: 105 [Urine:105]  Vent settings for last 24 hours:    Physical Exam:  General: alert and no respiratory distress Neuro: CN grossly in tact. Speaking but not  following commands. Moving all extremities Resp: clear to auscultation bilaterally CVS: RRR freq ectomy GI: soft, NT Extremities: calves soft L crani wound with bandage in place and some sanguinous strikethrough. Results for orders placed or performed during the hospital encounter of 04/09/24 (from the past 24  hours)  Glucose, capillary     Status: Abnormal   Collection Time: 04/13/24 10:58 AM  Result Value Ref Range   Glucose-Capillary 174 (H) 70 - 99 mg/dL  Glucose, capillary     Status: Abnormal   Collection Time: 04/13/24  4:07 PM  Result Value Ref Range   Glucose-Capillary 171 (H) 70 - 99 mg/dL  Glucose, capillary     Status: Abnormal   Collection Time: 04/13/24  7:45 PM  Result Value Ref Range   Glucose-Capillary 148 (H) 70 - 99 mg/dL  Basic metabolic panel     Status: Abnormal   Collection Time: 04/13/24  8:54 PM  Result Value Ref Range   Sodium 140 135 - 145 mmol/L   Potassium 3.5 3.5 - 5.1 mmol/L   Chloride 107 98 - 111 mmol/L   CO2 23 22 - 32 mmol/L   Glucose, Bld 150 (H) 70 - 99 mg/dL   BUN 20 8 - 23 mg/dL   Creatinine, Ser 9.68 (L) 0.44 - 1.00 mg/dL   Calcium 8.7 (L) 8.9 - 10.3 mg/dL   GFR, Estimated >39 >39 mL/min   Anion gap 10 5 - 15  Phosphorus     Status: None   Collection Time: 04/13/24  8:54 PM  Result Value Ref Range   Phosphorus 3.1 2.5 - 4.6 mg/dL  Glucose, capillary     Status: Abnormal   Collection Time: 04/14/24 12:21 AM  Result Value Ref Range   Glucose-Capillary 138 (H) 70 - 99 mg/dL  Magnesium     Status: None   Collection Time: 04/14/24  3:20 AM  Result Value Ref Range   Magnesium 1.9 1.7 - 2.4 mg/dL  Phosphorus     Status: None   Collection Time: 04/14/24  3:20 AM  Result Value Ref Range   Phosphorus 2.6 2.5 - 4.6 mg/dL  CBC     Status: Abnormal   Collection Time: 04/14/24  3:20 AM  Result Value Ref Range   WBC 11.9 (H) 4.0 - 10.5 K/uL   RBC 2.50 (L) 3.87 - 5.11 MIL/uL   Hemoglobin 7.3 (L) 12.0 - 15.0 g/dL   HCT 78.4 (L) 63.9 - 53.9 %   MCV 86.0 80.0 - 100.0 fL   MCH 29.2 26.0 - 34.0 pg   MCHC 34.0 30.0 - 36.0 g/dL   RDW 84.6 88.4 - 84.4 %   Platelets 174 150 - 400 K/uL   nRBC 0.0 0.0 - 0.2 %  Basic metabolic panel with GFR     Status: Abnormal   Collection Time: 04/14/24  3:20 AM  Result Value Ref Range   Sodium 142 135 - 145 mmol/L    Potassium 3.5 3.5 - 5.1 mmol/L   Chloride 108 98 - 111 mmol/L   CO2 22 22 - 32 mmol/L   Glucose, Bld 118 (H) 70 - 99 mg/dL   BUN 21 8 - 23 mg/dL   Creatinine, Ser 9.60 (L) 0.44 - 1.00 mg/dL   Calcium 8.6 (L) 8.9 - 10.3 mg/dL   GFR, Estimated >39 >39 mL/min   Anion gap 12 5 - 15  Glucose, capillary     Status: Abnormal   Collection Time: 04/14/24  4:31 AM  Result Value  Ref Range   Glucose-Capillary 132 (H) 70 - 99 mg/dL  Glucose, capillary     Status: Abnormal   Collection Time: 04/14/24  7:18 AM  Result Value Ref Range   Glucose-Capillary 132 (H) 70 - 99 mg/dL    Assessment & Plan: Present on Admission:  SDH (subdural hematoma) (HCC)    LOS: 5 days   Additional comments:I reviewed the patient's new clinical lab test results. / 83 y/o F GLF R posterior scalp hematoma L subdural hematoma with 3mm midline shift - per Dr. Lanis, keppra, F/U CT H stable11/10, neuro status change 11/12>>  repeat CTH with increased left SDH with increased midline shift and early uncal herniation. Patient moved to ICU for monitoring, Dr. Lanis notified and patient underwent L temporo-parietal craniotomy 11/12. On nicardipine gtt for BP control. Neuro changes overnight. Repeat CTH done. NSGY has seen. Plan for LTEEG and Keppra. EEG has been normal - may D/C today per neuro/NS. Acute hypoxic ventilator dependent respiratory failure - extubated 11/11.  HTN/freq PVCs - ICH parameters, On nicardipine gtt for BP control. Added home med combo beta blocker hydrochlorothiazide per tube) HLD DM - SSI R hand swelling - no fx on xray FEN - NPO, TF per cortrak; PICC 11/14 - lost peripheral access; kphos today as k+ 3.5 and phos trending back down, cont thiamine to 500 q 8h. Na 142 from 135, trend. BMP tomorrow VTE - SCD's, chemical VTE held due to intracranial hemorrhage ID - none; afebrile WBC 12, resp cultures ordered 11/12, UA 11/12 neg Foley- foley replaced 11/14 for evident retention Dispo - ICU,  neuro checks, therapy. Place PICC. No family present at bedside to update as of yet, primary RN however updated on plans of care in event family comes in later today.  Critical Care Total Time*: 32 Minutes   Lonni Pizza, MD [facility]   Patient examined and I spoke with her husband and grandson. I discussed the possibility of progress towards inpatient rehab.  Dann Hummer, MD, MPH, FACS Please use [email or web address] to contact on call provider   04/14/2024  *Care during the described time interval was provided by me. I have reviewed this patient's available data, including medical history, events of note, physical examination and test results as part of my evaluation.

## 2024-04-14 NOTE — Progress Notes (Signed)
 Pharmacy Electrolyte Replacement  Recent Labs:  Recent Labs    04/14/24 0320 04/14/24 1133  K 3.5 3.2*  MG 1.9  --   PHOS 2.6  --   CREATININE 0.39*  --     Low Critical Values (K </= 2.5, Phos </= 1, Mg </= 1) Present: None  MD Contacted: n/a  Repleted with Kphos 45 mmol IV x1 11/14, phos 3.1 > 2.6 today. K decreased 3.5 > 3.2.   Plan:  Give Kphos 30 mmol IV x1 per protocol Recheck phos, potassium with AM labs  Thank you for allowing pharmacy to be a part of this patient's care.  Shelba Collier, PharmD, BCPS Clinical Pharmacist

## 2024-04-14 NOTE — Progress Notes (Signed)
 Patient had Seizure like activity on the right side of face last approx 30-45 sec. Trauma MD called and is aware.

## 2024-04-14 NOTE — Progress Notes (Signed)
 RN notified in shift report that patient required more oxygen with PM nurse. At shift change, patient satting low 90s on 5L [place]. Around 1000, patient oxygen increased to 7L on green high flow nasal cannula. Around 1030, patient increased oxygen needs to 11L liters and respiratory called. MD Lonni Pizza paged at 1040, response around 1100 - get ABG, stat CTA chest to rule out PE, and continue to hold tube feeds. Patient placed on nonrebreather for trip and to lay flat.  Pt able to be weaned down from nonrebreather, but remains on high flow.  Pt able to express some concerns today such as feeling hot or cold. Most questions are not answered verbally or always responds with a head nod. Patient follows commands physically but does not answer questions.  At 1629, patient had right facial twitching along with right hand twitching that lasted about a minute. MD White paged and neurosurgery paged. SBP increased to 200s. Patient had dysarthria before seizure like activity and remains having dysrarthia but is now more talkative. Patient still follows commands physically and does not appear to have any deficits compared to earlier assessments.  1650 stat CT ordered by NP Meyran. 1710 CT notified RN that scan would have to wait another hour due to patient getting contrast this AM with scan. NP Meyran notified and stated waiting was okay.

## 2024-04-14 NOTE — Progress Notes (Signed)
 Inpatient Rehab Admissions:  Inpatient Rehab Consult received.  I met with patient and her son Jeanne Ellison at the bedside for rehabilitation assessment and to discuss goals and expectations of an inpatient rehab admission.  Discussed average length of stay, insurance authorization requirement and discharge home after completion of CIR. Jeanne Ellison acknowledged understanding and is supportive of pt pursuing CIR. Jeanne Ellison confirmed that family will be able ot provide 24/7 support for pt after discharge. Jeanne Ellison gave permission to contact his dad Jeanne Ellison. Called Jeanne Ellison and left a message; awaiting return call. Will continue to follow.  Signed: Tinnie Yvone Cohens, MS, CCC-SLP Admissions Coordinator [phone]

## 2024-04-15 ENCOUNTER — Inpatient Hospital Stay (HOSPITAL_COMMUNITY): Admitting: Anesthesiology

## 2024-04-15 ENCOUNTER — Inpatient Hospital Stay (HOSPITAL_COMMUNITY)

## 2024-04-15 DIAGNOSIS — I4891 Unspecified atrial fibrillation: Secondary | ICD-10-CM | POA: Diagnosis not present

## 2024-04-15 DIAGNOSIS — J811 Chronic pulmonary edema: Secondary | ICD-10-CM | POA: Diagnosis not present

## 2024-04-15 DIAGNOSIS — R569 Unspecified convulsions: Secondary | ICD-10-CM | POA: Diagnosis not present

## 2024-04-15 DIAGNOSIS — J9601 Acute respiratory failure with hypoxia: Secondary | ICD-10-CM

## 2024-04-15 DIAGNOSIS — R0989 Other specified symptoms and signs involving the circulatory and respiratory systems: Secondary | ICD-10-CM | POA: Diagnosis not present

## 2024-04-15 DIAGNOSIS — I517 Cardiomegaly: Secondary | ICD-10-CM | POA: Diagnosis not present

## 2024-04-15 DIAGNOSIS — R918 Other nonspecific abnormal finding of lung field: Secondary | ICD-10-CM | POA: Diagnosis not present

## 2024-04-15 DIAGNOSIS — F419 Anxiety disorder, unspecified: Secondary | ICD-10-CM

## 2024-04-15 DIAGNOSIS — I959 Hypotension, unspecified: Secondary | ICD-10-CM

## 2024-04-15 DIAGNOSIS — D72829 Elevated white blood cell count, unspecified: Secondary | ICD-10-CM

## 2024-04-15 DIAGNOSIS — E876 Hypokalemia: Secondary | ICD-10-CM

## 2024-04-15 DIAGNOSIS — S065XAA Traumatic subdural hemorrhage with loss of consciousness status unknown, initial encounter: Secondary | ICD-10-CM | POA: Diagnosis not present

## 2024-04-15 DIAGNOSIS — E785 Hyperlipidemia, unspecified: Secondary | ICD-10-CM

## 2024-04-15 DIAGNOSIS — J810 Acute pulmonary edema: Secondary | ICD-10-CM | POA: Diagnosis not present

## 2024-04-15 DIAGNOSIS — I10 Essential (primary) hypertension: Secondary | ICD-10-CM

## 2024-04-15 DIAGNOSIS — E119 Type 2 diabetes mellitus without complications: Secondary | ICD-10-CM

## 2024-04-15 DIAGNOSIS — R0602 Shortness of breath: Secondary | ICD-10-CM | POA: Diagnosis not present

## 2024-04-15 DIAGNOSIS — J90 Pleural effusion, not elsewhere classified: Secondary | ICD-10-CM | POA: Diagnosis not present

## 2024-04-15 DIAGNOSIS — D649 Anemia, unspecified: Secondary | ICD-10-CM

## 2024-04-15 LAB — BASIC METABOLIC PANEL WITH GFR
Anion gap: 10 (ref 5–15)
Anion gap: 12 (ref 5–15)
Anion gap: 9 (ref 5–15)
BUN: 20 mg/dL (ref 8–23)
BUN: 20 mg/dL (ref 8–23)
BUN: 24 mg/dL — ABNORMAL HIGH (ref 8–23)
CO2: 22 mmol/L (ref 22–32)
CO2: 24 mmol/L (ref 22–32)
CO2: 24 mmol/L (ref 22–32)
Calcium: 8.2 mg/dL — ABNORMAL LOW (ref 8.9–10.3)
Calcium: 8.3 mg/dL — ABNORMAL LOW (ref 8.9–10.3)
Calcium: 8.6 mg/dL — ABNORMAL LOW (ref 8.9–10.3)
Chloride: 107 mmol/L (ref 98–111)
Chloride: 107 mmol/L (ref 98–111)
Chloride: 110 mmol/L (ref 98–111)
Creatinine, Ser: 0.4 mg/dL — ABNORMAL LOW (ref 0.44–1.00)
Creatinine, Ser: 0.42 mg/dL — ABNORMAL LOW (ref 0.44–1.00)
Creatinine, Ser: 0.46 mg/dL (ref 0.44–1.00)
GFR, Estimated: >60 mL/min (ref >60)
GFR, Estimated: >60 mL/min (ref >60)
GFR, Estimated: >60 mL/min (ref >60)
Glucose, Bld: 102 mg/dL — ABNORMAL HIGH (ref 70–99)
Glucose, Bld: 148 mg/dL — ABNORMAL HIGH (ref 70–99)
Glucose, Bld: 150 mg/dL — ABNORMAL HIGH (ref 70–99)
Potassium: 3 mmol/L — ABNORMAL LOW (ref 3.5–5.1)
Potassium: 3.3 mmol/L — ABNORMAL LOW (ref 3.5–5.1)
Potassium: 3.5 mmol/L (ref 3.5–5.1)
Sodium: 140 mmol/L (ref 135–145)
Sodium: 141 mmol/L (ref 135–145)
Sodium: 144 mmol/L (ref 135–145)

## 2024-04-15 LAB — POCT I-STAT 7, (LYTES, BLD GAS, ICA,H+H)
Acid-Base Excess: 1 mmol/L (ref 0.0–2.0)
Bicarbonate: 24.5 mmol/L (ref 20.0–28.0)
Calcium, Ion: 1.18 mmol/L (ref 1.15–1.40)
HCT: 24 % — ABNORMAL LOW (ref 36.0–46.0)
Hemoglobin: 8.2 g/dL — ABNORMAL LOW (ref 12.0–15.0)
O2 Saturation: 100 %
Patient temperature: 97.3
Potassium: 3.7 mmol/L (ref 3.5–5.1)
Sodium: 143 mmol/L (ref 135–145)
TCO2: 25 mmol/L (ref 22–32)
pCO2 arterial: 32.6 mmHg (ref 32–48)
pH, Arterial: 7.481 — ABNORMAL HIGH (ref 7.35–7.45)
pO2, Arterial: 189 mmHg — ABNORMAL HIGH (ref 83–108)

## 2024-04-15 LAB — CBC
HCT: 23.5 % — ABNORMAL LOW (ref 36.0–46.0)
HCT: 24.2 % — ABNORMAL LOW (ref 36.0–46.0)
Hemoglobin: 7.8 g/dL — ABNORMAL LOW (ref 12.0–15.0)
Hemoglobin: 8.2 g/dL — ABNORMAL LOW (ref 12.0–15.0)
MCH: 28.9 pg (ref 26.0–34.0)
MCH: 29 pg (ref 26.0–34.0)
MCHC: 33.2 g/dL (ref 30.0–36.0)
MCHC: 33.9 g/dL (ref 30.0–36.0)
MCV: 85.5 fL (ref 80.0–100.0)
MCV: 87 fL (ref 80.0–100.0)
Platelets: 214 K/uL (ref 150–400)
Platelets: 228 K/uL (ref 150–400)
RBC: 2.7 MIL/uL — ABNORMAL LOW (ref 3.87–5.11)
RBC: 2.83 MIL/uL — ABNORMAL LOW (ref 3.87–5.11)
RDW: 15.4 % (ref 11.5–15.5)
RDW: 15.5 % (ref 11.5–15.5)
WBC: 10.4 K/uL (ref 4.0–10.5)
WBC: 11.7 K/uL — ABNORMAL HIGH (ref 4.0–10.5)
nRBC: 0.2 % (ref 0.0–0.2)
nRBC: 0.3 % — ABNORMAL HIGH (ref 0.0–0.2)

## 2024-04-15 LAB — GLUCOSE, CAPILLARY
Glucose-Capillary: 106 mg/dL — ABNORMAL HIGH (ref 70–99)
Glucose-Capillary: 115 mg/dL — ABNORMAL HIGH (ref 70–99)
Glucose-Capillary: 116 mg/dL — ABNORMAL HIGH (ref 70–99)
Glucose-Capillary: 122 mg/dL — ABNORMAL HIGH (ref 70–99)
Glucose-Capillary: 145 mg/dL — ABNORMAL HIGH (ref 70–99)
Glucose-Capillary: 157 mg/dL — ABNORMAL HIGH (ref 70–99)

## 2024-04-15 LAB — PHOSPHORUS: Phosphorus: 2.9 mg/dL (ref 2.5–4.6)

## 2024-04-15 LAB — MAGNESIUM
Magnesium: 2 mg/dL (ref 1.7–2.4)
Magnesium: 2.1 mg/dL (ref 1.7–2.4)

## 2024-04-15 MED ORDER — METOPROLOL TARTRATE 5 MG/5ML IV SOLN: 5.0000 mg | Freq: Once | INTRAVENOUS | Status: AC

## 2024-04-15 MED ORDER — PROPOFOL 10 MG/ML IV BOLUS
INTRAVENOUS | Status: DC | PRN
  Administered 2024-04-15: 80 mg via INTRAVENOUS

## 2024-04-15 MED ORDER — PROPOFOL 1000 MG/100ML IV EMUL
INTRAVENOUS | Status: AC
  Filled 2024-04-15: qty 100

## 2024-04-15 MED ORDER — AMIODARONE LOAD VIA INFUSION
150.0000 mg | Freq: Once | INTRAVENOUS | Status: AC
  Administered 2024-04-15: 150 mg via INTRAVENOUS
  Filled 2024-04-15: qty 83.34

## 2024-04-15 MED ORDER — NOREPINEPHRINE 4 MG/250ML-% IV SOLN
0.0000 ug/min | INTRAVENOUS | Status: DC
  Administered 2024-04-15: 5 ug/min via INTRAVENOUS

## 2024-04-15 MED ORDER — LIDOCAINE HCL (CARDIAC) PF 100 MG/5ML IV SOSY
PREFILLED_SYRINGE | INTRAVENOUS | Status: DC | PRN
  Administered 2024-04-15: 40 mg via INTRAVENOUS

## 2024-04-15 MED ORDER — CLEVIDIPINE BUTYRATE 0.5 MG/ML IV EMUL
INTRAVENOUS | Status: AC
  Administered 2024-04-15: 2 mg/h via INTRAVENOUS
  Filled 2024-04-15: qty 50

## 2024-04-15 MED ORDER — METOPROLOL TARTRATE 5 MG/5ML IV SOLN
INTRAVENOUS | Status: AC
  Administered 2024-04-15: 5 mg via INTRAVENOUS
  Filled 2024-04-15: qty 5

## 2024-04-15 MED ORDER — FUROSEMIDE 10 MG/ML IJ SOLN
60.0000 mg | Freq: Once | INTRAMUSCULAR | Status: AC
  Administered 2024-04-15: 60 mg via INTRAVENOUS

## 2024-04-15 MED ORDER — AMIODARONE HCL IN DEXTROSE 360-4.14 MG/200ML-% IV SOLN
INTRAVENOUS | Status: AC
  Filled 2024-04-15: qty 200

## 2024-04-15 MED ORDER — AMIODARONE HCL IN DEXTROSE 360-4.14 MG/200ML-% IV SOLN
60.0000 mg/h | INTRAVENOUS | Status: AC
  Administered 2024-04-15 (×2): 60 mg/h via INTRAVENOUS
  Filled 2024-04-15: qty 200

## 2024-04-15 MED ORDER — POTASSIUM CHLORIDE 20 MEQ PO PACK
40.0000 meq | PACK | Freq: Once | ORAL | Status: AC
  Administered 2024-04-15: 40 meq
  Filled 2024-04-15: qty 2

## 2024-04-15 MED ORDER — PROPOFOL 1000 MG/100ML IV EMUL
0.0000 ug/kg/min | INTRAVENOUS | Status: DC
  Administered 2024-04-15: 45 ug/kg/min via INTRAVENOUS
  Administered 2024-04-15: 25 ug/kg/min via INTRAVENOUS
  Administered 2024-04-15: 45 ug/kg/min via INTRAVENOUS
  Administered 2024-04-15: 40 ug/kg/min via INTRAVENOUS
  Administered 2024-04-16 (×2): 45 ug/kg/min via INTRAVENOUS
  Filled 2024-04-15 (×6): qty 100

## 2024-04-15 MED ORDER — FUROSEMIDE 10 MG/ML IJ SOLN
INTRAMUSCULAR | Status: AC
  Filled 2024-04-15: qty 8

## 2024-04-15 MED ORDER — CLEVIDIPINE BUTYRATE 0.5 MG/ML IV EMUL
0.0000 mg/h | INTRAVENOUS | Status: DC
  Administered 2024-04-15: 8 mg/h via INTRAVENOUS
  Administered 2024-04-16: 16 mg/h via INTRAVENOUS
  Administered 2024-04-16: 15 mg/h via INTRAVENOUS
  Administered 2024-04-16: 14 mg/h via INTRAVENOUS
  Administered 2024-04-16: 2 mg/h via INTRAVENOUS
  Administered 2024-04-16: 20 mg/h via INTRAVENOUS
  Administered 2024-04-16: 18 mg/h via INTRAVENOUS
  Administered 2024-04-16: 14 mg/h via INTRAVENOUS
  Administered 2024-04-16: 18 mg/h via INTRAVENOUS
  Administered 2024-04-16: 20 mg/h via INTRAVENOUS
  Administered 2024-04-16: 14 mg/h via INTRAVENOUS
  Administered 2024-04-17: 10 mg/h via INTRAVENOUS
  Administered 2024-04-17: 6 mg/h via INTRAVENOUS
  Administered 2024-04-17: 4 mg/h via INTRAVENOUS
  Administered 2024-04-18: 14 mg/h via INTRAVENOUS
  Administered 2024-04-18 (×2): 16 mg/h via INTRAVENOUS
  Administered 2024-04-18 (×2): 6 mg/h via INTRAVENOUS
  Filled 2024-04-15 (×2): qty 100
  Filled 2024-04-15 (×2): qty 50
  Filled 2024-04-15 (×3): qty 100
  Filled 2024-04-15: qty 50
  Filled 2024-04-15: qty 100
  Filled 2024-04-15 (×2): qty 50
  Filled 2024-04-15: qty 100
  Filled 2024-04-15: qty 50
  Filled 2024-04-15 (×2): qty 100
  Filled 2024-04-15: qty 50
  Filled 2024-04-15: qty 100
  Filled 2024-04-15 (×2): qty 50

## 2024-04-15 MED ORDER — POTASSIUM CHLORIDE 10 MEQ/100ML IV SOLN
10.0000 meq | INTRAVENOUS | Status: AC
  Administered 2024-04-15 (×4): 10 meq via INTRAVENOUS
  Filled 2024-04-15: qty 100

## 2024-04-15 MED ORDER — FENTANYL BOLUS VIA INFUSION: 25.0000 ug | INTRAVENOUS | Status: DC | PRN

## 2024-04-15 MED ORDER — POTASSIUM CHLORIDE 10 MEQ/100ML IV SOLN
10.0000 meq | INTRAVENOUS | Status: AC
  Administered 2024-04-15 (×6): 10 meq via INTRAVENOUS
  Filled 2024-04-15 (×6): qty 100

## 2024-04-15 MED ORDER — AMIODARONE IV BOLUS ONLY 150 MG/100ML
INTRAVENOUS | Status: DC
  Filled 2024-04-15: qty 100

## 2024-04-15 MED ORDER — ROSUVASTATIN CALCIUM 5 MG PO TABS
10.0000 mg | ORAL_TABLET | Freq: Every day | ORAL | Status: DC
  Administered 2024-04-15 – 2024-04-17 (×3): 10 mg
  Filled 2024-04-15 (×3): qty 2

## 2024-04-15 MED ORDER — POTASSIUM CHLORIDE CRYS ER 20 MEQ PO TBCR
40.0000 meq | EXTENDED_RELEASE_TABLET | Freq: Once | ORAL | Status: AC
  Administered 2024-04-15: 40 meq via ORAL
  Filled 2024-04-15: qty 2

## 2024-04-15 MED ORDER — SODIUM CHLORIDE 0.9 % IV SOLN
3.0000 g | Freq: Four times a day (QID) | INTRAVENOUS | Status: DC
  Administered 2024-04-15 – 2024-04-18 (×14): 3 g via INTRAVENOUS
  Filled 2024-04-15 (×14): qty 8

## 2024-04-15 MED ORDER — FENTANYL CITRATE (PF) 50 MCG/ML IJ SOSY
25.0000 ug | PREFILLED_SYRINGE | Freq: Once | INTRAMUSCULAR | Status: AC
  Administered 2024-04-15: 50 ug via INTRAVENOUS

## 2024-04-15 MED ORDER — FENTANYL CITRATE (PF) 50 MCG/ML IJ SOSY
50.0000 ug | PREFILLED_SYRINGE | INTRAMUSCULAR | Status: DC | PRN
  Filled 2024-04-15: qty 1

## 2024-04-15 MED ORDER — ORAL CARE MOUTH RINSE
15.0000 mL | OROMUCOSAL | Status: DC
  Administered 2024-04-15 – 2024-04-16 (×12): 15 mL via OROMUCOSAL

## 2024-04-15 MED ORDER — AMIODARONE HCL IN DEXTROSE 360-4.14 MG/200ML-% IV SOLN
30.0000 mg/h | INTRAVENOUS | Status: DC
  Administered 2024-04-15 – 2024-04-16 (×3): 30 mg/h via INTRAVENOUS
  Filled 2024-04-15 (×2): qty 200

## 2024-04-15 MED ORDER — MAGNESIUM SULFATE 2 GM/50ML IV SOLN
2.0000 g | Freq: Once | INTRAVENOUS | Status: AC
  Administered 2024-04-15: 2 g via INTRAVENOUS
  Filled 2024-04-15: qty 50

## 2024-04-15 MED ORDER — ORAL CARE MOUTH RINSE: 15.0000 mL | OROMUCOSAL | Status: DC | PRN

## 2024-04-15 MED ORDER — FENTANYL CITRATE (PF) 50 MCG/ML IJ SOSY: 50.0000 ug | PREFILLED_SYRINGE | INTRAMUSCULAR | Status: DC | PRN

## 2024-04-15 MED ORDER — SUCCINYLCHOLINE CHLORIDE 200 MG/10ML IV SOSY
PREFILLED_SYRINGE | INTRAVENOUS | Status: DC | PRN
  Administered 2024-04-15: 100 mg via INTRAVENOUS

## 2024-04-15 MED ORDER — FENTANYL 2500MCG IN NS 250ML (10MCG/ML) PREMIX INFUSION
0.0000 ug/h | INTRAVENOUS | Status: DC
  Administered 2024-04-15: 25 ug/h via INTRAVENOUS
  Filled 2024-04-15: qty 250

## 2024-04-15 MED ORDER — PHENYLEPHRINE HCL (PRESSORS) 10 MG/ML IV SOLN
INTRAVENOUS | Status: DC | PRN
  Administered 2024-04-15: 320 ug via INTRAVENOUS
  Administered 2024-04-15: 160 ug via INTRAVENOUS
  Administered 2024-04-15: 320 ug via INTRAVENOUS

## 2024-04-15 NOTE — Progress Notes (Signed)
 Patient converted into Afib RVR HR 170's, EKG recorded to verify. Trauma MD called, orders for IV metoprolol and labs to be drawn.

## 2024-04-15 NOTE — Progress Notes (Signed)
 RN notified in shift report that anesthesia placed OG tube during intubation despite patient having coretrack in place. RN did not remove OG out of fear of accidentally dislodging coretrack while removing OG.  RASS lower than goal - titrated propofol  until patient was arousable. Patient able to follow commands and move all extremities. Propofol  then titrated up to allow patient to be comfortable and prevent interference of care. Patient proved to be sensitive to titrations, being either unarousable or too awake.   Around 1400, patient remained restless despite propofol  increases. She followed commands, but could not settle in bed and was fighting ventilator. RN asked MD Rolan Sharps about adding fentanyl  to keep propofol  doses lower and new orders were placed.  Around 1445 pt blood pressure higher than SBP 160. MD Sharps notified and RN asked if ok to continue cardene  now that pt was off levo since 0800 and MD ordered cleviprex  for goal SBP <160.

## 2024-04-15 NOTE — Progress Notes (Addendum)
 Patient ID: Jeanne Ellison, female   DOB: [birth year], 83 y.o.   MRN: [id number] Follow up - Trauma Critical Care   Patient Details:    Jeanne Ellison is an 83 y.o. female.  Lines/tubes : Arterial Line 04/11/24 Right Radial (Active)  Site Assessment Clean, Dry, Intact 04/11/24 2030  Line Status Pulsatile blood flow 04/11/24 2030  Art Line Waveform Appropriate;Square wave test performed 04/11/24 2030  Art Line Interventions Zeroed and calibrated;Leveled;Flushed per protocol 04/11/24 2030  Color/Movement/Sensation Capillary refill less than 3 sec 04/11/24 2030  Dressing Type Transparent 04/11/24 2030  Dressing Status Clean, Dry, Intact 04/11/24 2030  Dressing Change Due 04/18/24 04/11/24 2030     Urethral Catheter Devin Latex 16 Fr. (Active)  Indication for Insertion or Continuance of Catheter Peri-operative use for selective surgical procedure - not to exceed 24 hours post-op 04/11/24 2030  Site Assessment Clean, Dry, Intact 04/11/24 2030  Catheter Maintenance Bag below level of bladder;Catheter secured;Drainage bag/tubing not touching floor;Insertion date on drainage bag;No dependent loops;Seal intact 04/11/24 2030  Collection Container Standard drainage bag 04/11/24 2030  Securement Method Adhesive securement device 04/11/24 2030  Urinary Catheter Interventions (if applicable) Unclamped 04/11/24 1926  Output (mL) 20 mL 04/12/24 1000    Microbiology/Sepsis markers: Results for orders placed or performed during the hospital encounter of 04/09/24  MRSA Next Gen by PCR, Nasal     Status: None   Collection Time: 04/09/24 12:21 PM   Specimen: Nasal Mucosa; Nasal Swab  Result Value Ref Range Status   MRSA by PCR Next Gen NOT DETECTED NOT DETECTED Final    Comment: (NOTE) The GeneXpert MRSA Assay (FDA approved for NASAL specimens only), is one component of a comprehensive MRSA colonization surveillance program. It is not intended to diagnose MRSA infection nor to guide or  monitor treatment for MRSA infections. Test performance is not FDA approved in patients less than 71 years old. Performed at [facility], [address], [place], [place] [address]     Anti-infectives:  Anti-infectives (From admission, onward)    Start     Dose/Rate Route Frequency Ordered Stop   04/15/24 0615  Ampicillin-Sulbactam (UNASYN) 3 g in sodium chloride 0.9 % 100 mL IVPB        3 g 200 mL/hr over 30 Minutes Intravenous Every 6 hours 04/15/24 0529     04/11/24 1600  ceFAZolin (ANCEF) 2-4 GM/100ML-% IVPB       Note to Pharmacy: Atanacio Race: cabinet override      04/11/24 1600 04/12/24 0414      Consults: Treatment Team:  Md, Trauma, MD Lanis Pupa, MD    Studies:    Events: R peripheral IV infiltrated   Subjective:    Overnight Issues:  Intubated overnight with worsening hypoxia on NRB. Also developed afib/rvr, transient hypotension and amio initiated. CCM consulted for assistance in mgmt.    Objective:  Vital signs for last 24 hours: Temp:  [97.7 F (36.5 C)-99.1 F (37.3 C)] 97.7 F (36.5 C) (11/16 0800) Pulse Rate:  [66-186] 68 (11/16 0945) Resp:  [16-30] 17 (11/16 0945) BP: (114-148)/(43-101) 131/58 (11/16 0945) SpO2:  [88 %-100 %] 97 % (11/16 0945) Arterial Line BP: (116-162)/(35-62) 146/50 (11/16 0945) FiO2 (%):  [50 %-100 %] 50 % (11/16 0806) Weight:  [65.6 kg] 65.6 kg (11/16 0500)  Hemodynamic parameters for last 24 hours: CVP:  [5 mmHg-11 mmHg] 5 mmHg  Intake/Output from previous day: 11/15 0701 - 11/16 0700 In: 2491.2 [I.V.:1684.2; IV Piggyback:807] Out:  2780 [Urine:2780]  Intake/Output this shift: Total I/O In: 398.8 [I.V.:185.9; IV Piggyback:213] Out: -   Vent settings for last 24 hours: Vent Mode: PRVC FiO2 (%):  [50 %-100 %] 50 % Set Rate:  [16 bmp] 16 bmp Vt Set:  [440 mL] 440 mL PEEP:  [5 cmH20-8 cmH20] 8 cmH20 Plateau Pressure:  [14 cmH20] 14 cmH20  Physical Exam:  General: intubated, sedated Neuro:  Intubated, sedated Resp: normal work of breathing CVS: RRR currently in 60s GI: abd soft, nondistended Extremities: calves soft  Results for orders placed or performed during the hospital encounter of 04/09/24 (from the past 24 hours)  I-STAT 7, (LYTES, BLD GAS, ICA, H+H)     Status: Abnormal   Collection Time: 04/14/24 11:33 AM  Result Value Ref Range   pH, Arterial 7.500 (H) 7.35 - 7.45   pCO2 arterial 29.6 (L) 32 - 48 mmHg   pO2, Arterial 70 (L) 83 - 108 mmHg   Bicarbonate 23.1 20.0 - 28.0 mmol/L   TCO2 24 22 - 32 mmol/L   O2 Saturation 95 %   Acid-Base Excess 0.0 0.0 - 2.0 mmol/L   Sodium 143 135 - 145 mmol/L   Potassium 3.2 (L) 3.5 - 5.1 mmol/L   Calcium, Ion 1.28 1.15 - 1.40 mmol/L   HCT 23.0 (L) 36.0 - 46.0 %   Hemoglobin 7.8 (L) 12.0 - 15.0 g/dL   Patient temperature 01.2 F    Collection site RADIAL, ALLEN'S TEST ACCEPTABLE    Drawn by RT    Sample type ARTERIAL   Glucose, capillary     Status: Abnormal   Collection Time: 04/14/24 11:43 AM  Result Value Ref Range   Glucose-Capillary 141 (H) 70 - 99 mg/dL  Glucose, capillary     Status: Abnormal   Collection Time: 04/14/24  3:48 PM  Result Value Ref Range   Glucose-Capillary 158 (H) 70 - 99 mg/dL  Glucose, capillary     Status: Abnormal   Collection Time: 04/14/24  8:01 PM  Result Value Ref Range   Glucose-Capillary 133 (H) 70 - 99 mg/dL  Glucose, capillary     Status: Abnormal   Collection Time: 04/14/24 11:37 PM  Result Value Ref Range   Glucose-Capillary 127 (H) 70 - 99 mg/dL  CBC     Status: Abnormal   Collection Time: 04/15/24 12:28 AM  Result Value Ref Range   WBC 11.7 (H) 4.0 - 10.5 K/uL   RBC 2.83 (L) 3.87 - 5.11 MIL/uL   Hemoglobin 8.2 (L) 12.0 - 15.0 g/dL   HCT 75.7 (L) 63.9 - 53.9 %   MCV 85.5 80.0 - 100.0 fL   MCH 29.0 26.0 - 34.0 pg   MCHC 33.9 30.0 - 36.0 g/dL   RDW 84.5 88.4 - 84.4 %   Platelets 228 150 - 400 K/uL   nRBC 0.2 0.0 - 0.2 %  Basic metabolic panel     Status: Abnormal    Collection Time: 04/15/24 12:28 AM  Result Value Ref Range   Sodium 141 135 - 145 mmol/L   Potassium 3.0 (L) 3.5 - 5.1 mmol/L   Chloride 107 98 - 111 mmol/L   CO2 22 22 - 32 mmol/L   Glucose, Bld 148 (H) 70 - 99 mg/dL   BUN 20 8 - 23 mg/dL   Creatinine, Ser 9.57 (L) 0.44 - 1.00 mg/dL   Calcium 8.6 (L) 8.9 - 10.3 mg/dL   GFR, Estimated >39 >39 mL/min   Anion gap 12 5 - 15  Magnesium     Status: None   Collection Time: 04/15/24 12:28 AM  Result Value Ref Range   Magnesium 2.1 1.7 - 2.4 mg/dL  Glucose, capillary     Status: Abnormal   Collection Time: 04/15/24  4:34 AM  Result Value Ref Range   Glucose-Capillary 157 (H) 70 - 99 mg/dL  I-STAT 7, (LYTES, BLD GAS, ICA, H+H)     Status: Abnormal   Collection Time: 04/15/24  5:44 AM  Result Value Ref Range   pH, Arterial 7.481 (H) 7.35 - 7.45   pCO2 arterial 32.6 32 - 48 mmHg   pO2, Arterial 189 (H) 83 - 108 mmHg   Bicarbonate 24.5 20.0 - 28.0 mmol/L   TCO2 25 22 - 32 mmol/L   O2 Saturation 100 %   Acid-Base Excess 1.0 0.0 - 2.0 mmol/L   Sodium 143 135 - 145 mmol/L   Potassium 3.7 3.5 - 5.1 mmol/L   Calcium, Ion 1.18 1.15 - 1.40 mmol/L   HCT 24.0 (L) 36.0 - 46.0 %   Hemoglobin 8.2 (L) 12.0 - 15.0 g/dL   Patient temperature 02.6 F    Sample type ARTERIAL   Glucose, capillary     Status: Abnormal   Collection Time: 04/15/24  7:16 AM  Result Value Ref Range   Glucose-Capillary 145 (H) 70 - 99 mg/dL    Assessment & Plan: Present on Admission:  SDH (subdural hematoma) (HCC)    LOS: 6 days   Additional comments:I reviewed the patient's new clinical lab test results. / 83 y/o F GLF R posterior scalp hematoma L subdural hematoma with 3mm midline shift - per Dr. Lanis, caffie, F/U CT H stable 11/10, neuro status change 11/12>>  repeat CTH with increased left SDH with increased midline shift and early uncal herniation. Patient moved to ICU for monitoring, Dr. Lanis notified and patient underwent L temporo-parietal  craniotomy 11/12. On nicardipine gtt for BP control. Neuro changes overnight. Repeat CTH done. NSGY has seen. Plan for LTEEG and Keppra. EEG has been normal - may D/C today per neuro/NS. Acute hypoxic ventilator dependent respiratory failure - extubated 11/11. Reintubated 11/16 for hypoxia on NRB. Resp cxs pending, on empiric unasyn 11/16 for possible aspiration. CCM managing - greatly appreciate assistance Afib/RVR - amio initiated, now sinus, CCM managing  HTN/freq PVCs - ICH parameters, On nicardipine gtt for BP control. Added home med combo beta blocker hydrochlorothiazide per tube) HLD DM - SSI R hand swelling - no fx on xray FEN - NPO, TF per cortrak; PICC 11/14 - lost peripheral access; cont thiamine to 500 q 8h. Na 141 from 142, trend. BMP tomorrow VTE - SCD's, chemical VTE held due to intracranial hemorrhage ID - none; afebrile WBC 11.7, resp cultures ordered 11/12, UA 11/12 neg Foley- foley replaced 11/14 for evident retention Dispo - ICU, neuro checks, therapy. Place PICC. Family present at bedside updated; primary RN updated on plans of care.  Critical Care Total Time*: 31 Minutes   Lonni Pizza, MD [facility]   04/15/2024  *Care during the described time interval was provided by me. I have reviewed this patient's available data, including medical history, events of note, physical examination and test results as part of my evaluation.

## 2024-04-15 NOTE — Progress Notes (Addendum)
 Inpatient Rehab Admissions Coordinator:  Attempted to contact pt's husband Jeanne Ellison. Left messages on both home phone and cell phone. Awaiting return call. Note pt intubated overnight. Will continue to follow for medical readiness.  Pt's husband, Jeanne Ellison returned call. Discussed CIR goals and expectations. He acknowledged understanding. He is supportive of pt pursuing CIR. He confirmed that family will be available 24/7 to provide support for pt after discharge.    Jeanne Yvone Cohens, MS, CCC-SLP Admissions Coordinator [phone]

## 2024-04-15 NOTE — Anesthesia Procedure Notes (Signed)
 Procedure Name: Intubation Date/Time: 04/15/2024 3:32 AM  Performed by: Vaughn Zebedee HERO, CRNAPre-anesthesia Checklist: Patient identified, Emergency Drugs available, Suction available and Patient being monitored Patient Re-evaluated:Patient Re-evaluated prior to induction Oxygen Delivery Method: Ambu bag Preoxygenation: Pre-oxygenation with 100% oxygen Induction Type: IV induction and Rapid sequence Laryngoscope Size: Glidescope and 3 Grade View: Grade I Tube type: Oral Tube size: 7.5 mm Number of attempts: 1 Airway Equipment and Method: Stylet Placement Confirmation: ETT inserted through vocal cords under direct vision, positive ETCO2 and breath sounds checked- equal and bilateral Secured at: 23 cm Tube secured with: ETT securement device. Dental Injury: Teeth and Oropharynx as per pre-operative assessment

## 2024-04-15 NOTE — Progress Notes (Signed)
 NEUROSURGERY PROGRESS NOTE  Patient had a seizure like episode yesterday. Follow up head CT was stable with no acute changes. She was reintubated yesterday and now on continuous EEG  Temp:  [97.7 F (36.5 C)-99.1 F (37.3 C)] 97.7 F (36.5 C) (11/16 0800) Pulse Rate:  [65-186] 65 (11/16 1100) Resp:  [16-30] 16 (11/16 1100) BP: (114-148)/(43-101) 117/50 (11/16 1100) SpO2:  [88 %-100 %] 98 % (11/16 1100) Arterial Line BP: (116-162)/(35-62) 134/47 (11/16 1000) FiO2 (%):  [50 %-100 %] 50 % (11/16 0806) Weight:  [65.6 kg] 65.6 kg (11/16 0500)    Suzen Chiquita Pean, NP 04/15/2024 11:52 AM

## 2024-04-15 NOTE — Progress Notes (Addendum)
 Patient has had worsening hypoxia tonight, and went into a-fib with RVR around midnight with rate in 150s-160s (confirmed on EKG, with some ST changes). Did not respond to 5mg  IV metoprolol x2, so amio bolus followed by infusion was given. Labs showed hypokalemia, potassium is being repleted. Patient remained in a-fib with rate in 130s, with progressive hypoxia. She was transitioned to 15L NRB, with sats in mid-80s and increased work of breathing on exam. She remained alert but endorsed subjective shortness of breath. She received lasix during the day yesterday, and repeat CXR now shows pulmonary edema. Hypoxia seems to be secondary to volume overload, and a-fib is likely secondary to hypoxia.  Anesthesia was called and patient was intubated at bedside. Had transient hypotension after induction which has improved, although patient is on low dose levophd. Following intubation patient converted back to sinus rhythm. Repeat EKG shows NSR with resolution of previous ST changes. O2 sats are now 99% on 100% FiO2. Another dose of lasix 60mg  IV has been given. Echo ordered. Will also consult PCCM for assistance.  Patient's son was present at bedside and updated on patient's clinical status and the plan of care.

## 2024-04-15 NOTE — Progress Notes (Signed)
 LTM EEG hooked up and running - no initial skin breakdown - push button tested - [facility] monitoring.

## 2024-04-15 NOTE — Plan of Care (Signed)
  Problem: Education: Goal: Knowledge of General Education information will improve Description: Including pain rating scale, medication(s)/side effects and non-pharmacologic comfort measures Outcome: Progressing   Problem: Clinical Measurements: Goal: Ability to maintain clinical measurements within normal limits will improve Outcome: Progressing Goal: Will remain free from infection Outcome: Progressing Goal: Diagnostic test results will improve Outcome: Progressing Goal: Respiratory complications will improve Outcome: Progressing Goal: Cardiovascular complication will be avoided Outcome: Progressing   Problem: Coping: Goal: Level of anxiety will decrease Outcome: Progressing   Problem: Elimination: Goal: Will not experience complications related to bowel motility Outcome: Progressing Goal: Will not experience complications related to urinary retention Outcome: Progressing   Problem: Pain Managment: Goal: General experience of comfort will improve and/or be controlled Outcome: Progressing   Problem: Safety: Goal: Ability to remain free from injury will improve Outcome: Progressing   Problem: Metabolic: Goal: Ability to maintain appropriate glucose levels will improve Outcome: Progressing   Problem: Skin Integrity: Goal: Risk for impaired skin integrity will decrease Outcome: Progressing   Problem: Tissue Perfusion: Goal: Adequacy of tissue perfusion will improve Outcome: Progressing

## 2024-04-15 NOTE — Procedures (Signed)
 Patient Name: Jeanne Ellison  MRN: [id number]  Epilepsy Attending: Arlin MALVA Krebs  Referring Physician/Provider: Claudene Toribio BROCKS, MD  Date: 04/15/2024 Duration: 24.54 mins  Patient history: 83 y.o. female left crani for SDH, was more appropriate immediately postop, now much more confused. EEG to evaluate for seizure   Level of alertness:  comatose/ lethargic   AEDs during EEG study: LEV, propofol  Technical aspects: This EEG study was done with scalp electrodes positioned according to the 10-20 International system of electrode placement. Electrical activity was reviewed with band pass filter of 1-70Hz , sensitivity of 7 uV/mm, display speed of 42mm/sec with a 60Hz  notched filter applied as appropriate. EEG data were recorded continuously and digitally stored.  Video monitoring was available and reviewed as appropriate.  Description: EEG showed continuous generalized and lateralized left hemisphere 3 to 5 Hz theta-delta slowing with overriding 13-15hz  beta activity. Hyperventilation and photic stimulation were not performed.     ABNORMALITY - Continuous slow, generalized and lateralized left hemisphere  IMPRESSION: This study is suggestive of cortical dysfunction arising from left  hemisphere likely secondary to underlying structural abnormality. Additionally there is severe diffuse encephalopathy likely related to sedation. No seizures or epileptiform discharges were seen throughout the recording.  Diane Mochizuki O Brandy Zuba

## 2024-04-15 NOTE — Progress Notes (Signed)
Stat  EEG complete - results pending.  

## 2024-04-15 NOTE — Progress Notes (Addendum)
..  Trauma Event Note    Reason for Call :  2106-called by primary RN Shelli to report episodes of unilateral facial twitching lasting 30-40 seconds, pt remained conscious and not reported to have a change in mentation. Dr. Dasie involved in conversation via speaker, she subsequently reviewed chart, pt currently on anticonvulsant regiment, repeat CTH done today as well as a recent LTEEG, no airway compromise noted at that time. Shelli will continue to monitor and update TMD with any further episodes or concerns.   0009-received call from [facility], pt now in rapid afb RVR, Shelli ask to page TMD directly for further orders. Further orders given for meds, labs. Pt subsequently started on amio. Pt was placed on NRB due to reports of SHOB.   9742- received call from TMD requesting I go to bedside to assess the patient as she was now reported to declining by bedside staff and she would meet me on at the bedside. On my arrival CXR being done, Dr. Dasie arrived, viewed xray, Lasix ordered and given, 02 Sat 86% on NRB, pt spontaneously opening her eyes and smiling at staff, Dr. Dasie explained to patient and her son in the room that she felt she needed to be placed back on vent for airway protection, pt and son consented.  0315-CRNA called for intubation, room setup, appropriate staff at bedside. Pt intubated by CRNA, pt did have episode of hypotension post intubation, Neo push given by anesthesia, Levo started, pt remained in rapid afib at that time.  Additional IV access obtained and patient converted back to NSR in 70's. Repeat EKG obtained. OG tube placed. Confirmation xray obtained.  Dr. Dasie to consult CCM for further vent management assistance.   Last imported Vital Signs BP 126/60   Pulse 69   Temp 97.7 F (36.5 C) (Axillary)   Resp 16   Ht 5' 5.5 (1.664 m)   Wt 144 lb 10 oz (65.6 kg)   SpO2 100%   BMI 23.70 kg/m   Trending CBC Recent Labs    04/13/24 0427 04/14/24 0320 04/14/24 1133  04/15/24 0028 04/15/24 0544  WBC 13.5* 11.9*  --  11.7*  --   HGB 8.2* 7.3* 7.8* 8.2* 8.2*  HCT 24.4* 21.5* 23.0* 24.2* 24.0*  PLT 187 174  --  228  --     Trending Coag's No results for input(s): APTT, INR in the last 72 hours.  Trending BMET Recent Labs    04/13/24 2054 04/14/24 0320 04/14/24 1133 04/15/24 0028 04/15/24 0544  NA 140 142 143 141 143  K 3.5 3.5 3.2* 3.0* 3.7  CL 107 108  --  107  --   CO2 23 22  --  22  --   BUN 20 21  --  20  --   CREATININE 0.31* 0.39*  --  0.42*  --   GLUCOSE 150* 118*  --  148*  --       Keawe Marcello Dee  Trauma Response RN  Please call TRN at [phone] for further assistance.

## 2024-04-15 NOTE — Consult Note (Signed)
 NAME:  Jeanne Ellison, MRN:  [id number], DOB:  Feb 21, 1941, LOS: 6 ADMISSION DATE:  04/09/2024, CONSULTATION DATE:  11/16 REFERRING MD:  Dr. Dasie, CHIEF COMPLAINT:  trauma; hypoxic respiratory failure   History of Present Illness:  Patient is a 83 yo F w/ pertinent PMH T2DM, HTN, HLD presents to [facility] on 11/10 level 1 trauma.  On 11/10 patient found down at home after fall. Brought to [facility] ED. EMS noted patient altered and combative. Patient intubated for airway protection. Hypertenisve w/ sbp 210. CT head showing L SDH w/ 3mm shift. NSG consulted. Started on cleviprex and keppra. Extubated on 11/11. On 11/12 worsening ams. CT head w/ enlarging left SDH 11mm w/ 8mm MLS, early uncal herniation. NSG taken to OR for craniotomy.  On 11/16 patient had worsening hypoxia placed on NRB. Went into afib RVR started on amio. K low repleted. Patient intubated. CXR showing pulmonary edema. Hypotensive post intubation on low dose levo. Patient coverted back into sinus rhythm post intubation. PCCM consulted.  Pertinent  Medical History   Past Medical History:  Diagnosis Date   Diabetes mellitus without complication (HCC)    Type II   Hypertension      Significant Hospital Events: Including procedures, antibiotic start and stop dates in addition to other pertinent events   11/10 level 1 trauma; fall left SDH on clevi; intubated 11/11 extubated 11/12 enlarging sdh taken to OR for crani 11/16 worsening hypoxia and afib rvr; intubated; pccm consulted  Interim History / Subjective:  See above  Objective    Blood pressure (!) 142/51, pulse 95, temperature 98.3 F (36.8 C), temperature source Axillary, resp. rate (!) 26, height 5' 5.5 (1.664 m), weight 68.7 kg, SpO2 90%.        Intake/Output Summary (Last 24 hours) at 04/15/2024 0427 Last data filed at 04/14/2024 2200 Gross per 24 hour  Intake 1947.7 ml  Output 2430 ml  Net -482.3 ml   Filed Weights   04/09/24 1018 04/13/24 0456 04/14/24  0500  Weight: 70.3 kg 66.3 kg 68.7 kg    Examination: General:  critically ill elderly appearing female on mech vent HEENT: MM pink/moist; ETT in place; left scalp dressing in place Neuro: sedate CV: s1s2, RRR, no m/r/g PULM:  dim clear BS bilaterally; on mech vent PRVC GI: soft, bsx4 active  Extremities: warm/dry, no edema    Resolved problem list   Assessment and Plan   Acute hypoxic respiratory failure Pulmonary edema Plan: -LTVV strategy with tidal volumes of 6-8 cc/kg ideal body weight -check ABG and adjust settings accordingly  -Wean PEEP/FiO2 for SpO2 >92% -VAP bundle in place -Daily SAT and SBT -PAD protocol in place -wean sedation for RASS goal 0 to -1 -Follow intermittent CXR PRN -will likely benefit w/ more diuresis later today; monitor UOP -send trach aspirate; start unasyn for possible aspiration  R posterior scalp hematoma L subdural hematoma with 3mm midline shift Plan: -per nsg and trauma -sbp goal per nsg; nicardipine on hold while hypotensive post intubation; levo to avoid hypotension -frequent neuro checks -limit sedating meds -keppra -Continue neuroprotective measures- normothermia, euglycemia, HOB greater than 30, head in neutral alignment, normocapnia, normoxia.  -pt/ot/slp when appropriate  Afib RVR Hypokalemia Plan: -tele monitoring -cont amio gtt for now -k repleted; trend and replete electrolytes as needed -echo ordered/pending  Hypotension: likely sedation related post intubation Plan: -levo for map goal >65 -wean sedation for RASS 0 to -1 -hold po anti-htn meds and cardene drip for now  Leukocytosis -  likely reactive Plan: -trend wbc/fever curve -send trach aspirate; start unasyn for possible aspiration  HTN HLD Plan: -hold anti-htn meds now hypotensive post intubation on levo -resume home statin  DMT2 Plan: -ssi and cbg monitoring  Anemia Plan: -trend cbc  Anxiety  Plan: -cont celexa -on xanax as needed at  home; currently on hold   Labs   CBC: Recent Labs  Lab 04/11/24 0343 04/12/24 0408 04/13/24 0427 04/14/24 0320 04/14/24 1133 04/15/24 0028  WBC 12.7* 15.2* 13.5* 11.9*  --  11.7*  HGB 10.7* 9.4* 8.2* 7.3* 7.8* 8.2*  HCT 31.7* 27.7* 24.4* 21.5* 23.0* 24.2*  MCV 85.2 85.8 85.9 86.0  --  85.5  PLT 200 195 187 174  --  228    Basic Metabolic Panel: Recent Labs  Lab 04/12/24 0408 04/12/24 1044 04/13/24 0427 04/13/24 2054 04/14/24 0320 04/14/24 1133 04/15/24 0028  NA 135  --  142 140 142 143 141  K 3.2*  --  3.7 3.5 3.5 3.2* 3.0*  CL 106  --  110 107 108  --  107  CO2 18*  --  22 23 22   --  22  GLUCOSE 160*  --  162* 150* 118*  --  148*  BUN 17  --  18 20 21   --  20  CREATININE 0.47  --  0.36* 0.31* 0.39*  --  0.42*  CALCIUM 8.8*  --  8.9 8.7* 8.6*  --  8.6*  MG  --  1.7 2.1  --  1.9  --  2.1  PHOS  --   --  <1.0* 3.1 2.6  --   --    GFR: Estimated Creatinine Clearance: 49 mL/min (A) (by C-G formula based on SCr of 0.42 mg/dL (L)). Recent Labs  Lab 04/09/24 0940 04/09/24 0954 04/12/24 0408 04/13/24 0427 04/14/24 0320 04/15/24 0028  WBC  --    < > 15.2* 13.5* 11.9* 11.7*  LATICACIDVEN 5.4*  --   --   --   --   --    < > = values in this interval not displayed.    Liver Function Tests: Recent Labs  Lab 04/09/24 0954  AST 26  ALT 12  ALKPHOS 48  BILITOT 0.8  PROT 7.5  ALBUMIN 4.3   No results for input(s): LIPASE, AMYLASE in the last 168 hours. No results for input(s): AMMONIA in the last 168 hours.  ABG    Component Value Date/Time   PHART 7.500 (H) 04/14/2024 1133   PCO2ART 29.6 (L) 04/14/2024 1133   PO2ART 70 (L) 04/14/2024 1133   HCO3 23.1 04/14/2024 1133   TCO2 24 04/14/2024 1133   ACIDBASEDEF 1.0 04/09/2024 1040   O2SAT 95 04/14/2024 1133     Coagulation Profile: Recent Labs  Lab 04/09/24 0954  INR 1.0    Cardiac Enzymes: No results for input(s): CKTOTAL, CKMB, CKMBINDEX, TROPONINI in the last 168  hours.  HbA1C: Hgb A1c MFr Bld  Date/Time Value Ref Range Status  04/13/2024 12:53 AM 6.0 (H) 4.8 - 5.6 % Final    Comment:    (NOTE) Diagnosis of Diabetes The following HbA1c ranges recommended by the American Diabetes Association (ADA) may be used as an aid in the diagnosis of diabetes mellitus.  Hemoglobin             Suggested A1C NGSP%              Diagnosis  <5.7  Non Diabetic  5.7-6.4                Pre-Diabetic  >6.4                   Diabetic  <7.0                   Glycemic control for                       adults with diabetes.      CBG: Recent Labs  Lab 04/14/24 0718 04/14/24 1143 04/14/24 1548 04/14/24 2001 04/14/24 2337  GLUCAP 132* 141* 158* 133* 127*    Review of Systems:   Patient is intubated; therefore, history has been obtained from chart review.    Past Medical History:  She,  has a past medical history of Diabetes mellitus without complication (HCC) and Hypertension.   Surgical History:   Past Surgical History:  Procedure Laterality Date   ABDOMINAL HYSTERECTOMY     APPENDECTOMY       Social History:   reports that she has never smoked. She has never used smokeless tobacco. She reports that she does not currently use alcohol. She reports that she does not use drugs.   Family History:  Her family history is not on file.   Allergies Allergies  Allergen Reactions   Sulfa Antibiotics      Home Medications  Prior to Admission medications   Medication Sig Start Date End Date Taking? Authorizing Provider  ALPRAZolam (XANAX) 1 MG tablet Take 0.5-1 mg by mouth 2 (two) times daily as needed. 04/02/24  Yes [provider]  Ascorbic Acid (VITAMIN C PO) Take 2 tablets by mouth daily.   Yes [provider]  bisoprolol-hydrochlorothiazide (ZIAC) 2.5-6.25 MG tablet Take 1 tablet by mouth daily. 03/19/24  Yes [provider]  Cholecalciferol (VITAMIN D3 PO) Take 1-2 tablets by mouth daily.   Yes  [provider]  citalopram (CELEXA) 20 MG tablet Take 20 mg by mouth daily. 04/02/24  Yes [provider]  FOLIC ACID PO Take 1-2 tablets by mouth daily.   Yes [provider]  metFORMIN (GLUCOPHAGE-XR) 750 MG 24 hr tablet Take 750-1,500 mg by mouth daily. 03/07/24  Yes [provider]  rosuvastatin (CRESTOR) 10 MG tablet Take 10 mg by mouth at bedtime. 03/07/24  Yes [provider]  Turmeric (QC TUMERIC COMPLEX PO) Take 1 tablet by mouth daily.   Yes [provider]  VITAMIN E PO Take 1 tablet by mouth daily.   Yes [provider]     Critical care time: 45 minutes     JD Emilio RIGGERS [facility] 04/15/2024, 4:27 AM  Please see [email or web address] for pager details.  From 7A-7P if no response, please call [phone]. After hours, please call ELink [phone].

## 2024-04-16 ENCOUNTER — Inpatient Hospital Stay (HOSPITAL_COMMUNITY)

## 2024-04-16 ENCOUNTER — Encounter (HOSPITAL_COMMUNITY): Payer: Self-pay | Admitting: Neurosurgery

## 2024-04-16 DIAGNOSIS — J90 Pleural effusion, not elsewhere classified: Secondary | ICD-10-CM

## 2024-04-16 DIAGNOSIS — E440 Moderate protein-calorie malnutrition: Secondary | ICD-10-CM

## 2024-04-16 DIAGNOSIS — R569 Unspecified convulsions: Secondary | ICD-10-CM | POA: Diagnosis not present

## 2024-04-16 DIAGNOSIS — R0603 Acute respiratory distress: Secondary | ICD-10-CM | POA: Diagnosis not present

## 2024-04-16 DIAGNOSIS — I480 Paroxysmal atrial fibrillation: Secondary | ICD-10-CM

## 2024-04-16 LAB — GLUCOSE, CAPILLARY
Glucose-Capillary: 113 mg/dL — ABNORMAL HIGH (ref 70–99)
Glucose-Capillary: 112 mg/dL — ABNORMAL HIGH (ref 70–99)
Glucose-Capillary: 168 mg/dL — ABNORMAL HIGH (ref 70–99)
Glucose-Capillary: 110 mg/dL — ABNORMAL HIGH (ref 70–99)
Glucose-Capillary: 130 mg/dL — ABNORMAL HIGH (ref 70–99)
Glucose-Capillary: 111 mg/dL — ABNORMAL HIGH (ref 70–99)

## 2024-04-16 LAB — TRIGLYCERIDES: Triglycerides: 140 mg/dL (ref <150)

## 2024-04-16 LAB — BASIC METABOLIC PANEL WITH GFR
Anion gap: 9 (ref 5–15)
BUN: 27 mg/dL — ABNORMAL HIGH (ref 8–23)
CO2: 24 mmol/L (ref 22–32)
Calcium: 8.2 mg/dL — ABNORMAL LOW (ref 8.9–10.3)
Chloride: 106 mmol/L (ref 98–111)
Creatinine, Ser: 0.55 mg/dL (ref 0.44–1.00)
GFR, Estimated: >60 mL/min (ref >60)
Glucose, Bld: 110 mg/dL — ABNORMAL HIGH (ref 70–99)
Potassium: 4.5 mmol/L (ref 3.5–5.1)
Sodium: 139 mmol/L (ref 135–145)

## 2024-04-16 LAB — CBC
HCT: 21.6 % — ABNORMAL LOW (ref 36.0–46.0)
Hemoglobin: 7.1 g/dL — ABNORMAL LOW (ref 12.0–15.0)
MCH: 29.1 pg (ref 26.0–34.0)
MCHC: 32.9 g/dL (ref 30.0–36.0)
MCV: 88.5 fL (ref 80.0–100.0)
Platelets: 226 K/uL (ref 150–400)
RBC: 2.44 MIL/uL — ABNORMAL LOW (ref 3.87–5.11)
RDW: 15.8 % — ABNORMAL HIGH (ref 11.5–15.5)
WBC: 8.8 K/uL (ref 4.0–10.5)
nRBC: 0.3 % — ABNORMAL HIGH (ref 0.0–0.2)

## 2024-04-16 LAB — ECHOCARDIOGRAM COMPLETE
AR max vel: 2.8 cm2
AV Area VTI: 2.69 cm2
AV Area mean vel: 2.46 cm2
AV Mean grad: 7 mmHg
AV Peak grad: 10 mmHg
Ao pk vel: 1.58 m/s
Area-P 1/2: 3.77 cm2
Height: 65.5 in
S' Lateral: 2.1 cm
Weight: 2409.19 [oz_av]

## 2024-04-16 LAB — MAGNESIUM: Magnesium: 2.3 mg/dL (ref 1.7–2.4)

## 2024-04-16 LAB — PHOSPHORUS: Phosphorus: 3.1 mg/dL (ref 2.5–4.6)

## 2024-04-16 MED ORDER — LEVETIRACETAM (KEPPRA) 500 MG/5 ML ADULT IV PUSH
500.0000 mg | Freq: Two times a day (BID) | INTRAVENOUS | Status: DC
  Administered 2024-04-16 (×2): 500 mg via INTRAVENOUS
  Filled 2024-04-16 (×2): qty 5

## 2024-04-16 MED ORDER — LABETALOL HCL 5 MG/ML IV SOLN
20.0000 mg | INTRAVENOUS | Status: DC | PRN
  Administered 2024-04-16 – 2024-04-20 (×10): 20 mg via INTRAVENOUS
  Filled 2024-04-16 (×10): qty 4

## 2024-04-16 MED ORDER — FUROSEMIDE 10 MG/ML IJ SOLN
40.0000 mg | Freq: Once | INTRAMUSCULAR | Status: AC
  Administered 2024-04-16: 40 mg via INTRAVENOUS
  Filled 2024-04-16: qty 4

## 2024-04-16 MED ORDER — ENOXAPARIN SODIUM 30 MG/0.3ML IJ SOSY
30.0000 mg | PREFILLED_SYRINGE | Freq: Two times a day (BID) | INTRAMUSCULAR | Status: DC
  Administered 2024-04-16 – 2024-04-22 (×13): 30 mg via SUBCUTANEOUS
  Filled 2024-04-16 (×13): qty 0.3

## 2024-04-16 MED ORDER — ORAL CARE MOUTH RINSE: 15.0000 mL | OROMUCOSAL | Status: DC | PRN

## 2024-04-16 NOTE — Progress Notes (Signed)
 eLink Physician-Brief Progress Note Patient Name: Jeanne Ellison DOB: 03/20/1941 MRN: [id number]   Date of Service  04/16/2024  HPI/Events of Note  83 yo F w/ pertinent PMH T2DM, HTN, HLD presents to [facility] on 11/10 level 1 trauma with left craniotomy for subdural hematoma with multicompartmental bleed.  Maximum dose on Cleviprex, no PRNs ordered.  Holding home bisoprolol-chlorothiazide as it is nonformulary.  Was previously hypotensive.  Goal SBP less than 160  eICU Interventions  Add labetalol as needed        Emillio Ngo 04/16/2024, 9:35 PM

## 2024-04-16 NOTE — Procedures (Signed)
 Patient Name: Jeanne Ellison  MRN: [id number]  Epilepsy Attending: Arlin MALVA Krebs  Referring Physician/Provider: Claudene Toribio BROCKS, MD  Duration: 04/16/2024 1017 to 04/16/2024 1310   Patient history: 83 y.o. female left crani for SDH, was more appropriate immediately postop, now much more confused. EEG to evaluate for seizure    Level of alertness:  awake, asleep   AEDs during EEG study: LEV   Technical aspects: This EEG study was done with scalp electrodes positioned according to the 10-20 International system of electrode placement. Electrical activity was reviewed with band pass filter of 1-70Hz , sensitivity of 7 uV/mm, display speed of 20mm/sec with a 60Hz  notched filter applied as appropriate. EEG data were recorded continuously and digitally stored.  Video monitoring was available and reviewed as appropriate.   Description: Eeg showed posterior dominant rhythm of 9   Hz activity of moderate voltage (25-35 uV) seen predominantly in posterior head regions, symmetric and reactive to eye opening and eye closing. Sleep was characterized by vertex waves, sleep spindles (12 to 14 Hz), maximal frontocentral region. EEG also showed intermittent generalized and lateralized left hemisphere 3 to 5 Hz theta-delta slowing Hyperventilation and photic stimulation were not performed.      ABNORMALITY -  Intermittent slow, generalized and lateralized left hemisphere   IMPRESSION: This study is suggestive of cortical dysfunction arising from left  hemisphere likely secondary to underlying structural abnormality. Additionally there was mild diffuse encephalopathy. No seizures or epileptiform discharges were seen throughout the recording.  Breely Panik O Lacrystal Barbe

## 2024-04-16 NOTE — Progress Notes (Signed)
 PT Cancellation Note  Patient Details Name: Jeanne Ellison MRN: [id number] DOB: Mar 29, 1941   Cancelled Treatment:    Reason Eval/Treat Not Completed: Medical issues which prohibited therapy - Pt now intubated and sedated, per RN hold therapies for now. PT to f/u when appropriate.   Qiara Minetti S, PT DPT [facility] Services Secure Chat Preferred  Office [phone]   Jeanne Ellison 04/16/2024, 8:27 AM

## 2024-04-16 NOTE — Progress Notes (Signed)
  NEUROSURGERY PROGRESS NOTE   Pt seen and examined. No issues overnight. Re-intubated over weekend.  EXAM: Temp:  [97.6 F (36.4 C)-98.5 F (36.9 C)] 98.5 F (36.9 C) (11/17 1559) Pulse Rate:  [56-102] 98 (11/17 1715) Resp:  [11-30] 24 (11/17 1715) BP: (104-159)/(43-59) 159/52 (11/17 1700) SpO2:  [93 %-100 %] 94 % (11/17 1715) Arterial Line BP: (88-184)/(40-64) 167/51 (11/17 1715) FiO2 (%):  [40 %] 40 % (11/17 0810) Weight:  [68.3 kg] 68.3 kg (11/17 0500) Intake/Output      11/16 0701 11/17 0700 11/17 0701 11/18 0700   P.O.     I.V. (mL/kg) 1046 (15.3) 382.9 (5.6)   IV Piggyback 1249.5 115   Total Intake(mL/kg) 2295.4 (33.6) 497.9 (7.3)   Urine (mL/kg/hr) 1405 (0.9) 1197 (1.7)   Stool     Total Output 1405 1197   Net +890.4 -699.1         Awake, alert Nods appropriately Breathing over vent MAE symmetrically Wound c/d/I   LABS: Lab Results  Component Value Date   CREATININE 0.55 04/16/2024   BUN 27 (H) 04/16/2024   NA 139 04/16/2024   K 4.5 04/16/2024   CL 106 04/16/2024   CO2 24 04/16/2024   Lab Results  Component Value Date   WBC 8.8 04/16/2024   HGB 7.1 (L) 04/16/2024   HCT 21.6 (L) 04/16/2024   MCV 88.5 04/16/2024   PLT 226 04/16/2024     IMPRESSION: - 83 y.o. female POD# 5 s/p left crani for SDH, appears neurologically well. EEG remains negative - Pulmonary edema and associated VDRF, improving - Afib w RVR improving  PLAN: - Cont supportive care per trauma - Can likely d/c EEG   Gerldine Maizes, MD [facility]

## 2024-04-16 NOTE — Consult Note (Addendum)
 NAME:  Jeanne Ellison, MRN:  [id number], DOB:  [birth year], LOS: 7 ADMISSION DATE:  04/09/2024, CONSULTATION DATE:  11/16 REFERRING MD:  Dr. Dasie, CHIEF COMPLAINT:  trauma; hypoxic respiratory failure   History of Present Illness:  Patient is a 83 yo F w/ pertinent PMH T2DM, HTN, HLD presents to [facility] on 11/10 level 1 trauma.  On 11/10 patient found down at home after fall. Brought to [facility] ED. EMS noted patient altered and combative. Patient intubated for airway protection. Hypertenisve w/ sbp 210. CT head showing L SDH w/ 3mm shift. NSG consulted. Started on cleviprex and keppra. Extubated on 11/11. On 11/12 worsening ams. CT head w/ enlarging left SDH 11mm w/ 8mm MLS, early uncal herniation. NSG taken to OR for craniotomy.  On 11/16 patient had worsening hypoxia placed on NRB. Went into afib RVR started on amio. K low repleted. Patient intubated. CXR showing pulmonary edema. Hypotensive post intubation on low dose levo. Patient coverted back into sinus rhythm post intubation. PCCM consulted.  Pertinent  Medical History   Past Medical History:  Diagnosis Date   Diabetes mellitus without complication (HCC)    Type II   Hypertension      Significant Hospital Events: Including procedures, antibiotic start and stop dates in addition to other pertinent events   11/10 level 1 trauma; fall left SDH on clevi; intubated 11/11 extubated 11/12 enlarging sdh taken to OR for crani 11/16 worsening hypoxia and afib rvr; intubated; pccm consulted  Interim History / Subjective:  Patient is afebrile No overnight issues, remained on propofol and fentanyl FiO2 titrated down to 40% and PEEP of 5, tolerating spontaneous breathing trial Repeat x-ray chest is pending Remain in sinus rhythm Objective    Blood pressure (!) 121/57, pulse 69, temperature 97.6 F (36.4 C), temperature source Axillary, resp. rate 18, height 5' 5.5 (1.664 m), weight 68.3 kg, SpO2 96%. CVP:  [3 mmHg-14 mmHg] 5 mmHg  Vent  Mode: PRVC FiO2 (%):  [40 %-50 %] 40 % Set Rate:  [16 bmp] 16 bmp Vt Set:  [440 mL] 440 mL PEEP:  [8 cmH20] 8 cmH20 Plateau Pressure:  [14 cmH20-19 cmH20] 19 cmH20   Intake/Output Summary (Last 24 hours) at 04/16/2024 0947 Last data filed at 04/16/2024 9062 Gross per 24 hour  Intake 1896.62 ml  Output 1492 ml  Net 404.62 ml   Filed Weights   04/14/24 0500 04/15/24 0500 04/16/24 0500  Weight: 68.7 kg 65.6 kg 68.3 kg    Examination: General: Crtitically ill-appearing elderly female, orally intubated HEENT: Status post left craniotomy, eyes anicteric.  ETT and cortrak in place Neuro: Eyes open, following simple commands Chest: Reduced air entry at the bases bilaterally, no wheezes Heart: Regular rate and rhythm, no murmurs or gallops Abdomen: Soft, nondistended, bowel sounds present  Labs and images reviewed  Patient Lines/Drains/Airways Status     Active Line/Drains/Airways     Name Placement date Placement time Site Days   Arterial Line 04/11/24 Right Radial 04/11/24  1550  Radial  5   Peripheral IV 04/15/24 20 G Anterior;Distal;Left Forearm 04/15/24  0430  Forearm  1   Peripheral IV 04/15/24 20 G Anterior;Left;Proximal Antecubital 04/15/24  1645  Antecubital  1   PICC Double Lumen 04/13/24 Right Basilic 37 cm 0 cm 04/13/24  8798  -- 3   Urethral Catheter Amy Brown Straight-tip 14 Fr. 04/13/24  1654  Straight-tip  3   Airway 7.5 mm 04/15/24  0332  -- 1   Small Bore  Feeding Tube 10 Fr. Left nare Marking at nare/corner of mouth 62 cm 04/11/24  1127  Left nare  5   Wound 04/11/24 1909 Surgical Closed Surgical Incision Head 04/11/24  1909  Head  5        Resolved problem list   Assessment and Plan  Acute hypoxic respiratory failure Pulmonary edema in the setting of A-fib with RVR Possible aspiration pneumonia Bilateral pleural effusion Continue lung protective ventilation VAP prevention bundle in place PAD protocol with propofol and fentanyl with RASS goal  -1 Currently patient is tolerating spontaneous breathing trial Continue Lasix Remain in sinus rhythm Continue IV Unasyn to complete 7 days therapy Respiratory culture is growing mixed flora, follow-up sensitivities  R posterior scalp hematoma L subdural hematoma with 3mm midline shift Small left frontal/parietal subarachnoid hemorrhage, traumatic Patient underwent craniotomy and evacuation of hematoma Repeat head CT showed a residual left subdural hemorrhage with slight midline shift Continue Keppra for seizure prophylaxis Keep head and of the bed elevated to 45 degrees  Paroxysmal Afib RVR Hypokalemia, corrected Patient converted back to sinus rhythm Continue amiodarone Not a candidate for anticoagulation for now considering subdural hemorrhage and subarachnoid hemorrhage Hypokalemia has corrected  Hypotension: likely sedation related post intubation Patient is off vasopressor support  HTN HLD Continue statin Holding antihypertensive meds  DMT2 Patient's hemoglobin A1c is 6 Blood sugars are controlled Continue sliding scale insulin with CBG goal 140-180  Anemia due to critical illness Hemoglobin is slowly drifting down, currently at 7.1, closely monitor CBC and transfuse if hemoglobin less than 7  Anxiety  Continue celexa on xanax as needed at home; currently on hold  Moderate malnutrition Tube feeds on hold for now for possible extubation Continue dietary supplements   Labs   CBC: Recent Labs  Lab 04/13/24 0427 04/14/24 0320 04/14/24 1133 04/15/24 0028 04/15/24 0544 04/15/24 0848 04/16/24 0500  WBC 13.5* 11.9*  --  11.7*  --  10.4 8.8  HGB 8.2* 7.3* 7.8* 8.2* 8.2* 7.8* 7.1*  HCT 24.4* 21.5* 23.0* 24.2* 24.0* 23.5* 21.6*  MCV 85.9 86.0  --  85.5  --  87.0 88.5  PLT 187 174  --  228  --  214 226    Basic Metabolic Panel: Recent Labs  Lab 04/13/24 0427 04/13/24 2054 04/14/24 0320 04/14/24 1133 04/15/24 0028 04/15/24 0544 04/15/24 0848  04/15/24 1424 04/16/24 0500  NA 142 140 142   < > 141 143 140 144 139  K 3.7 3.5 3.5   < > 3.0* 3.7 3.5 3.3* 4.5  CL 110 107 108  --  107  --  107 110 106  CO2 22 23 22   --  22  --  24 24 24   GLUCOSE 162* 150* 118*  --  148*  --  150* 102* 110*  BUN 18 20 21   --  20  --  20 24* 27*  CREATININE 0.36* 0.31* 0.39*  --  0.42*  --  0.46 0.40* 0.55  CALCIUM 8.9 8.7* 8.6*  --  8.6*  --  8.2* 8.3* 8.2*  MG 2.1  --  1.9  --  2.1  --  2.0  --  2.3  PHOS <1.0* 3.1 2.6  --   --   --  2.9  --  3.1   < > = values in this interval not displayed.   GFR: Estimated Creatinine Clearance: 49 mL/min (by C-G formula based on SCr of 0.55 mg/dL). Recent Labs  Lab 04/14/24 0320 04/15/24 0028 04/15/24  0848 04/16/24 0500  WBC 11.9* 11.7* 10.4 8.8    Liver Function Tests: Recent Labs  Lab 04/09/24 0954  AST 26  ALT 12  ALKPHOS 48  BILITOT 0.8  PROT 7.5  ALBUMIN 4.3   No results for input(s): LIPASE, AMYLASE in the last 168 hours. No results for input(s): AMMONIA in the last 168 hours.  ABG    Component Value Date/Time   PHART 7.481 (H) 04/15/2024 0544   PCO2ART 32.6 04/15/2024 0544   PO2ART 189 (H) 04/15/2024 0544   HCO3 24.5 04/15/2024 0544   TCO2 25 04/15/2024 0544   ACIDBASEDEF 1.0 04/09/2024 1040   O2SAT 100 04/15/2024 0544     Coagulation Profile: Recent Labs  Lab 04/09/24 0954  INR 1.0    Cardiac Enzymes: No results for input(s): CKTOTAL, CKMB, CKMBINDEX, TROPONINI in the last 168 hours.  HbA1C: Hgb A1c MFr Bld  Date/Time Value Ref Range Status  04/13/2024 12:53 AM 6.0 (H) 4.8 - 5.6 % Final    Comment:    (NOTE) Diagnosis of Diabetes The following HbA1c ranges recommended by the American Diabetes Association (ADA) may be used as an aid in the diagnosis of diabetes mellitus.  Hemoglobin             Suggested A1C NGSP%              Diagnosis  <5.7                   Non Diabetic  5.7-6.4                Pre-Diabetic  >6.4                    Diabetic  <7.0                   Glycemic control for                       adults with diabetes.      CBG: Recent Labs  Lab 04/15/24 1536 04/15/24 1947 04/15/24 2334 04/16/24 0328 04/16/24 0727  GLUCAP 106* 122* 115* 113* 112*    The patient is critically ill due to acute respiratory failure with hypoxia/A-fib with RVR requiring titration of ventilator.  Critical care was necessary to treat or prevent imminent or life-threatening deterioration.  Critical care was time spent personally by me on the following activities: development of treatment plan with patient and/or surrogate as well as nursing, discussions with consultants, evaluation of patient's response to treatment, examination of patient, obtaining history from patient or surrogate, ordering and performing treatments and interventions, ordering and review of laboratory studies, ordering and review of radiographic studies, pulse oximetry, re-evaluation of patient's condition and participation in multidisciplinary rounds.   During this encounter critical care time was devoted to patient care services described in this note for 40 minutes.   Valinda Novas, MD  Pulmonary Critical Care See Amion for pager If no response to pager, please call [phone] until 7pm After 7pm, Please call E-link [phone]

## 2024-04-16 NOTE — Plan of Care (Signed)

## 2024-04-16 NOTE — Progress Notes (Addendum)
 RN spoke with SLP about patient's recent extubation and whether patient could get a swallow evaluation today. Per SLP, patient cannot undergo yale bedside swallow test since she has been seen by SLP prior to intubation. SLP recommends a few ice chips in moderation until they can see her again.   Patient did well with ice chips, no coughing. Educated family on limited ice chip offerings, one ice chip at a time, and to slowly offer more ice chips.

## 2024-04-16 NOTE — Progress Notes (Signed)
 SLP Cancellation Note  Patient Details Name: Jeanne Ellison MRN: [id number] DOB: [birth year]   Cancelled treatment:       Reason Eval/Treat Not Completed: Medical issues which prohibited therapy (now intubated and sedated). SLP will f/u when able.    Damien Blumenthal, M.A., CCC-SLP Speech Language Pathology, Acute Rehabilitation Services  Secure Chat preferred [phone]  04/16/2024, 9:14 AM

## 2024-04-16 NOTE — Procedures (Signed)
 Extubation Procedure Note  Patient Details:   Name: Jeanne Ellison DOB: 1941-03-01 MRN: [id number]   Airway Documentation:    Vent end date: 04/16/24 Vent end time: 1128   Evaluation  O2 sats: stable throughout Complications: No apparent complications Patient did tolerate procedure well. Bilateral Breath Sounds: Clear, Diminished   Yes  RT extubated patient to 4L [name] per MD order with RN at bedside. Positive cuff leak noted. Patient tolerated well. No stridor or distress at this time. RT will continue to monitor as needed.   Paulla ONEIDA Gaskins 04/16/2024, 11:31 AM

## 2024-04-16 NOTE — Procedures (Addendum)
 Patient Name: Jeanne Ellison  MRN: [id number]  Epilepsy Attending: Arlin MALVA Krebs  Referring Physician/Provider: Claudene Toribio BROCKS, MD  Duration: 04/15/2024 1017 to 04/16/2024 1017   Patient history: 83 y.o. female left crani for SDH, was more appropriate immediately postop, now much more confused. EEG to evaluate for seizure    Level of alertness:  comatose/ lethargic    AEDs during EEG study: LEV, propofol   Technical aspects: This EEG study was done with scalp electrodes positioned according to the 10-20 International system of electrode placement. Electrical activity was reviewed with band pass filter of 1-70Hz , sensitivity of 7 uV/mm, display speed of 46mm/sec with a 60Hz  notched filter applied as appropriate. EEG data were recorded continuously and digitally stored.  Video monitoring was available and reviewed as appropriate.   Description: EEG initially showed continuous generalized and lateralized left hemisphere 3 to 5 Hz theta-delta slowing with overriding 13-15hz  beta activity. Gradually EEG improved and showed posterior dominant rhythm of 9   Hz activity of moderate voltage (25-35 uV) seen predominantly in posterior head regions, symmetric and reactive to eye opening and eye closing. Sleep was characterized by vertex waves, sleep spindles (12 to 14 Hz), maximal frontocentral region. EEG also showed intermittent generalized and lateralized left hemisphere 3 to 5 Hz theta-delta slowing Hyperventilation and photic stimulation were not performed.      ABNORMALITY - Continuous slow, generalized and lateralized left hemisphere   IMPRESSION: This study is suggestive of cortical dysfunction arising from left  hemisphere likely secondary to underlying structural abnormality. Additionally there was severe diffuse encephalopathy likely related to sedation which gradually improved to mild diffuse encephalopathy. No seizures or epileptiform discharges were seen throughout the recording.

## 2024-04-16 NOTE — Progress Notes (Signed)
 OT Cancellation Note  Patient Details Name: Jeanne Ellison MRN: [id number] DOB: 1940/11/06   Cancelled Treatment:    Reason Eval/Treat Not Completed: Patient not medically ready (Pt now intubated and sedated, per RN hold therapies for now. OT to f/u when appropriate.)  [name] 04/16/2024, 8:14 AM

## 2024-04-16 NOTE — Progress Notes (Signed)
 Patient ID: Jeanne Ellison, female   DOB: 14-Nov-1940, 83 y.o.   MRN: [id number] Follow up - Trauma Critical Care   Patient Details:    Jeanne Ellison is an 83 y.o. female.  Lines/tubes : Airway 7.5 mm (Active)  Secured at (cm) 24 cm 04/16/24 0810  Measured From Lips 04/16/24 0810  Secured Location Right 04/16/24 0810  Secured By [employer] 04/16/24 0810  Bite Block No 04/16/24 0509  Tube Holder Repositioned Yes 04/16/24 0810  Prone position No 04/16/24 0509  Cuff Pressure (cm H2O) Clear OR 27-39 CmH2O 04/16/24 0810  Site Condition Dry 04/16/24 0810     PICC Double Lumen 04/13/24 Right Basilic 37 cm 0 cm (Active)  Indication for Insertion or Continuance of Line Prolonged intravenous therapies 04/15/24 2000  Exposed Catheter (cm) 0 cm 04/13/24 1201  Site Assessment Clean, Dry, Intact 04/15/24 2000  Lumen #1 Status Infusing 04/15/24 2000  Lumen #2 Status Infusing 04/15/24 2000  Dressing Type Transparent 04/15/24 2000  Dressing Status Antimicrobial disc/dressing in place 04/15/24 2000  Line Care Connections checked and tightened 04/15/24 2000  Line Adjustment (NICU/IV Team Only) No 04/15/24 0800  Dressing Intervention New dressing 04/13/24 1201  Dressing Change Due 04/20/24 04/15/24 2000     Arterial Line 04/11/24 Right Radial (Active)  Site Assessment Clean, Dry, Intact 04/15/24 2000  Line Status Pulsatile blood flow 04/15/24 2000  Art Line Waveform Appropriate 04/15/24 2000  Art Line Interventions Zeroed and calibrated;Flushed per protocol;Connections checked and tightened 04/15/24 2000  Color/Movement/Sensation Capillary refill less than 3 sec 04/15/24 2000  Dressing Type Transparent 04/15/24 2000  Dressing Status Clean, Dry, Intact;Antimicrobial disc in place 04/15/24 2000  Dressing Change Due 04/18/24 04/15/24 2000     Urethral Catheter Amy Delores Straight-tip 14 Fr. (Active)  Indication for Insertion or Continuance of Catheter Unstable critically ill  patients first 24-48 hours (See Criteria) 04/16/24 0800  Site Assessment Clean, Dry, Intact 04/16/24 0800  Catheter Maintenance Bag below level of bladder;Catheter secured;No dependent loops;Drainage bag/tubing not touching floor 04/16/24 0800  Collection Container Standard drainage bag 04/16/24 0800  Securement Method Adhesive securement device 04/16/24 0800  Urinary Catheter Interventions (if applicable) Unclamped 04/13/24 2000  Input (mL) 0 mL 04/13/24 2300  Output (mL) 37 mL 04/16/24 0800    Microbiology/Sepsis markers: Results for orders placed or performed during the hospital encounter of 04/09/24  MRSA Next Gen by PCR, Nasal     Status: None   Collection Time: 04/09/24 12:21 PM   Specimen: Nasal Mucosa; Nasal Swab  Result Value Ref Range Status   MRSA by PCR Next Gen NOT DETECTED NOT DETECTED Final    Comment: (NOTE) The GeneXpert MRSA Assay (FDA approved for NASAL specimens only), is one component of a comprehensive MRSA colonization surveillance program. It is not intended to diagnose MRSA infection nor to guide or monitor treatment for MRSA infections. Test performance is not FDA approved in patients less than 24 years old. Performed at [facility], [address] [place], [place] [address]   Culture, Respiratory w Gram Stain     Status: None (Preliminary result)   Collection Time: 04/15/24 10:41 AM   Specimen: Tracheal Aspirate; Respiratory  Result Value Ref Range Status   Specimen Description TRACHEAL ASPIRATE  Final   Special Requests NONE  Final   Gram Stain   Final    NO WBC SEEN FEW GRAM POSITIVE COCCI RARE GRAM NEGATIVE RODS Performed at [facility], [address],  [place], [place] [address]    Culture PENDING  Incomplete   Report Status PENDING  Incomplete    Anti-infectives:  Anti-infectives (From admission, onward)    Start     Dose/Rate Route Frequency Ordered Stop   04/15/24 0615  Ampicillin-Sulbactam (UNASYN) 3 g in sodium chloride  0.9 % 100 mL IVPB        3 g 200 mL/hr over 30 Minutes Intravenous Every 6 hours 04/15/24 0529     04/11/24 1600  ceFAZolin (ANCEF) 2-4 GM/100ML-% IVPB       Note to Pharmacy: Atanacio Race: cabinet override      04/11/24 1600 04/12/24 0414     Consults: Treatment Team:  Md, Trauma, MD Lanis Pupa, MD    Studies:    Events:  Subjective:    Overnight Issues: about to get echo  Objective:  Vital signs for last 24 hours: Temp:  [97.6 F (36.4 C)-98.2 F (36.8 C)] 97.6 F (36.4 C) (11/17 0800) Pulse Rate:  [56-84] 69 (11/17 0900) Resp:  [11-24] 18 (11/17 0900) BP: (104-151)/(47-64) 121/57 (11/17 0900) SpO2:  [92 %-100 %] 96 % (11/17 0900) Arterial Line BP: (88-184)/(42-64) 137/43 (11/17 0930) FiO2 (%):  [40 %-50 %] 40 % (11/17 0810) Weight:  [68.3 kg] 68.3 kg (11/17 0500)  Hemodynamic parameters for last 24 hours: CVP:  [3 mmHg-14 mmHg] 5 mmHg  Intake/Output from previous day: 11/16 0701 - 11/17 0700 In: 2295.4 [I.V.:1046; IV Piggyback:1249.5] Out: 1405 [Urine:1405]  Intake/Output this shift: Total I/O In: -  Out: 87 [Urine:87]  Vent settings for last 24 hours: Vent Mode: PRVC FiO2 (%):  [40 %-50 %] 40 % Set Rate:  [16 bmp] 16 bmp Vt Set:  [440 mL] 440 mL PEEP:  [8 cmH20] 8 cmH20 Plateau Pressure:  [14 cmH20-19 cmH20] 19 cmH20  Physical Exam:  General: awake on vent Neuro: F/C UE and LE HEENT/Neck: ETT Resp: clear to auscultation bilaterally CVS: RRR GI: soft, NT Ext calves soft  Results for orders placed or performed during the hospital encounter of 04/09/24 (from the past 24 hours)  Culture, Respiratory w Gram Stain     Status: None (Preliminary result)   Collection Time: 04/15/24 10:41 AM   Specimen: Tracheal Aspirate; Respiratory  Result Value Ref Range   Specimen Description TRACHEAL ASPIRATE    Special Requests NONE    Gram Stain      NO WBC SEEN FEW GRAM POSITIVE COCCI RARE GRAM NEGATIVE RODS Performed at [facility]  Lab, [address] [place], [place] [address]    Culture PENDING    Report Status PENDING   Glucose, capillary     Status: Abnormal   Collection Time: 04/15/24 11:22 AM  Result Value Ref Range   Glucose-Capillary 116 (H) 70 - 99 mg/dL  Basic metabolic panel with GFR     Status: Abnormal   Collection Time: 04/15/24  2:24 PM  Result Value Ref Range   Sodium 144 135 - 145 mmol/L   Potassium 3.3 (L) 3.5 - 5.1 mmol/L   Chloride 110 98 - 111 mmol/L   CO2 24 22 - 32 mmol/L   Glucose, Bld 102 (H) 70 - 99 mg/dL   BUN 24 (H) 8 - 23 mg/dL   Creatinine, Ser 9.59 (L) 0.44 - 1.00 mg/dL   Calcium 8.3 (L) 8.9 - 10.3 mg/dL   GFR, Estimated >39 >39 mL/min   Anion gap 10 5 - 15  Glucose, capillary     Status: Abnormal   Collection  Time: 04/15/24  3:36 PM  Result Value Ref Range   Glucose-Capillary 106 (H) 70 - 99 mg/dL  Glucose, capillary     Status: Abnormal   Collection Time: 04/15/24  7:47 PM  Result Value Ref Range   Glucose-Capillary 122 (H) 70 - 99 mg/dL  Glucose, capillary     Status: Abnormal   Collection Time: 04/15/24 11:34 PM  Result Value Ref Range   Glucose-Capillary 115 (H) 70 - 99 mg/dL  Glucose, capillary     Status: Abnormal   Collection Time: 04/16/24  3:28 AM  Result Value Ref Range   Glucose-Capillary 113 (H) 70 - 99 mg/dL  Magnesium     Status: None   Collection Time: 04/16/24  5:00 AM  Result Value Ref Range   Magnesium 2.3 1.7 - 2.4 mg/dL  Phosphorus     Status: None   Collection Time: 04/16/24  5:00 AM  Result Value Ref Range   Phosphorus 3.1 2.5 - 4.6 mg/dL  CBC     Status: Abnormal   Collection Time: 04/16/24  5:00 AM  Result Value Ref Range   WBC 8.8 4.0 - 10.5 K/uL   RBC 2.44 (L) 3.87 - 5.11 MIL/uL   Hemoglobin 7.1 (L) 12.0 - 15.0 g/dL   HCT 78.3 (L) 63.9 - 53.9 %   MCV 88.5 80.0 - 100.0 fL   MCH 29.1 26.0 - 34.0 pg   MCHC 32.9 30.0 - 36.0 g/dL   RDW 84.1 (H) 88.4 - 84.4 %   Platelets 226 150 - 400 K/uL   nRBC 0.3 (H) 0.0 - 0.2 %  Basic metabolic panel  with GFR     Status: Abnormal   Collection Time: 04/16/24  5:00 AM  Result Value Ref Range   Sodium 139 135 - 145 mmol/L   Potassium 4.5 3.5 - 5.1 mmol/L   Chloride 106 98 - 111 mmol/L   CO2 24 22 - 32 mmol/L   Glucose, Bld 110 (H) 70 - 99 mg/dL   BUN 27 (H) 8 - 23 mg/dL   Creatinine, Ser 9.44 0.44 - 1.00 mg/dL   Calcium 8.2 (L) 8.9 - 10.3 mg/dL   GFR, Estimated >39 >39 mL/min   Anion gap 9 5 - 15  Triglycerides     Status: None   Collection Time: 04/16/24  5:00 AM  Result Value Ref Range   Triglycerides 140 <150 mg/dL  Glucose, capillary     Status: Abnormal   Collection Time: 04/16/24  7:27 AM  Result Value Ref Range   Glucose-Capillary 112 (H) 70 - 99 mg/dL    Assessment & Plan: Present on Admission:  SDH (subdural hematoma) (HCC)    LOS: 7 days   Additional comments:I reviewed the patient's new clinical lab test results. / 83 y/o F GLF R posterior scalp hematoma L subdural hematoma with 3mm midline shift - per Dr. Lanis, keppra, F/U CT H stable 11/10, neuro status change 11/12>>  repeat CTH with increased left SDH with increased midline shift and early uncal herniation. Patient moved to ICU for monitoring, Dr. Lanis notified and patient underwent L temporo-parietal craniotomy 11/12. On nicardipine gtt for BP control. Neuro changes overnight. Repeat CTH done. NSGY has seen. EEG noted. Exam much better today. Acute hypoxic ventilator dependent respiratory failure - extubated 11/11. Reintubated 11/16 for hypoxia on NRB. Resp cxs pending, on empiric unasyn 11/16 for possible aspiration. CCM managing - greatly appreciate assistance and anticipate weaning Afib/RVR - amio initiated, now sinus, CCM managing, ECHO  being done now HTN/freq PVCs - cleviprex, home med HLD DM - SSI R hand swelling - no fx on xray FEN - NPO, TF per cortrak; PICC 11/14 - lost peripheral access; cont thiamine to 500 q 8h. Na 141 from 142, trend. BMP tomorrow VTE - SCD's, chemical VTE held due to  intracranial hemorrhage ID - none; afebrile WBC 11.7, resp cultures ordered 11/12, UA 11/12 neg Foley- foley replaced 11/14 for evident retention Dispo - ICU, lasix x 1, begin vent weaning, appreciate Dr. Johnita help and I D/W him on the unit. Critical Care Total Time*: 34 Minutes  Dann Hummer, MD, MPH, FACS Trauma & General Surgery Use [email or web address] to contact on call provider  04/16/2024  *Care during the described time interval was provided by me. I have reviewed this patient's available data, including medical history, events of note, physical examination and test results as part of my evaluation.

## 2024-04-16 NOTE — Progress Notes (Signed)
 Inpatient Rehab Admissions Coordinator:  Continue to follow for medical readiness and progress with therapies.    Tinnie Yvone Cohens, MS, CCC-SLP Admissions Coordinator [phone]

## 2024-04-16 NOTE — Progress Notes (Signed)
 LTM EEG discontinued - no skin breakdown at [facility].

## 2024-04-17 LAB — BASIC METABOLIC PANEL WITH GFR
Anion gap: 11 (ref 5–15)
BUN: 20 mg/dL (ref 8–23)
CO2: 23 mmol/L (ref 22–32)
Calcium: 8.3 mg/dL — ABNORMAL LOW (ref 8.9–10.3)
Chloride: 106 mmol/L (ref 98–111)
Creatinine, Ser: 0.47 mg/dL (ref 0.44–1.00)
GFR, Estimated: >60 mL/min (ref >60)
Glucose, Bld: 126 mg/dL — ABNORMAL HIGH (ref 70–99)
Potassium: 3.2 mmol/L — ABNORMAL LOW (ref 3.5–5.1)
Sodium: 140 mmol/L (ref 135–145)

## 2024-04-17 LAB — CBC
HCT: 23.4 % — ABNORMAL LOW (ref 36.0–46.0)
Hemoglobin: 7.8 g/dL — ABNORMAL LOW (ref 12.0–15.0)
MCH: 28.8 pg (ref 26.0–34.0)
MCHC: 33.3 g/dL (ref 30.0–36.0)
MCV: 86.3 fL (ref 80.0–100.0)
Platelets: 277 K/uL (ref 150–400)
RBC: 2.71 MIL/uL — ABNORMAL LOW (ref 3.87–5.11)
RDW: 15.7 % — ABNORMAL HIGH (ref 11.5–15.5)
WBC: 11.4 K/uL — ABNORMAL HIGH (ref 4.0–10.5)
nRBC: 0.6 % — ABNORMAL HIGH (ref 0.0–0.2)

## 2024-04-17 LAB — GLUCOSE, CAPILLARY
Glucose-Capillary: 100 mg/dL — ABNORMAL HIGH (ref 70–99)
Glucose-Capillary: 121 mg/dL — ABNORMAL HIGH (ref 70–99)
Glucose-Capillary: 128 mg/dL — ABNORMAL HIGH (ref 70–99)
Glucose-Capillary: 129 mg/dL — ABNORMAL HIGH (ref 70–99)
Glucose-Capillary: 135 mg/dL — ABNORMAL HIGH (ref 70–99)
Glucose-Capillary: 164 mg/dL — ABNORMAL HIGH (ref 70–99)

## 2024-04-17 LAB — CULTURE, RESPIRATORY W GRAM STAIN
Culture: NORMAL
Gram Stain: NONE SEEN

## 2024-04-17 LAB — MAGNESIUM: Magnesium: 1.9 mg/dL (ref 1.7–2.4)

## 2024-04-17 LAB — TRIGLYCERIDES: Triglycerides: 98 mg/dL (ref <150)

## 2024-04-17 MED ORDER — FUROSEMIDE 10 MG/ML IJ SOLN
40.0000 mg | Freq: Once | INTRAMUSCULAR | Status: AC
  Administered 2024-04-17: 40 mg via INTRAVENOUS
  Filled 2024-04-17: qty 4

## 2024-04-17 MED ORDER — LOSARTAN POTASSIUM 50 MG PO TABS
100.0000 mg | ORAL_TABLET | Freq: Every day | ORAL | Status: DC
  Administered 2024-04-17 – 2024-04-22 (×6): 100 mg via ORAL
  Filled 2024-04-17 (×7): qty 2

## 2024-04-17 MED ORDER — POTASSIUM CHLORIDE 20 MEQ PO PACK
40.0000 meq | PACK | ORAL | Status: AC
  Administered 2024-04-17 (×2): 40 meq
  Filled 2024-04-17 (×2): qty 2

## 2024-04-17 MED ORDER — LEVETIRACETAM 500 MG PO TABS
500.0000 mg | ORAL_TABLET | Freq: Two times a day (BID) | ORAL | Status: DC
  Administered 2024-04-17 – 2024-04-22 (×11): 500 mg via ORAL
  Filled 2024-04-17 (×11): qty 1

## 2024-04-17 MED ORDER — CARVEDILOL 12.5 MG PO TABS
12.5000 mg | ORAL_TABLET | Freq: Two times a day (BID) | ORAL | Status: DC
  Administered 2024-04-17 – 2024-04-18 (×2): 12.5 mg via ORAL
  Filled 2024-04-17 (×2): qty 1

## 2024-04-17 MED ORDER — CARMEX CLASSIC LIP BALM EX OINT
TOPICAL_OINTMENT | CUTANEOUS | Status: DC | PRN
  Administered 2024-04-17: 1 via TOPICAL
  Filled 2024-04-17: qty 10

## 2024-04-17 MED ORDER — POTASSIUM CHLORIDE 10 MEQ/100ML IV SOLN
10.0000 meq | INTRAVENOUS | Status: AC
  Administered 2024-04-17 (×4): 10 meq via INTRAVENOUS
  Filled 2024-04-17 (×2): qty 100

## 2024-04-17 NOTE — Progress Notes (Signed)
  NEUROSURGERY PROGRESS NOTE   Pt seen and examined. No issues overnight. Sitting in bedside chair, no complaints.  EXAM: Temp:  [97.8 F (36.6 C)-100.8 F (38.2 C)] 97.8 F (36.6 C) (11/18 0800) Pulse Rate:  [69-106] 78 (11/18 1100) Resp:  [16-30] 23 (11/18 1100) BP: (123-161)/(49-87) 146/50 (11/18 1100) SpO2:  [90 %-99 %] 96 % (11/18 1100) Arterial Line BP: (141-175)/(40-58) 154/50 (11/18 1100) Weight:  [68.3 kg] 68.3 kg (11/18 0500) Intake/Output      11/17 0701 11/18 0700 11/18 0701 11/19 0700   I.V. (mL/kg) 733.4 (10.7) 38.1 (0.6)   IV Piggyback 454.8 271.3   Total Intake(mL/kg) 1188.2 (17.4) 309.4 (4.5)   Urine (mL/kg/hr) 3197 (2) 1850 (5.1)   Stool 0    Total Output 3197 1850   Net -2008.8 -1540.6        Stool Occurrence 1 x     Awake, alert Speech broken, naming intact. Poor repetition MAE good strength Wound c/d/I   LABS: Lab Results  Component Value Date   CREATININE 0.47 04/17/2024   BUN 20 04/17/2024   NA 140 04/17/2024   K 3.2 (L) 04/17/2024   CL 106 04/17/2024   CO2 23 04/17/2024   Lab Results  Component Value Date   WBC 11.4 (H) 04/17/2024   HGB 7.8 (L) 04/17/2024   HCT 23.4 (L) 04/17/2024   MCV 86.3 04/17/2024   PLT 277 04/17/2024     IMPRESSION: - 83 y.o. female POD# 6 s/p left crani for SDH, appears neurologically stable with mixed aphasia. EEG was negative - Pulmonary edema and associated VDRF, resolved - Afib w RVR resolved  PLAN: - Cont supportive care per trauma - Cont SLP therapy   Gerldine Maizes, MD [facility]

## 2024-04-17 NOTE — Progress Notes (Signed)
 Inpatient Rehab Admissions Coordinator:    CIR following, Not yet medically stable for CIR but will follow for potential admit once medically stable if insurance will approve.   Leita Kleine, MS, CCC-SLP Rehab Admissions Coordinator  [phone] (celll) [phone] (office)

## 2024-04-17 NOTE — Progress Notes (Addendum)
 Speech Language Pathology Treatment:    Patient Details Name: Jeanne Ellison MRN: [id number] DOB: [birth year] Today's Date: 04/17/2024 Time: 9076-9042 SLP Time Calculation (min) (ACUTE ONLY): 34 min  Assessment / Plan / Recommendation Clinical Impression  Cognition, speech and language reassessed since pt has had craniotomy and reintubation x2 since initial SLP assessment on 11/11. Pt alert, oriented x4 and independently locates calendar and clock on different sides of the room to orient herself. Pt follows commands, names body parts and familiar objects 10/10. Pt needed max cues for divergent naming and to extend responses to any open ended question beyond telegraphic 1-2 word statements. When pressed pt was able to verbalize complete, accurate sentences but repeatedly needed encouragement to do so. Son does report that this is different from her baseline. Pt does not follow multistep commands without cues to complete the task. When these deficits were drawn to patients attention she says stomachache. Pt may need SLP f/u at next level of care for language and working memory. Will f/u while admitted to further assess pts cognitive linguistic function when she feels better.    HPI HPI: Pt is an 83 year old female admitted after a ground level fall from bed to floor on 04/09/24. Suffered R posterior scalp hematoma and L subdural hematoma with 3 mm midline shift and uncal herniation, underwent L temporo-parietal craniotomy on 11/12. Intubated 11/10-11/11, but then again on 11/12 for procedure. Evaluated by SLP on 11/14 and kept NPO, but eventually intubated again on 11/16 for hypoxia until 11/17. Minimal PMH available in chart.      SLP Plan  Continue with current plan of care          Recommendations  Medication Administration: Whole meds with liquid Compensations: Slow rate;Small sips/bites                  Oral care BID     Dysphagia, unspecified (R13.10)     Continue with  current plan of care     Jeanne Ellison, Jeanne Ellison  04/17/2024, 10:16 AM

## 2024-04-17 NOTE — Plan of Care (Signed)

## 2024-04-17 NOTE — Progress Notes (Signed)
 NAME:  Jeanne Ellison, MRN:  [id number], DOB:  [birth year], LOS: 8 ADMISSION DATE:  04/09/2024, CONSULTATION DATE:  11/16 REFERRING MD:  Dr. Dasie, CHIEF COMPLAINT:  trauma; hypoxic respiratory failure   History of Present Illness:  Patient is a 83 yo F w/ pertinent PMH T2DM, HTN, HLD presents to [facility] on 11/10 level 1 trauma.  On 11/10 patient found down at home after fall. Brought to [facility] ED. EMS noted patient altered and combative. Patient intubated for airway protection. Hypertenisve w/ sbp 210. CT head showing L SDH w/ 3mm shift. NSG consulted. Started on cleviprex and keppra. Extubated on 11/11. On 11/12 worsening ams. CT head w/ enlarging left SDH 11mm w/ 8mm MLS, early uncal herniation. NSG taken to OR for craniotomy.  On 11/16 patient had worsening hypoxia placed on NRB. Went into afib RVR started on amio. K low repleted. Patient intubated. CXR showing pulmonary edema. Hypotensive post intubation on low dose levo. Patient coverted back into sinus rhythm post intubation. PCCM consulted.  Pertinent  Medical History   Past Medical History:  Diagnosis Date   Diabetes mellitus without complication (HCC)    Type II   Hypertension      Significant Hospital Events: Including procedures, antibiotic start and stop dates in addition to other pertinent events   11/10 level 1 trauma; fall left SDH on clevi; intubated 11/11 extubated 11/12 enlarging sdh taken to OR for crani 11/16 worsening hypoxia and afib rvr; intubated; pccm consulted 11/17 sedation was turned off, patient tolerated spontaneous breathing trial, was extubated successfully  Interim History / Subjective:  Patient spiked fever with Tmax 100.8 this morning, now afebrile Stated feeling better Currently on nasal cannula oxygen Denies any other complaints Remain on clevidipine infusion at 8 mg/h Objective    Blood pressure (!) 161/59, pulse 73, temperature 97.8 F (36.6 C), temperature source Axillary, resp. rate 18,  height 5' 5.5 (1.664 m), weight 68.3 kg, SpO2 94%. CVP:  [5 mmHg-13 mmHg] 10 mmHg      Intake/Output Summary (Last 24 hours) at 04/17/2024 0850 Last data filed at 04/17/2024 0800 Gross per 24 hour  Intake 1281.56 ml  Output 3160 ml  Net -1878.44 ml   Filed Weights   04/15/24 0500 04/16/24 0500 04/17/24 0500  Weight: 65.6 kg 68.3 kg 68.3 kg    Examination: General: Acute on chronically ill-appearing elderly female, lying on the bed HEENT: Status post craniotomy, eyes anicteric.  moist mucus membranes. cortrak in place Neuro: Alert, awake following commands, moving all 4 extremities Chest: Coarse breath sounds, no wheezes or rhonchi Heart: Regular rate and rhythm, no murmurs or gallops Abdomen: Soft, nontender, nondistended, bowel sounds present   Labs and images reviewed  Patient Lines/Drains/Airways Status     Active Line/Drains/Airways     Name Placement date Placement time Site Days   Arterial Line 04/11/24 Right Radial 04/11/24  1550  Radial  5   Peripheral IV 04/15/24 20 G Anterior;Distal;Left Forearm 04/15/24  0430  Forearm  1   Peripheral IV 04/15/24 20 G Anterior;Left;Proximal Antecubital 04/15/24  1645  Antecubital  1   PICC Double Lumen 04/13/24 Right Basilic 37 cm 0 cm 04/13/24  8798  -- 3   Urethral Catheter Amy Brown Straight-tip 14 Fr. 04/13/24  1654  Straight-tip  3   Airway 7.5 mm 04/15/24  0332  -- 1   Small Bore Feeding Tube 10 Fr. Left nare Marking at nare/corner of mouth 62 cm 04/11/24  1127  Left nare  5  Wound 04/11/24 1909 Surgical Closed Surgical Incision Head 04/11/24  1909  Head  5        Resolved problem list   Assessment and Plan  Acute hypoxic respiratory failure Pulmonary edema in the setting of A-fib with RVR, improving Possible aspiration pneumonia Bilateral pleural effusion Successfully extubated yesterday, currently on 4 L nasal cannula oxygen Received 40 mg of Lasix yesterday with a net -2 L Will diurese again with 40 mg of  Lasix Remain in sinus rhythm Continue IV Unasyn to complete 7 days therapy Respiratory culture is growing mixed flora, follow-up sensitivities  R posterior scalp hematoma L subdural hematoma with 3mm midline shift Small left frontal/parietal subarachnoid hemorrhage, traumatic Patient underwent craniotomy and evacuation of hematoma Repeat head CT showed a residual left subdural hemorrhage with slight midline shift Continue Keppra for seizure prophylaxis Keep head and of the bed elevated to 45 degrees Continue PT/OT evaluation  Paroxysmal Afib RVR Hypokalemia due to aggressive diuresis Patient remained in sinus rhythm Off amiodarone On Coreg 12.5 mg twice daily, at home she is on bisoprolol Not a candidate for anticoagulation for now considering subdural hemorrhage and subarachnoid hemorrhage Continue aggressive electrolyte replacement  HTN HLD Continue statin Remain on clevidipine infusion at 8 mg/h Started on Coreg 12.5 mg twice daily, continue to titrate clevidipine  DMT2 Patient's hemoglobin A1c is 6 Blood sugars are controlled Continue sliding scale insulin with CBG goal 140-180  Anemia due to critical illness H&H is stable between 7-8 Closely monitor and transfuse if less than 7  Anxiety  Continue celexa on xanax as needed at home; currently on hold  Moderate malnutrition Continue tube feeds Speech and swallow evaluation   Labs   CBC: Recent Labs  Lab 04/14/24 0320 04/14/24 1133 04/15/24 0028 04/15/24 0544 04/15/24 0848 04/16/24 0500 04/17/24 0504  WBC 11.9*  --  11.7*  --  10.4 8.8 11.4*  HGB 7.3*   < > 8.2* 8.2* 7.8* 7.1* 7.8*  HCT 21.5*   < > 24.2* 24.0* 23.5* 21.6* 23.4*  MCV 86.0  --  85.5  --  87.0 88.5 86.3  PLT 174  --  228  --  214 226 277   < > = values in this interval not displayed.    Basic Metabolic Panel: Recent Labs  Lab 04/13/24 0427 04/13/24 2054 04/14/24 0320 04/14/24 1133 04/15/24 0028 04/15/24 0544 04/15/24 0848  04/15/24 1424 04/16/24 0500 04/17/24 0504  NA 142 140 142   < > 141 143 140 144 139 140  K 3.7 3.5 3.5   < > 3.0* 3.7 3.5 3.3* 4.5 3.2*  CL 110 107 108  --  107  --  107 110 106 106  CO2 22 23 22   --  22  --  24 24 24 23   GLUCOSE 162* 150* 118*  --  148*  --  150* 102* 110* 126*  BUN 18 20 21   --  20  --  20 24* 27* 20  CREATININE 0.36* 0.31* 0.39*  --  0.42*  --  0.46 0.40* 0.55 0.47  CALCIUM 8.9 8.7* 8.6*  --  8.6*  --  8.2* 8.3* 8.2* 8.3*  MG 2.1  --  1.9  --  2.1  --  2.0  --  2.3  --   PHOS <1.0* 3.1 2.6  --   --   --  2.9  --  3.1  --    < > = values in this interval not displayed.   GFR:  Estimated Creatinine Clearance: 49 mL/min (by C-G formula based on SCr of 0.47 mg/dL). Recent Labs  Lab 04/15/24 0028 04/15/24 0848 04/16/24 0500 04/17/24 0504  WBC 11.7* 10.4 8.8 11.4*    Liver Function Tests: No results for input(s): AST, ALT, ALKPHOS, BILITOT, PROT, ALBUMIN in the last 168 hours.  No results for input(s): LIPASE, AMYLASE in the last 168 hours. No results for input(s): AMMONIA in the last 168 hours.  ABG    Component Value Date/Time   PHART 7.481 (H) 04/15/2024 0544   PCO2ART 32.6 04/15/2024 0544   PO2ART 189 (H) 04/15/2024 0544   HCO3 24.5 04/15/2024 0544   TCO2 25 04/15/2024 0544   ACIDBASEDEF 1.0 04/09/2024 1040   O2SAT 100 04/15/2024 0544     Coagulation Profile: No results for input(s): INR, PROTIME in the last 168 hours.   Cardiac Enzymes: No results for input(s): CKTOTAL, CKMB, CKMBINDEX, TROPONINI in the last 168 hours.  HbA1C: Hgb A1c MFr Bld  Date/Time Value Ref Range Status  04/13/2024 12:53 AM 6.0 (H) 4.8 - 5.6 % Final    Comment:    (NOTE) Diagnosis of Diabetes The following HbA1c ranges recommended by the American Diabetes Association (ADA) may be used as an aid in the diagnosis of diabetes mellitus.  Hemoglobin             Suggested A1C NGSP%              Diagnosis  <5.7                   Non  Diabetic  5.7-6.4                Pre-Diabetic  >6.4                   Diabetic  <7.0                   Glycemic control for                       adults with diabetes.      CBG: Recent Labs  Lab 04/16/24 1550 04/16/24 1932 04/16/24 2317 04/17/24 0344 04/17/24 0733  GLUCAP 110* 130* 111* 128* 100*      Valinda Novas, MD [facility] See Amion for pager If no response to pager, please call [phone] until 7pm After 7pm, Please call E-link [phone]

## 2024-04-17 NOTE — Evaluation (Signed)
 Clinical/Bedside Swallow Evaluation Patient Details  Name: Jeanne Ellison MRN: [id number] Date of Birth: [birth year]  Today's Date: 04/17/2024 Time: SLP Start Time (ACUTE ONLY): 9076 SLP Stop Time (ACUTE ONLY): 0957 SLP Time Calculation (min) (ACUTE ONLY): 34 min  Past Medical History:  Past Medical History:  Diagnosis Date   Diabetes mellitus without complication (HCC)    Type II   Hypertension    Past Surgical History:  Past Surgical History:  Procedure Laterality Date   ABDOMINAL HYSTERECTOMY     APPENDECTOMY     CRANIOTOMY Left 04/11/2024   Procedure: CRANIOTOMY HEMATOMA EVACUATION SUBDURAL;  Surgeon: Lanis Pupa, MD;  Location: [facility] OR;  Service: Neurosurgery;  Laterality: Left;   HPI:  Pt is an 83 year old female admitted after a ground level fall from bed to floor on 04/09/24. Suffered R posterior scalp hematoma and L subdural hematoma with 3 mm midline shift and uncal herniation, underwent L temporo-parietal craniotomy on 11/12. Intubated 11/10-11/11, but then again on 11/12 for procedure. Evaluated by SLP on 11/14 and kept NPO, but eventually intubated again on 11/16 for hypoxia until 11/17. Minimal PMH available in chart.    Assessment / Plan / Recommendation  Clinical Impression  Pt demonstrates tolerance of thin liquids and solids, arousal adequate and no oral motor weakness appreciated. Pt did consistently report that she gets choked easily on water at baseline; that it happens occasionally at home. During assessment pt took very small sips and needed max encouragement to drink consecutively. She did not drink 3 oz consecutively but didnt exhibit significant risk factors for sensory loss. Vocal quality clear, cough strong after very first sip, but not present on any further trials. Pt did have some throat clearing during medication administration via cortrak. Pt to initiate a regular diet and thin liquids. SLP will f/u for tolerance but NG tube to be d/c'd per MD if  pt eating meals well. SLP Visit Diagnosis: Dysphagia, unspecified (R13.10)    Aspiration Risk  Mild aspiration risk    Diet Recommendation Regular;Thin liquid    Liquid Administration via: Cup;Straw Medication Administration: Whole meds with liquid Supervision: Patient able to self feed Compensations: Slow rate;Small sips/bites Postural Changes: Seated upright at 90 degrees    Other  Recommendations Oral Care Recommendations: Oral care BID     Assistance Recommended at Discharge    Functional Status Assessment Patient has had a recent decline in their functional status and demonstrates the ability to make significant improvements in function in a reasonable and predictable amount of time.  Frequency and Duration min 2x/week  1 week       Prognosis Prognosis for improved oropharyngeal function: Good      Swallow Study   General HPI: Pt is an 83 year old female admitted after a ground level fall from bed to floor on 04/09/24. Suffered R posterior scalp hematoma and L subdural hematoma with 3 mm midline shift and uncal herniation, underwent L temporo-parietal craniotomy on 11/12. Intubated 11/10-11/11, but then again on 11/12 for procedure. Evaluated by SLP on 11/14 and kept NPO, but eventually intubated again on 11/16 for hypoxia until 11/17. Minimal PMH available in chart. Type of Study: Bedside Swallow Evaluation Previous Swallow Assessment: 11/14 - NPO Diet Prior to this Study: NPO;Cortrak/Small bore NG tube Temperature Spikes Noted: No Respiratory Status: Nasal cannula History of Recent Intubation: Yes Total duration of intubation (days): 5 days Date extubated: 04/16/24 Behavior/Cognition: Alert;Cooperative;Pleasant mood Oral Cavity Assessment: Within Functional Limits Oral Care Completed by SLP:  Yes Oral Cavity - Dentition: Adequate natural dentition Vision: Functional for self-feeding Self-Feeding Abilities: Able to feed self Patient Positioning: Upright in  bed Baseline Vocal Quality: Normal Volitional Cough: Strong Volitional Swallow: Able to elicit    Oral/Motor/Sensory Function Overall Oral Motor/Sensory Function: Within functional limits   Ice Chips Ice chips: Within functional limits   Thin Liquid Thin Liquid: Impaired Presentation: Cup;Straw;Self Fed Pharyngeal  Phase Impairments: Cough - Immediate;Throat Clearing - Immediate    Nectar Thick Nectar Thick Liquid: Not tested   Honey Thick Honey Thick Liquid: Not tested   Puree Puree: Within functional limits   Solid     Solid: Within functional limits      Jeanne Ellison, Jeanne Ellison 04/17/2024,10:09 AM

## 2024-04-17 NOTE — Progress Notes (Signed)
 Patient ID: Jeanne Ellison, female   DOB: Mar 28, 1941, 83 y.o.   MRN: [id number] Follow up - Trauma Critical Care   Patient Details:    Jeanne Ellison is an 83 y.o. female.  Lines/tubes : PICC Double Lumen 04/13/24 Right Basilic 37 cm 0 cm (Active)  Indication for Insertion or Continuance of Line Prolonged intravenous therapies 04/17/24 0800  Exposed Catheter (cm) 0 cm 04/13/24 1201  Site Assessment Clean, Dry, Intact 04/17/24 0800  Lumen #1 Status Infusing 04/17/24 0800  Lumen #2 Status Saline locked 04/17/24 0800  Dressing Type Transparent 04/17/24 0800  Dressing Status Antimicrobial disc/dressing in place 04/17/24 0800  Line Care Connections checked and tightened 04/17/24 0800  Line Adjustment (NICU/IV Team Only) No 04/15/24 0800  Dressing Intervention New dressing 04/13/24 1201  Dressing Change Due 04/20/24 04/17/24 0800     Arterial Line 04/11/24 Right Radial (Active)  Site Assessment Clean, Dry, Intact 04/17/24 0800  Line Status Pulsatile blood flow 04/17/24 0800  Art Line Waveform Appropriate 04/17/24 0800  Art Line Interventions Zeroed and calibrated;Leveled;Connections checked and tightened;Flushed per protocol 04/17/24 0800  Color/Movement/Sensation Capillary refill less than 3 sec 04/17/24 0800  Dressing Type Transparent 04/17/24 0800  Dressing Status Clean, Dry, Intact;Antimicrobial disc in place 04/17/24 0800  Dressing Change Due 04/18/24 04/17/24 0800     Urethral Catheter Amy Delores Straight-tip 14 Fr. (Active)  Indication for Insertion or Continuance of Catheter Acute urinary retention (I&O Cath for 24 hrs prior to catheter insertion- Inpatient Only) 04/17/24 0800  Site Assessment Clean, Dry, Intact 04/17/24 0800  Catheter Maintenance Bag below level of bladder;Catheter secured;Drainage bag/tubing not touching floor;Insertion date on drainage bag;No dependent loops 04/17/24 0800  Collection Container Standard drainage bag 04/17/24 0800  Securement Method  Adhesive securement device 04/17/24 0800  Urinary Catheter Interventions (if applicable) Unclamped 04/17/24 0800  Input (mL) 0 mL 04/13/24 2300  Output (mL) 700 mL 04/17/24 0600    Microbiology/Sepsis markers: Results for orders placed or performed during the hospital encounter of 04/09/24  MRSA Next Gen by PCR, Nasal     Status: None   Collection Time: 04/09/24 12:21 PM   Specimen: Nasal Mucosa; Nasal Swab  Result Value Ref Range Status   MRSA by PCR Next Gen NOT DETECTED NOT DETECTED Final    Comment: (NOTE) The GeneXpert MRSA Assay (FDA approved for NASAL specimens only), is one component of a comprehensive MRSA colonization surveillance program. It is not intended to diagnose MRSA infection nor to guide or monitor treatment for MRSA infections. Test performance is not FDA approved in patients less than 59 years old. Performed at [facility], [address], [place], [place] [address]   Culture, Respiratory w Gram Stain     Status: None (Preliminary result)   Collection Time: 04/15/24 10:41 AM   Specimen: Tracheal Aspirate; Respiratory  Result Value Ref Range Status   Specimen Description TRACHEAL ASPIRATE  Final   Special Requests NONE  Final   Gram Stain   Final    NO WBC SEEN FEW GRAM POSITIVE COCCI RARE GRAM NEGATIVE RODS    Culture   Final    CULTURE REINCUBATED FOR BETTER GROWTH Performed at [facility], [address] [place], [place] [address]    Report Status PENDING  Incomplete    Anti-infectives:  Anti-infectives (From admission, onward)    Start     Dose/Rate Route Frequency Ordered Stop   04/15/24 0615  Ampicillin-Sulbactam (UNASYN) 3 g in sodium chloride 0.9 %  100 mL IVPB        3 g 200 mL/hr over 30 Minutes Intravenous Every 6 hours 04/15/24 0529     04/11/24 1600  ceFAZolin (ANCEF) 2-4 GM/100ML-% IVPB       Note to Pharmacy: Atanacio Race: cabinet override      04/11/24 1600 04/12/24 0414     Consults: Treatment Team:  Md,  Trauma, MD Lanis Pupa, MD    Studies:    Events:  Subjective:    Overnight Issues: stayed extubated  Objective:  Vital signs for last 24 hours: Temp:  [97.8 F (36.6 C)-100.8 F (38.2 C)] 97.8 F (36.6 C) (11/18 0800) Pulse Rate:  [69-106] 81 (11/18 0900) Resp:  [11-30] 23 (11/18 0900) BP: (123-161)/(43-87) 141/65 (11/18 0900) SpO2:  [90 %-99 %] 94 % (11/18 0900) Arterial Line BP: (128-175)/(40-58) 151/46 (11/18 0900) Weight:  [68.3 kg] 68.3 kg (11/18 0500)  Hemodynamic parameters for last 24 hours: CVP:  [5 mmHg-13 mmHg] 10 mmHg  Intake/Output from previous day: 11/17 0701 - 11/18 0700 In: 1188.2 [I.V.:733.4; IV Piggyback:454.8] Out: 3197 [Urine:3197]  Intake/Output this shift: Total I/O In: 93.3 [I.V.:8; IV Piggyback:85.3] Out: -   Vent settings for last 24 hours:    Physical Exam:  General: alert and no respiratory distress Neuro: awake, oriented to year and place, F/C well Resp: clear to auscultation bilaterally CVS: RRR 70s GI: soft, NT, ND Extremities: calves soft  Results for orders placed or performed during the hospital encounter of 04/09/24 (from the past 24 hours)  Glucose, capillary     Status: Abnormal   Collection Time: 04/16/24 12:03 PM  Result Value Ref Range   Glucose-Capillary 168 (H) 70 - 99 mg/dL  Glucose, capillary     Status: Abnormal   Collection Time: 04/16/24  3:50 PM  Result Value Ref Range   Glucose-Capillary 110 (H) 70 - 99 mg/dL  Glucose, capillary     Status: Abnormal   Collection Time: 04/16/24  7:32 PM  Result Value Ref Range   Glucose-Capillary 130 (H) 70 - 99 mg/dL  Glucose, capillary     Status: Abnormal   Collection Time: 04/16/24 11:17 PM  Result Value Ref Range   Glucose-Capillary 111 (H) 70 - 99 mg/dL  Glucose, capillary     Status: Abnormal   Collection Time: 04/17/24  3:44 AM  Result Value Ref Range   Glucose-Capillary 128 (H) 70 - 99 mg/dL  CBC     Status: Abnormal   Collection Time: 04/17/24   5:04 AM  Result Value Ref Range   WBC 11.4 (H) 4.0 - 10.5 K/uL   RBC 2.71 (L) 3.87 - 5.11 MIL/uL   Hemoglobin 7.8 (L) 12.0 - 15.0 g/dL   HCT 76.5 (L) 63.9 - 53.9 %   MCV 86.3 80.0 - 100.0 fL   MCH 28.8 26.0 - 34.0 pg   MCHC 33.3 30.0 - 36.0 g/dL   RDW 84.2 (H) 88.4 - 84.4 %   Platelets 277 150 - 400 K/uL   nRBC 0.6 (H) 0.0 - 0.2 %  Basic metabolic panel with GFR     Status: Abnormal   Collection Time: 04/17/24  5:04 AM  Result Value Ref Range   Sodium 140 135 - 145 mmol/L   Potassium 3.2 (L) 3.5 - 5.1 mmol/L   Chloride 106 98 - 111 mmol/L   CO2 23 22 - 32 mmol/L   Glucose, Bld 126 (H) 70 - 99 mg/dL   BUN 20 8 - 23 mg/dL  Creatinine, Ser 0.47 0.44 - 1.00 mg/dL   Calcium 8.3 (L) 8.9 - 10.3 mg/dL   GFR, Estimated >39 >39 mL/min   Anion gap 11 5 - 15  Triglycerides     Status: None   Collection Time: 04/17/24  5:04 AM  Result Value Ref Range   Triglycerides 98 <150 mg/dL  Magnesium     Status: None   Collection Time: 04/17/24  5:04 AM  Result Value Ref Range   Magnesium 1.9 1.7 - 2.4 mg/dL  Glucose, capillary     Status: Abnormal   Collection Time: 04/17/24  7:33 AM  Result Value Ref Range   Glucose-Capillary 100 (H) 70 - 99 mg/dL    Assessment & Plan: Present on Admission:  SDH (subdural hematoma) (HCC)    LOS: 8 days   Additional comments:I reviewed the patient's new clinical lab test results. / 83 y/o F GLF R posterior scalp hematoma L subdural hematoma with 3mm midline shift - per Dr. Lanis, caffie, F/U CT H stable 11/10, neuro status change 11/12>>  repeat CTH with increased left SDH with increased midline shift and early uncal herniation. Patient moved to ICU for monitoring, Dr. Lanis notified and patient underwent L temporo-parietal craniotomy 11/12. On nicardipine gtt for BP control. Neuro changes overnight. Repeat CTH done. NSGY has seen. EEG noted. Exam much better. TBI team therapies. Acute hypoxic respiratory failure - extubated 11/11. Reintubated  11/16 for hypoxia on NRB. Resp cxs pending, on empiric unasyn 11/16 for possible aspiration. CCM managing - greatly appreciate assistance and anticipate weaning Afib/RVR - back in sinus, echo done HTN/freq PVCs - added Coreg to wean cleviprex HLD DM - SSI R hand swelling - no fx on xray FEN - NPO, TF per cortrak; PICC 11/14 - lost peripheral access; cont thiamine to 500 q 8h. Na 141 from 142, trend. BMP tomorrow VTE - SCD's, chemical VTE held due to intracranial hemorrhage ID - none Foley- foley replaced 11/14 for evident retention Dispo - ICU, work to get cleviprex off, appreciate Dr. Johnita help and I D/W him on the unit. Critical Care Total Time*: 33 Minutes  Dann Hummer, MD, MPH, FACS Trauma & General Surgery Use [email or web address] to contact on call provider  04/17/2024  *Care during the described time interval was provided by me. I have reviewed this patient's available data, including medical history, events of note, physical examination and test results as part of my evaluation.

## 2024-04-17 NOTE — Progress Notes (Signed)
 Physical Therapy Treatment Patient Details Name: Jeanne Ellison MRN: [id number] DOB: 1940/12/23 Today's Date: 04/17/2024   History of Present Illness 83 yo female presents [facility] 11/10 s/p fall with AMS. Imaging shows 6 mm L SDH with 3 mm midline shift. ETT 11/10-11/11. 11/12 neuro status change, repeat CT revealed increased left SDH with increased midline shift and early uncal herniation. L temporo-parietal craniotomy 11/12. CT chest, abdomen, pelvis; and CT cervical spine are negative for acute injury. EEG negative 11/14. 11/16 worsening hypoxia and afib rvr; intubated 11/14-11/17. PMH DM.    PT Comments  Pt pleasant, conversant upon arrival to room. Pt indicating abdominal pain, when assessed pt heavily soiled in stool with no awareness of this. Pt requiring clean up assist +2. Pt overall requiring min-mod +2 physical assist for bed mobility and repeated transfers, unable to progress to gait given LE weakness and pt endorsing dizziness with standing activity VSS. Pt following commands during session, but does benefit from multimodal cuing. Plan for d/c to intensive inpatient rehabilitation remains appropriate.     If plan is discharge home, recommend the following: A lot of help with walking and/or transfers;A lot of help with bathing/dressing/bathroom   Can travel by private vehicle        Equipment Recommendations  None recommended by PT    Recommendations for Other Services       Precautions / Restrictions Precautions Precautions: Fall Recall of Precautions/Restrictions: Impaired Precaution/Restrictions Comments: SBP <150, A line, RIJ PICC Restrictions Weight Bearing Restrictions Per Provider Order: No     Mobility  Bed Mobility Overal bed mobility: Needs Assistance Bed Mobility: Rolling, Sidelying to Sit Rolling: Min assist   Supine to sit: Mod assist     General bed mobility comments: assist for completion of log roll bilat during pericare, trunk elevation and LE  management to get to EOB.    Transfers Overall transfer level: Needs assistance Equipment used: 2 person hand held assist Transfers: Sit to/from Stand, Bed to chair/wheelchair/BSC Sit to Stand: Mod assist, +2 physical assistance, +2 safety/equipment   Step pivot transfers: Mod assist, +2 physical assistance, +2 safety/equipment       General transfer comment: assist for rise, steady, and pivotal steps to reach recliner on pt's L. stand from EOB, limited by dizziness but VSS    Ambulation/Gait         Gait velocity: decr     General Gait Details: unable to attempt given dizziness, fatigue   Stairs             Wheelchair Mobility     Tilt Bed    Modified Rankin (Stroke Patients Only)       Balance Overall balance assessment: Needs assistance Sitting-balance support: No upper extremity supported, Feet supported Sitting balance-Leahy Scale: Fair Sitting balance - Comments: CGA   Standing balance support: Bilateral upper extremity supported, During functional activity Standing balance-Leahy Scale: Poor Standing balance comment: reliant on bilat UE Support of PT/OT                            Communication Communication Communication: Impaired Factors Affecting Communication: Reduced clarity of speech;Difficulty expressing self  Cognition Arousal: Alert Behavior During Therapy: Restless, Impulsive   PT - Cognitive impairments: Difficult to assess, Sequencing, Problem solving, Safety/Judgement, Attention, Awareness                         Following commands: Impaired Following  commands impaired: Follows one step commands with increased time, Follows one step commands inconsistently    Cueing Cueing Techniques: Verbal cues, Gestural cues  Exercises      General Comments General comments (skin integrity, edema, etc.): 4LO2      Pertinent Vitals/Pain Pain Assessment Pain Assessment: Faces Faces Pain Scale: Hurts little  more Pain Location: stomach Pain Descriptors / Indicators: Grimacing Pain Intervention(s): Limited activity within patient's tolerance, Monitored during session, Repositioned    Home Living                          Prior Function            PT Goals (current goals can now be found in the care plan section) Acute Rehab PT Goals PT Goal Formulation: With patient Time For Goal Achievement: 04/24/24 Potential to Achieve Goals: Good Progress towards PT goals: Progressing toward goals    Frequency    Min 3X/week      PT Plan      Co-evaluation PT/OT/SLP Co-Evaluation/Treatment: Yes Reason for Co-Treatment: Complexity of the patient's impairments (multi-system involvement);For patient/therapist safety;To address functional/ADL transfers PT goals addressed during session: Mobility/safety with mobility;Balance OT goals addressed during session: ADL's and self-care      AM-PAC PT 6 Clicks Mobility   Outcome Measure  Help needed turning from your back to your side while in a flat bed without using bedrails?: A Lot Help needed moving from lying on your back to sitting on the side of a flat bed without using bedrails?: A Lot Help needed moving to and from a bed to a chair (including a wheelchair)?: A Lot Help needed standing up from a chair using your arms (e.g., wheelchair or bedside chair)?: A Lot Help needed to walk in hospital room?: Total Help needed climbing 3-5 steps with a railing? : Total 6 Click Score: 10    End of Session Equipment Utilized During Treatment: Oxygen;Gait belt Activity Tolerance: Patient tolerated treatment well;Patient limited by fatigue Patient left: with call bell/phone within reach;with family/visitor present;in chair;with chair alarm set Nurse Communication: Mobility status PT Visit Diagnosis: Other abnormalities of gait and mobility (R26.89);Muscle weakness (generalized) (M62.81)     Time: 8864-8794 PT Time Calculation (min)  (ACUTE ONLY): 30 min  Charges:    $Therapeutic Activity: 8-22 mins PT General Charges $$ ACUTE PT VISIT: 1 Visit                     Jeanne Ellison, PT DPT Acute Rehabilitation Services Secure Chat Preferred  Office [phone]    Jeanne Ellison 04/17/2024, 2:47 PM

## 2024-04-17 NOTE — TOC Progression Note (Signed)
 Transition of Care [facility]) - Progression Note    Patient Details  Name: Jeanne Ellison MRN: [id number] Date of Birth: [birth year]  [facility] CM/SW Contact  Devra Stare E Gaylon Bentz, LCSW Phone Number: 04/17/2024, 8:53 AM  Clinical Narrative:    ICM continues to follow, noted plan for CIR when ready.     Barriers to Discharge: Continued Medical Work up               Expected Discharge Plan and Services       Living arrangements for the past 2 months: Single Family Home                                       Social Drivers of Health (SDOH) Interventions SDOH Screenings   Tobacco Use: Low Risk  (04/11/2024)    Readmission Risk Interventions     No data to display

## 2024-04-17 NOTE — Progress Notes (Signed)
 Occupational Therapy Treatment Patient Details Name: Jeanne Ellison MRN: [id number] DOB: 01-18-1941 Today's Date: 04/17/2024   History of present illness 83 yo female presents to [facility] 11/10 s/p fall with AMS. Imaging shows 6 mm L SDH with 3 mm midline shift. ETT 11/10-11/11. 11/12 neuro status change, repeat CT revealed increased left SDH with increased midline shift and early uncal herniation. L temporo-parietal craniotomy 11/12. CT chest, abdomen, pelvis; and CT cervical spine are negative for acute injury. EEG negative 11/14. 11/16 worsening hypoxia and afib rvr; intubated 11/14-11/17. PMH DM.   OT comments  Pt is making steady progress towards their acute OT goals. Pt continues to benefit from OT/PT +2 for safety and line management. On arrival she was soiled and unaware, she needed total A +2 for bathing/peri care at bed level and min A for rolling L&R. Once sitting, she needed mod A +2 for simple transfers. Pt was unable to ambulate this date due to report of dizziness. VSS on 4L. OT to continue to follow acutely to facilitate progress towards established goals. Pt will continue to benefit from intensive inpatient follow up therapy, >3 hours/day after discharge.        If plan is discharge home, recommend the following:  A lot of help with walking and/or transfers;A lot of help with bathing/dressing/bathroom;Assistance with cooking/housework;Direct supervision/assist for medications management;Direct supervision/assist for financial management;Assist for transportation;Help with stairs or ramp for entrance;Supervision due to cognitive status   Equipment Recommendations  None recommended by OT    Recommendations for Other Services Rehab consult    Precautions / Restrictions Precautions Precautions: Fall Precaution/Restrictions Comments: SBP <150, A line, RIJ PICC Restrictions Weight Bearing Restrictions Per Provider Order: No       Mobility Bed Mobility Overal bed mobility:  Needs Assistance Bed Mobility: Rolling, Supine to Sit, Sit to Supine Rolling: Min assist   Supine to sit: Mod assist Sit to supine: Min assist   General bed mobility comments: +2 required for safety and line mgmt    Transfers Overall transfer level: Needs assistance Equipment used: 2 person hand held assist Transfers: Sit to/from Stand, Bed to chair/wheelchair/BSC Sit to Stand: Mod assist, +2 physical assistance, +2 safety/equipment     Step pivot transfers: Mod assist, +2 physical assistance, +2 safety/equipment     General transfer comment: pt immediately attempting to sit after standing due to dizziness, VSS     Balance Overall balance assessment: Needs assistance Sitting-balance support: No upper extremity supported, Feet supported Sitting balance-Leahy Scale: Fair Sitting balance - Comments: CGA   Standing balance support: Bilateral upper extremity supported, During functional activity Standing balance-Leahy Scale: Poor                             ADL either performed or assessed with clinical judgement   ADL Overall ADL's : Needs assistance/impaired                         Toilet Transfer: Moderate assistance;+2 for physical assistance;+2 for safety/equipment Toilet Transfer Details (indicate cue type and reason): pivotal stepping only, pt complained of dizziness on standing Toileting- Clothing Manipulation and Hygiene: Total assistance;+2 for physical assistance;+2 for safety/equipment Toileting - Clothing Manipulation Details (indicate cue type and reason): bed level     Functional mobility during ADLs: Moderate assistance;+2 for physical assistance;+2 for safety/equipment General ADL Comments: pt was soiled on arrival total A+2 needed for LB bathing at bed  level. mod A +2 required of OOB mobility    Extremity/Trunk Assessment Upper Extremity Assessment Upper Extremity Assessment: RUE deficits/detail;LUE deficits/detail RUE Deficits /  Details: used functionally for tasks assessed LUE Deficits / Details: using functionally for mobility, sitting balance and adjusting blanket. unable to formally assess   Lower Extremity Assessment Lower Extremity Assessment: Generalized weakness        Vision   Vision Assessment?: Vision impaired- to be further tested in functional context Additional Comments: seemingly [facility]   Perception Perception Perception: Not tested   Praxis Praxis Praxis: Not tested   Communication Communication Communication: Impaired Factors Affecting Communication: Reduced clarity of speech;Difficulty expressing self   Cognition Arousal: Alert Behavior During Therapy: Restless, Impulsive Cognition: Cognition impaired, Difficult to assess Difficult to assess due to: Impaired communication Orientation impairments: Situation Awareness: Intellectual awareness impaired, Online awareness impaired Memory impairment (select all impairments): Declarative long-term memory, Non-declarative long-term memory, Working memory Attention impairment (select first level of impairment): Selective attention Executive functioning impairment (select all impairments): Organization, Problem solving, Reasoning OT - Cognition Comments: cognitition was improved this date however still impaired. she required simple 1 step commands for all tasks. pt was soiled on arrival and unaware                 Following commands: Impaired Following commands impaired: Follows one step commands with increased time, Follows one step commands inconsistently      Cueing   Cueing Techniques: Verbal cues, Gestural cues        General Comments VSS 4L O2    Pertinent Vitals/ Pain       Pain Assessment Pain Assessment: Faces Faces Pain Scale: Hurts a little bit Pain Location: stomach Pain Descriptors / Indicators: Grimacing Pain Intervention(s): Limited activity within patient's tolerance, Monitored during session  Home Living      Available Help at Discharge: Family;Available 24 hours/day Type of Home: House                              Lives With: Spouse    Prior Functioning/Environment              Frequency  Min 2X/week        Progress Toward Goals  OT Goals(current goals can now be found in the care plan section)  Progress towards OT goals: Progressing toward goals  Acute Rehab OT Goals Patient Stated Goal: to sit OT Goal Formulation: With patient Time For Goal Achievement: 04/27/24 Potential to Achieve Goals: Good ADL Goals Pt Will Perform Grooming: with set-up;sitting Pt Will Perform Upper Body Dressing: with set-up;sitting Pt Will Perform Lower Body Dressing: with min assist;sit to/from stand Pt Will Transfer to Toilet: with contact guard assist;ambulating Additional ADL Goal #1: Pt will follow simple 1 step commands 100% of attempts  Plan      Co-evaluation    PT/OT/SLP Co-Evaluation/Treatment: Yes Reason for Co-Treatment: Complexity of the patient's impairments (multi-system involvement);For patient/therapist safety;To address functional/ADL transfers   OT goals addressed during session: ADL's and self-care      AM-PAC OT 6 Clicks Daily Activity     Outcome Measure   Help from another person eating meals?: A Lot Help from another person taking care of personal grooming?: A Little Help from another person toileting, which includes using toliet, bedpan, or urinal?: Total Help from another person bathing (including washing, rinsing, drying)?: A Lot Help from another person to put on and taking off regular upper  body clothing?: A Lot Help from another person to put on and taking off regular lower body clothing?: A Lot 6 Click Score: 12    End of Session Equipment Utilized During Treatment: Gait belt  OT Visit Diagnosis: Unsteadiness on feet (R26.81);Other abnormalities of gait and mobility (R26.89);Muscle weakness (generalized) (M62.81);Cognitive communication  deficit (R41.841) Symptoms and signs involving cognitive functions: Nontraumatic intracerebral hemorrhage   Activity Tolerance Patient tolerated treatment well   Patient Left in chair;with call bell/phone within reach;with chair alarm set   Nurse Communication Mobility status        Time: 8864-8791 OT Time Calculation (min): 33 min  Charges: OT General Charges $OT Visit: 1 Visit OT Treatments $Self Care/Home Management : 8-22 mins  Lucie Kendall, OTR/L Acute Rehabilitation Services Office [phone] Secure Chat Communication Preferred   [name] 04/17/2024, 1:11 PM

## 2024-04-18 LAB — BASIC METABOLIC PANEL WITH GFR
Anion gap: 12 (ref 5–15)
BUN: 11 mg/dL (ref 8–23)
CO2: 24 mmol/L (ref 22–32)
Calcium: 8.7 mg/dL — ABNORMAL LOW (ref 8.9–10.3)
Chloride: 103 mmol/L (ref 98–111)
Creatinine, Ser: 0.36 mg/dL — ABNORMAL LOW (ref 0.44–1.00)
GFR, Estimated: >60 mL/min (ref >60)
Glucose, Bld: 117 mg/dL — ABNORMAL HIGH (ref 70–99)
Potassium: 3.4 mmol/L — ABNORMAL LOW (ref 3.5–5.1)
Sodium: 139 mmol/L (ref 135–145)

## 2024-04-18 LAB — GLUCOSE, CAPILLARY
Glucose-Capillary: 109 mg/dL — ABNORMAL HIGH (ref 70–99)
Glucose-Capillary: 117 mg/dL — ABNORMAL HIGH (ref 70–99)
Glucose-Capillary: 137 mg/dL — ABNORMAL HIGH (ref 70–99)
Glucose-Capillary: 156 mg/dL — ABNORMAL HIGH (ref 70–99)
Glucose-Capillary: 170 mg/dL — ABNORMAL HIGH (ref 70–99)
Glucose-Capillary: 172 mg/dL — ABNORMAL HIGH (ref 70–99)

## 2024-04-18 LAB — CBC
HCT: 25.8 % — ABNORMAL LOW (ref 36.0–46.0)
Hemoglobin: 8.6 g/dL — ABNORMAL LOW (ref 12.0–15.0)
MCH: 29.1 pg (ref 26.0–34.0)
MCHC: 33.3 g/dL (ref 30.0–36.0)
MCV: 87.2 fL (ref 80.0–100.0)
Platelets: 336 K/uL (ref 150–400)
RBC: 2.96 MIL/uL — ABNORMAL LOW (ref 3.87–5.11)
RDW: 15.9 % — ABNORMAL HIGH (ref 11.5–15.5)
WBC: 12.9 K/uL — ABNORMAL HIGH (ref 4.0–10.5)
nRBC: 0.2 % (ref 0.0–0.2)

## 2024-04-18 LAB — MAGNESIUM: Magnesium: 2 mg/dL (ref 1.7–2.4)

## 2024-04-18 MED ORDER — BOOST PLUS PO LIQD
237.0000 mL | Freq: Two times a day (BID) | ORAL | Status: DC
  Filled 2024-04-18: qty 237

## 2024-04-18 MED ORDER — POTASSIUM CHLORIDE CRYS ER 20 MEQ PO TBCR
40.0000 meq | EXTENDED_RELEASE_TABLET | Freq: Once | ORAL | Status: AC
  Administered 2024-04-18: 40 meq via ORAL
  Filled 2024-04-18: qty 2

## 2024-04-18 MED ORDER — ENSURE PLUS HIGH PROTEIN PO LIQD
237.0000 mL | Freq: Two times a day (BID) | ORAL | Status: DC
  Administered 2024-04-18 – 2024-04-20 (×4): 237 mL via ORAL

## 2024-04-18 MED ORDER — ROSUVASTATIN CALCIUM 5 MG PO TABS
10.0000 mg | ORAL_TABLET | Freq: Every day | ORAL | Status: DC
  Administered 2024-04-18 – 2024-04-21 (×4): 10 mg via ORAL
  Filled 2024-04-18 (×4): qty 2

## 2024-04-18 MED ORDER — CITALOPRAM HYDROBROMIDE 20 MG PO TABS
20.0000 mg | ORAL_TABLET | Freq: Every day | ORAL | Status: DC
  Administered 2024-04-19 – 2024-04-22 (×4): 20 mg via ORAL
  Filled 2024-04-18: qty 2
  Filled 2024-04-18 (×3): qty 1

## 2024-04-18 MED ORDER — CARVEDILOL 25 MG PO TABS
25.0000 mg | ORAL_TABLET | Freq: Two times a day (BID) | ORAL | Status: DC
  Administered 2024-04-18 – 2024-04-22 (×9): 25 mg via ORAL
  Filled 2024-04-18: qty 2
  Filled 2024-04-18: qty 1
  Filled 2024-04-18 (×2): qty 2
  Filled 2024-04-18 (×5): qty 1

## 2024-04-18 MED ORDER — OSMOLITE 1.5 CAL PO LIQD
900.0000 mL | ORAL | Status: DC
  Administered 2024-04-18 – 2024-04-19 (×2): 900 mL
  Filled 2024-04-18 (×5): qty 948

## 2024-04-18 MED ORDER — POLYETHYLENE GLYCOL 3350 17 G PO PACK
17.0000 g | PACK | Freq: Every day | ORAL | Status: DC
  Filled 2024-04-18: qty 1

## 2024-04-18 MED ORDER — DOCUSATE SODIUM 50 MG/5ML PO LIQD
100.0000 mg | Freq: Two times a day (BID) | ORAL | Status: DC
  Administered 2024-04-20: 100 mg via ORAL
  Filled 2024-04-18 (×4): qty 10

## 2024-04-18 MED ORDER — THIAMINE MONONITRATE 100 MG PO TABS
100.0000 mg | ORAL_TABLET | Freq: Every day | ORAL | Status: DC
  Administered 2024-04-19 – 2024-04-22 (×4): 100 mg via ORAL
  Filled 2024-04-18 (×4): qty 1

## 2024-04-18 MED ORDER — ADULT MULTIVITAMIN W/MINERALS CH
1.0000 | ORAL_TABLET | Freq: Every day | ORAL | Status: DC
  Administered 2024-04-19 – 2024-04-22 (×4): 1 via ORAL
  Filled 2024-04-18 (×4): qty 1

## 2024-04-18 MED ORDER — CARVEDILOL 12.5 MG PO TABS
12.5000 mg | ORAL_TABLET | Freq: Once | ORAL | Status: AC
  Administered 2024-04-18: 12.5 mg via ORAL
  Filled 2024-04-18: qty 1

## 2024-04-18 MED ORDER — ACETAMINOPHEN 500 MG PO TABS
1000.0000 mg | ORAL_TABLET | Freq: Four times a day (QID) | ORAL | Status: DC
  Administered 2024-04-18 – 2024-04-22 (×17): 1000 mg via ORAL
  Filled 2024-04-18 (×17): qty 2

## 2024-04-18 MED ORDER — AMLODIPINE BESYLATE 10 MG PO TABS
10.0000 mg | ORAL_TABLET | Freq: Every day | ORAL | Status: DC
  Administered 2024-04-18 – 2024-04-22 (×5): 10 mg via ORAL
  Filled 2024-04-18 (×5): qty 1

## 2024-04-18 NOTE — Progress Notes (Signed)
 Pharmacy Electrolyte Replacement  Recent Labs:  Recent Labs    04/16/24 0500 04/17/24 0504 04/18/24 0408  K 4.5   < > 3.4*  MG 2.3   < > 2.0  PHOS 3.1  --   --   CREATININE 0.55   < > 0.36*   < > = values in this interval not displayed.    Low Critical Values (K </= 2.5, Phos </= 1, Mg </= 1) Present: K = 3.4  MD Contacted: Lovick  Plan: KCL 40 mEq

## 2024-04-18 NOTE — Progress Notes (Signed)
  NEUROSURGERY PROGRESS NOTE   Pt seen and examined. No issues overnight. Reports having a great night. Feels her speech is much better today.  EXAM: Temp:  [98.1 F (36.7 C)-98.9 F (37.2 C)] 98.3 F (36.8 C) (11/19 0800) Pulse Rate:  [70-104] 77 (11/19 0915) Resp:  [15-27] 17 (11/19 0915) BP: (133-155)/(47-93) 134/53 (11/19 0900) SpO2:  [90 %-99 %] 97 % (11/19 0915) Arterial Line BP: (128-174)/(41-63) 144/53 (11/19 0915) Weight:  [63.3 kg] 63.3 kg (11/19 0402) Intake/Output      11/18 0701 11/19 0700 11/19 0701 11/20 0700   P.O. 60 240   I.V. (mL/kg) 508.3 (8) 67.6 (1.1)   NG/GT 70 70   IV Piggyback 765.3    Total Intake(mL/kg) 1403.5 (22.2) 377.6 (6)   Urine (mL/kg/hr) 4850 (3.2) 350 (2.2)   Stool 0    Total Output 4850 350   Net -3446.5 +27.6        Stool Occurrence 1 x     Awake, alert Speech fluent, naming intact. Somewhat improved repetition MAE good strength Wound c/d/I   LABS: Lab Results  Component Value Date   CREATININE 0.36 (L) 04/18/2024   BUN 11 04/18/2024   NA 139 04/18/2024   K 3.4 (L) 04/18/2024   CL 103 04/18/2024   CO2 24 04/18/2024   Lab Results  Component Value Date   WBC 12.9 (H) 04/18/2024   HGB 8.6 (L) 04/18/2024   HCT 25.8 (L) 04/18/2024   MCV 87.2 04/18/2024   PLT 336 04/18/2024     IMPRESSION: - 83 y.o. female POD# 7 s/p left crani for SDH, appears neurologically improving with mixed aphasia.  - Pulmonary edema and associated VDRF, resolved - Afib w RVR resolved  PLAN: - Cont supportive care per trauma - Cont SLP therapy, may be CIR candidate - Can f/u in outpatient NS clinic in 3 weeks, staples out 11/26 or after.   Jeanne Maizes, MD [facility]

## 2024-04-18 NOTE — Plan of Care (Signed)
 Patient did well today.  Weaning Cleviprex as PO doses increased.  Has ambulated several times to bathroom and to room chair.  Nocturnal tube feedings to start tonight to increase calories for recovery.

## 2024-04-18 NOTE — Progress Notes (Signed)
 Speech Language Pathology Treatment: Dysphagia;Cognitive-Linguistic  Patient Details Name: Jeanne Ellison MRN: [id number] DOB: 1940-12-18 Today's Date: 04/18/2024 Time: 1217-1229 SLP Time Calculation (min) (ACUTE ONLY): 12 min  Assessment / Plan / Recommendation Clinical Impression  Pt tolerating diet, able to masticate solids, no coughing with liquids. DOes reports some throat discomfort possibly from presence of cortrak. Pt has been taking pills whole in puree well. No SLP f/u needed for swallowing.  Communication is more fluent and complex today, but pt is dysarthric and has more difficulty with word finding in conversation. Will need more intensive interventions, but pt is quite fatigued today. Will f/u.    HPI HPI: Pt is an 83 year old female admitted after a ground level fall from bed to floor on 04/09/24. Suffered R posterior scalp hematoma and L subdural hematoma with 3 mm midline shift and uncal herniation, underwent L temporo-parietal craniotomy on 11/12. Intubated 11/10-11/11, but then again on 11/12 for procedure. Evaluated by SLP on 11/14 and kept NPO, but eventually intubated again on 11/16 for hypoxia until 11/17. Minimal PMH available in chart.      SLP Plan  Continue with current plan of care          Recommendations  Diet recommendations: Regular;Thin liquid                              Continue with current plan of care     Bronwyn Belasco, [name]  04/18/2024, 1:33 PM

## 2024-04-18 NOTE — TOC Progression Note (Signed)
 Transition of Care [facility]) - Progression Note    Patient Details  Name: PARLEE AMESCUA MRN: [id number] Date of Birth: 09-09-1940  Transition of Care [facility]) CM/SW Contact  Karlon Schlafer M, RN Phone Number: 04/18/2024, 3:54 PM  Clinical Narrative:    Met with patient, husband and daughter to discuss discharge planning.  PT/OT recommending CIR; patient currently remains on Cleviprex drip (weaning) and has Cortrak.  We discussed options for rehab, to include [facility], outside rehabs ([facility] and [facility]), and skilled nursing facility.  We also discussed need for 24h supervision/assistance at discharge; husband is 85yo, and is able to be there 24h/day, but will be able to provide limited physical assistance. Daughter states she can provide intermittent assistance; patient's brother lives beside her.   Patient and family seem to prefer rehab here at [facility] vs outside rehab, and state they will discuss options available as a family.   Will await medical stability and rehab admissions coordinator follow up.     Expected Discharge Plan: IP Rehab Facility Barriers to Discharge: Continued Medical Work up               Expected Discharge Plan and Services   Discharge Planning Services: CM Consult Post Acute Care Choice: IP Rehab Living arrangements for the past 2 months: Single Family Home                                       Social Drivers of Health (SDOH) Interventions SDOH Screenings   Tobacco Use: Low Risk  (04/11/2024)    Readmission Risk Interventions     No data to display         Mliss MICAEL Fass, RN, BSN  Trauma/Neuro ICU Case Manager [phone]

## 2024-04-18 NOTE — Progress Notes (Signed)
 Physical Therapy Treatment Patient Details Name: Jeanne Ellison MRN: [id number] DOB: December 08, 1940 Today's Date: 04/18/2024   History of Present Illness 83 yo female [facility] to [facility] 11/10 s/p fall with AMS. Imaging shows 6 mm L SDH with 3 mm midline shift. ETT 11/10-11/11. 11/12 neuro status change, repeat CT revealed increased left SDH with increased midline shift and early uncal herniation. L temporo-parietal craniotomy 11/12. CT chest, abdomen, pelvis; and CT cervical spine are negative for acute injury. EEG negative 11/14. 11/16 worsening hypoxia and afib rvr; intubated 11/14-11/17. PMH DM.    PT Comments  Pt up in chair upon arrival to room, eager to get out of chair and go to bathroom stating they took out my catheter, I have to go. Pt overall improved in level of physical assist needed today, tolerating repeated transfers and x2 short-distance gait bouts with min assist overall. Pt needs max safety and awareness cues throughout. Plan for intensive therapies post-acutely remains appropriate.      If plan is discharge home, recommend the following: A lot of help with walking and/or transfers;A lot of help with bathing/dressing/bathroom   Can travel by private vehicle        Equipment Recommendations  None recommended by PT    Recommendations for Other Services       Precautions / Restrictions Precautions Precautions: Fall Recall of Precautions/Restrictions: Impaired Precaution/Restrictions Comments: SBP <150, A line, RIJ PICC Restrictions Weight Bearing Restrictions Per Provider Order: No     Mobility  Bed Mobility Overal bed mobility: Needs Assistance Bed Mobility: Sit to Supine       Sit to supine: Min assist, HOB elevated   General bed mobility comments: assist for LE lift into bed. OOB in chair upon PT arrival to room    Transfers Overall transfer level: Needs assistance Equipment used: Rolling walker (2 wheels) Transfers: Sit to/from Stand Sit to Stand:  Min assist           General transfer comment: assist for rise and steady, stand x3 from chair x2 and toilet x1.    Ambulation/Gait Ambulation/Gait assistance: Min assist Gait Distance (Feet): 15 Feet (x2 - to and from bathroom) Assistive device: Rolling walker (2 wheels) Gait Pattern/deviations: Step-through pattern, Decreased stride length, Trunk flexed Gait velocity: decr     General Gait Details: assist to steady, cues for placement in RW and upright posture   Stairs             Wheelchair Mobility     Tilt Bed    Modified Rankin (Stroke Patients Only)       Balance Overall balance assessment: Needs assistance Sitting-balance support: No upper extremity supported, Feet supported Sitting balance-Leahy Scale: Fair Sitting balance - Comments: CGA   Standing balance support: Bilateral upper extremity supported, During functional activity Standing balance-Leahy Scale: Poor Standing balance comment: reliant on bilat UE Support of PT/OT                            Communication Communication Communication: Impaired Factors Affecting Communication: Reduced clarity of speech;Difficulty expressing self  Cognition Arousal: Alert Behavior During Therapy: Impulsive   PT - Cognitive impairments: Difficult to assess, Sequencing, Problem solving, Safety/Judgement, Attention, Awareness                       PT - Cognition Comments: requires frequent safety cues, aphasic changes noted but very responsive to cuing and facilitation to assist. Requires  cues to slow down, wait for assist, manage lines as pt with no awareness of safety/lines Following commands: Impaired Following commands impaired: Follows one step commands with increased time, Follows one step commands inconsistently    Cueing Cueing Techniques: Verbal cues, Gestural cues  Exercises      General Comments        Pertinent Vitals/Pain Pain Assessment Pain Assessment:  Faces Faces Pain Scale: No hurt Pain Intervention(s): Monitored during session    Home Living                          Prior Function            PT Goals (current goals can now be found in the care plan section) Acute Rehab PT Goals PT Goal Formulation: With patient Time For Goal Achievement: 04/24/24 Potential to Achieve Goals: Good Progress towards PT goals: Progressing toward goals    Frequency    Min 3X/week      PT Plan      Co-evaluation              AM-PAC PT 6 Clicks Mobility   Outcome Measure  Help needed turning from your back to your side while in a flat bed without using bedrails?: A Little Help needed moving from lying on your back to sitting on the side of a flat bed without using bedrails?: A Lot Help needed moving to and from a bed to a chair (including a wheelchair)?: A Lot Help needed standing up from a chair using your arms (e.g., wheelchair or bedside chair)?: A Lot Help needed to walk in hospital room?: A Lot Help needed climbing 3-5 steps with a railing? : Total 6 Click Score: 12    End of Session Equipment Utilized During Treatment: Gait belt Activity Tolerance: Patient tolerated treatment well;Patient limited by fatigue Patient left: with call bell/phone within reach;with family/visitor present;in bed;with bed alarm set;with nursing/sitter in room Nurse Communication: Mobility status PT Visit Diagnosis: Other abnormalities of gait and mobility (R26.89);Muscle weakness (generalized) (M62.81)     Time: 8697-8668 PT Time Calculation (min) (ACUTE ONLY): 29 min  Charges:    $Gait Training: 8-22 mins $Therapeutic Activity: 8-22 mins PT General Charges $$ ACUTE PT VISIT: 1 Visit                     [name], PT DPT Acute Rehabilitation Services Secure Chat Preferred  Office [phone]    Jeanne Ellison [place] 04/18/2024, 4:56 PM

## 2024-04-18 NOTE — Progress Notes (Signed)
 Trauma/Critical Care Follow Up Note  Subjective:    Overnight Issues:   Objective:  Vital signs for last 24 hours: Temp:  [97 F (36.1 C)-98.9 F (37.2 C)] 97 F (36.1 C) (11/19 1200) Pulse Rate:  [70-101] 78 (11/19 1215) Resp:  [15-25] 19 (11/19 1215) BP: (133-155)/(47-93) 135/52 (11/19 1200) SpO2:  [89 %-99 %] 91 % (11/19 1215) Arterial Line BP: (128-169)/(41-63) 144/55 (11/19 1215) Weight:  [63.3 kg] 63.3 kg (11/19 0402)  Hemodynamic parameters for last 24 hours: CVP:  [0 mmHg-5 mmHg] 1 mmHg  Intake/Output from previous day: 11/18 0701 - 11/19 0700 In: 1403.5 [P.O.:60; I.V.:508.3; NG/GT:70; IV Piggyback:765.3] Out: 4850 [Urine:4850]  Intake/Output this shift: Total I/O In: 433.1 [P.O.:240; I.V.:123.1; NG/GT:70] Out: 650 [Urine:650]  Vent settings for last 24 hours:    Physical Exam:  Gen: comfortable, no distress Neuro: follows commands, alert, communicative HEENT: PERRL Neck: supple CV: RRR and hypertensive Pulm: unlabored breathing on RA Abd: soft, NT  , +BM GU: urine clear and yellow, +spontaneous voids Extr: wwp, no edema  Results for orders placed or performed during the hospital encounter of 04/09/24 (from the past 24 hours)  Glucose, capillary     Status: Abnormal   Collection Time: 04/17/24  4:03 PM  Result Value Ref Range   Glucose-Capillary 135 (H) 70 - 99 mg/dL  Glucose, capillary     Status: Abnormal   Collection Time: 04/17/24  7:32 PM  Result Value Ref Range   Glucose-Capillary 121 (H) 70 - 99 mg/dL  Glucose, capillary     Status: Abnormal   Collection Time: 04/17/24 11:51 PM  Result Value Ref Range   Glucose-Capillary 129 (H) 70 - 99 mg/dL  Glucose, capillary     Status: Abnormal   Collection Time: 04/18/24  3:46 AM  Result Value Ref Range   Glucose-Capillary 109 (H) 70 - 99 mg/dL  CBC     Status: Abnormal   Collection Time: 04/18/24  4:08 AM  Result Value Ref Range   WBC 12.9 (H) 4.0 - 10.5 K/uL   RBC 2.96 (L) 3.87 - 5.11 MIL/uL    Hemoglobin 8.6 (L) 12.0 - 15.0 g/dL   HCT 74.1 (L) 63.9 - 53.9 %   MCV 87.2 80.0 - 100.0 fL   MCH 29.1 26.0 - 34.0 pg   MCHC 33.3 30.0 - 36.0 g/dL   RDW 84.0 (H) 88.4 - 84.4 %   Platelets 336 150 - 400 K/uL   nRBC 0.2 0.0 - 0.2 %  Basic metabolic panel with GFR     Status: Abnormal   Collection Time: 04/18/24  4:08 AM  Result Value Ref Range   Sodium 139 135 - 145 mmol/L   Potassium 3.4 (L) 3.5 - 5.1 mmol/L   Chloride 103 98 - 111 mmol/L   CO2 24 22 - 32 mmol/L   Glucose, Bld 117 (H) 70 - 99 mg/dL   BUN 11 8 - 23 mg/dL   Creatinine, Ser 9.63 (L) 0.44 - 1.00 mg/dL   Calcium 8.7 (L) 8.9 - 10.3 mg/dL   GFR, Estimated >39 >39 mL/min   Anion gap 12 5 - 15  Magnesium     Status: None   Collection Time: 04/18/24  4:08 AM  Result Value Ref Range   Magnesium 2.0 1.7 - 2.4 mg/dL  Glucose, capillary     Status: Abnormal   Collection Time: 04/18/24  7:37 AM  Result Value Ref Range   Glucose-Capillary 156 (H) 70 - 99 mg/dL  Glucose,  capillary     Status: Abnormal   Collection Time: 04/18/24 12:12 PM  Result Value Ref Range   Glucose-Capillary 137 (H) 70 - 99 mg/dL    Assessment & Plan: The plan of care was discussed with the bedside nurse for the day, who is in agreement with this plan and no additional concerns were raised.   Present on Admission:  SDH (subdural hematoma) (HCC)    LOS: 9 days   Additional comments:I reviewed the patient's new clinical lab test results.   and I reviewed the patients new imaging test results.    83 y/o F GLF R posterior scalp hematoma L subdural hematoma with 3mm midline shift - per Dr. Lanis, keppra , F/U CT H stable 11/10, neuro status change 11/12>>  repeat CTH with increased left SDH with increased midline shift and early uncal herniation. Patient moved to ICU for monitoring, Dr. Lanis notified and patient underwent L temporo-parietal craniotomy 11/12. On nicardipine  gtt for BP control. Neuro changes overnight. Repeat CTH done. NSGY  has seen. EEG noted. Exam much better. TBI team therapies. Acute hypoxic respiratory failure - extubated 11/11. Reintubated 11/16 for hypoxia on NRB. Resp cxs pending, on empiric unasyn  11/16 for possible aspiration. Resp cx with NRF, dc unasyn  today. Afib/RVR - back in sinus, echo done HTN/freq PVCs - added Coreg  and losartan  yest to wean cleviprex , still requiring, increased coreg  to 25BID and added norvasc  today HLD DM - SSI R hand swelling - no fx on xray FEN - NPO, TF per cortrak; PICC 11/14 - lost peripheral access; cont thiamine  to 500 q 8h. Na 139, nocturnal TF due to poor PO intake, shakes added VTE - SCD's, LMWH ID - none Foley- foley replaced 11/14 for evident retention Dispo - ICU, work to get cleviprex  off, dispo planning to CIR - family made aware today of the need for 24/7 care to qualify  Critical Care Total Time: 35 minutes  Dreama GEANNIE Hanger, MD Trauma & General Surgery Please use [email or web address] to contact on call provider  04/18/2024  *Care during the described time interval was provided by me. I have reviewed this patient's available data, including medical history, events of note, physical examination and test results as part of my evaluation.

## 2024-04-18 NOTE — Progress Notes (Signed)
 Nutrition Follow-up  DOCUMENTATION CODES:   Non-severe (moderate) malnutrition in context of chronic illness  INTERVENTION:   Encourage PO intake, reviewed menu options  Magic cup BID with meals, each supplement provides 290 kcal and 9 grams of protein  Ensure Plus High Protein BID, between meals  Nocturnal TF via Cortrak tube:  Osmolite 1.5 @ 75 ml/hr x 12 hours   Provides: 1350 kcal, 56 grams of protein, and 684 ml free water  Continue MVI and thiamine   Continue to monitor Phosphorus level x 48 hours; Potassium being followed    NUTRITION DIAGNOSIS:   Moderate Malnutrition related to chronic illness as evidenced by mild muscle depletion, energy intake < or equal to 75% for > or equal to 1 month, moderate muscle depletion. Ongoing.   GOAL:   Patient will meet greater than or equal to 90% of their needs Progressing.   MONITOR:   TF tolerance, Labs  REASON FOR ASSESSMENT:   Rounds (cortrak)    ASSESSMENT:   Pt with PMH of DM, HLD, and HTN admitted after GLF with R posterior scalp hematoma, L SDH with 3 mm midline shift, R hand swelling.  Pt discussed during ICU rounds and with RN and MD.  Pt intubated and extubated over the weekend. She is much more alert and awake this am during visit  Pt remains on Cleviprex IV.  Pt's TF was held 11/14 due to severe refeeding but was never restarted.    Spoke with pt and her daughter who is at bedside.  Pt ate ~ 50% of her pancakes this morning but only ordered soup for lunch.  We discussed adding supplements to meal trays and between meals, pt prefers vanilla ensure.  We discussed importance of nutrition, both are agreeable to utilize cortrak tube already in place for nocturnal tube feeding until PO intake improves.    11/10 - admission; intubated due to neuro status 11/11 - extubated  11/12 - Pt tx to ICU due to increasing shift from 3 mm to 11 mm; s/p cortrak placement with tip gastric 11/14 - TF paused, severe  hypophosphatemia; IV repletion ordered, PICC placed, high dose thiamine started 11/16 - intubated 11/17 - extubated   Medications reviewed and include:  Colace BID, SSI every 4 hours, MVI with minerals, miralax daily, 40 mEq KCl x 1, thiamine  Cleviprex @ 12 ml/hr   Labs reviewed:  K 3.2 -> 3.0 -> 3.7 -> 3.5 -> 3.3 -> 4.5 -> 3.2 -> 3.4 Phos  <1.0 -> 3.1 -> 2.6 -> 2.9 -> 3.1   CBG's: 105 - 164  Admission weight: 155 lb (est) Current weight: 139.5 lb   Diet Order:   Diet Order             Diet regular Room service appropriate? Yes with Assist; Fluid consistency: Thin  Diet effective now                   EDUCATION NEEDS:   Education needs have been addressed  Skin:  Skin Assessment: Reviewed RN Assessment  Last BM:  11/18 large type 6  Height:   Ht Readings from Last 1 Encounters:  04/09/24 5' 5.5 (1.664 m)    Weight:   Wt Readings from Last 1 Encounters:  04/18/24 63.3 kg    BMI:  Body mass index is 22.87 kg/m.  Estimated Nutritional Needs:   Kcal:  1600-1800  Protein:  85-100 grams  Fluid:  >1.6 L/day  Powell SQUIBB., RD, LDN, CNSC See  AMiON for contact information

## 2024-04-19 LAB — BASIC METABOLIC PANEL WITH GFR
Anion gap: 11 (ref 5–15)
BUN: 15 mg/dL (ref 8–23)
CO2: 26 mmol/L (ref 22–32)
Calcium: 8.4 mg/dL — ABNORMAL LOW (ref 8.9–10.3)
Chloride: 103 mmol/L (ref 98–111)
Creatinine, Ser: 0.42 mg/dL — ABNORMAL LOW (ref 0.44–1.00)
GFR, Estimated: >60 mL/min (ref >60)
Glucose, Bld: 213 mg/dL — ABNORMAL HIGH (ref 70–99)
Potassium: 3.6 mmol/L (ref 3.5–5.1)
Sodium: 140 mmol/L (ref 135–145)

## 2024-04-19 LAB — GLUCOSE, CAPILLARY
Glucose-Capillary: 209 mg/dL — ABNORMAL HIGH (ref 70–99)
Glucose-Capillary: 80 mg/dL (ref 70–99)
Glucose-Capillary: 143 mg/dL — ABNORMAL HIGH (ref 70–99)
Glucose-Capillary: 143 mg/dL — ABNORMAL HIGH (ref 70–99)
Glucose-Capillary: 199 mg/dL — ABNORMAL HIGH (ref 70–99)
Glucose-Capillary: 201 mg/dL — ABNORMAL HIGH (ref 70–99)

## 2024-04-19 LAB — PHOSPHORUS: Phosphorus: 2.8 mg/dL (ref 2.5–4.6)

## 2024-04-19 MED ORDER — HYDRALAZINE HCL 50 MG PO TABS
50.0000 mg | ORAL_TABLET | Freq: Three times a day (TID) | ORAL | Status: DC
  Administered 2024-04-19 – 2024-04-22 (×11): 50 mg via ORAL
  Filled 2024-04-19 (×11): qty 1

## 2024-04-19 NOTE — Progress Notes (Signed)
 Patient ID: Jeanne Ellison, female   DOB: [birth year], 83 y.o.   MRN: [id number] Follow up - Trauma Critical Care   Patient Details:    Jeanne Ellison is an 83 y.o. female.  Lines/tubes : PICC Double Lumen 04/13/24 Right Basilic 37 cm 0 cm (Active)  Indication for Insertion or Continuance of Line Prolonged intravenous therapies 04/19/24 0400  Exposed Catheter (cm) 0 cm 04/13/24 1201  Site Assessment Clean, Dry, Intact 04/19/24 0400  Lumen #1 Status Infusing 04/19/24 0400  Lumen #2 Status Saline locked;Flushed 04/19/24 0400  Dressing Type Transparent 04/19/24 0400  Dressing Status Clean, Dry, Intact 04/19/24 0400  Line Care Zeroed and calibrated 04/18/24 0800  Line Adjustment (NICU/IV Team Only) No 04/15/24 0800  Dressing Intervention New dressing 04/13/24 1201  Dressing Change Due 04/20/24 04/18/24 0800     Arterial Line 04/11/24 Right Radial (Active)  Site Assessment Clean, Dry, Intact 04/19/24 0400  Line Status Pulsatile blood flow 04/19/24 0400  Art Line Waveform Appropriate 04/19/24 0400  Art Line Interventions Zeroed and calibrated 04/18/24 2000  Color/Movement/Sensation Capillary refill less than 3 sec 04/18/24 2000  Dressing Type Transparent 04/19/24 0400  Dressing Status Clean, Dry, Intact 04/19/24 0400  Dressing Change Due 04/18/24 04/18/24 0800    Microbiology/Sepsis markers: Results for orders placed or performed during the hospital encounter of 04/09/24  MRSA Next Gen by PCR, Nasal     Status: None   Collection Time: 04/09/24 12:21 PM   Specimen: Nasal Mucosa; Nasal Swab  Result Value Ref Range Status   MRSA by PCR Next Gen NOT DETECTED NOT DETECTED Final    Comment: (NOTE) The GeneXpert MRSA Assay (FDA approved for NASAL specimens only), is one component of a comprehensive MRSA colonization surveillance program. It is not intended to diagnose MRSA infection nor to guide or monitor treatment for MRSA infections. Test performance is not FDA approved in  patients less than 74 years old. Performed at [facility], [address] [place], [place] [address]   Culture, Respiratory w Gram Stain     Status: None   Collection Time: 04/15/24 10:41 AM   Specimen: Tracheal Aspirate; Respiratory  Result Value Ref Range Status   Specimen Description TRACHEAL ASPIRATE  Final   Special Requests NONE  Final   Gram Stain   Final    NO WBC SEEN FEW GRAM POSITIVE COCCI RARE GRAM NEGATIVE RODS    Culture   Final    Normal respiratory flora-no Staph aureus or Pseudomonas seen Performed at [facility], [address], [place], [place] [address]    Report Status 04/17/2024 FINAL  Final    Anti-infectives:  Anti-infectives (From admission, onward)    Start     Dose/Rate Route Frequency Ordered Stop   04/15/24 0615  Ampicillin-Sulbactam (UNASYN) 3 g in sodium chloride 0.9 % 100 mL IVPB  Status:  Discontinued        3 g 200 mL/hr over 30 Minutes Intravenous Every 6 hours 04/15/24 0529 04/18/24 1209   04/11/24 1600  ceFAZolin (ANCEF) 2-4 GM/100ML-% IVPB       Note to Pharmacy: Atanacio Race: cabinet override      04/11/24 1600 04/12/24 0414      Consults: Treatment Team:  Md, Trauma, MD    Studies:    Events:  Subjective:    Overnight Issues: still requiring clevi, reports her brain feels foggy, cortrak irritating throat  Objective:  Vital signs for last 24 hours: Temp:  [97 F (  36.1 C)-98.7 F (37.1 C)] 98.7 F (37.1 C) (11/20 0800) Pulse Rate:  [69-97] 76 (11/20 0800) Resp:  [14-24] 21 (11/20 0800) BP: (119-150)/(49-68) 142/67 (11/20 0800) SpO2:  [86 %-100 %] 98 % (11/20 0800) Arterial Line BP: (115-166)/(34-68) 149/58 (11/20 0800) Weight:  [63.3 kg] 63.3 kg (11/20 0717)  Hemodynamic parameters for last 24 hours: CVP:  [0 mmHg-8 mmHg] 3 mmHg  Intake/Output from previous day: 11/19 0701 - 11/20 0700 In: 1609 [P.O.:360; I.V.:294; NG/GT:955] Out: 650 [Urine:650]  Intake/Output this shift: Total I/O In: 240  [P.O.:240] Out: -   Vent settings for last 24 hours:    Physical Exam:  General: alert and no respiratory distress Neuro: alert, oriented, and some word finding issues, oriented to place and year HEENT/Neck: crani dressing Resp: clear to auscultation bilaterally CVS: RRR GI: soft, NT Extremities: calves soft  Results for orders placed or performed during the hospital encounter of 04/09/24 (from the past 24 hours)  Glucose, capillary     Status: Abnormal   Collection Time: 04/18/24 12:12 PM  Result Value Ref Range   Glucose-Capillary 137 (H) 70 - 99 mg/dL  Glucose, capillary     Status: Abnormal   Collection Time: 04/18/24  3:26 PM  Result Value Ref Range   Glucose-Capillary 117 (H) 70 - 99 mg/dL  Glucose, capillary     Status: Abnormal   Collection Time: 04/18/24  7:38 PM  Result Value Ref Range   Glucose-Capillary 170 (H) 70 - 99 mg/dL  Glucose, capillary     Status: Abnormal   Collection Time: 04/18/24 11:46 PM  Result Value Ref Range   Glucose-Capillary 172 (H) 70 - 99 mg/dL  Glucose, capillary     Status: Abnormal   Collection Time: 04/19/24  3:09 AM  Result Value Ref Range   Glucose-Capillary 209 (H) 70 - 99 mg/dL  Basic metabolic panel with GFR     Status: Abnormal   Collection Time: 04/19/24  3:18 AM  Result Value Ref Range   Sodium 140 135 - 145 mmol/L   Potassium 3.6 3.5 - 5.1 mmol/L   Chloride 103 98 - 111 mmol/L   CO2 26 22 - 32 mmol/L   Glucose, Bld 213 (H) 70 - 99 mg/dL   BUN 15 8 - 23 mg/dL   Creatinine, Ser 9.57 (L) 0.44 - 1.00 mg/dL   Calcium  8.4 (L) 8.9 - 10.3 mg/dL   GFR, Estimated >39 >39 mL/min   Anion gap 11 5 - 15  Phosphorus     Status: None   Collection Time: 04/19/24  3:18 AM  Result Value Ref Range   Phosphorus 2.8 2.5 - 4.6 mg/dL  Glucose, capillary     Status: None   Collection Time: 04/19/24  7:17 AM  Result Value Ref Range   Glucose-Capillary 80 70 - 99 mg/dL    Assessment & Plan: Present on Admission:  SDH (subdural hematoma)  (HCC)    LOS: 10 days   Additional comments:I reviewed the patient's new clinical lab test results. / 83 y/o F GLF R posterior scalp hematoma L subdural hematoma with 3mm midline shift - per Dr. Lanis, keppra , F/U CT H stable 11/10, neuro status change 11/12>>  repeat CTH with increased left SDH with increased midline shift and early uncal herniation. Patient moved to ICU for monitoring, Dr. Lanis notified and patient underwent L temporo-parietal craniotomy 11/12. On nicardipine  gtt for BP control. Neuro changes overnight. Repeat CTH done. NSGY has seen. EEG noted. Exam much better. TBI  team therapies. Acute hypoxic respiratory failure - extubated 11/11. Reintubated 11/16 for hypoxia on NRB. Resp cxs pending, on empiric unasyn  11/16 for possible aspiration. Resp cx with NRF, dcd unasyn  Afib/RVR - back in sinus, echo done HTN/freq PVCs - added Coreg  and losartan  yest to wean cleviprex , still requiring, increased coreg  to 25BID and added norvasc  11/19, add scheduled hydralazine  today HLD DM - SSI R hand swelling - no fx on xray FEN - reg diet and ate well this AM. If does well at lunch will D/C cortrak and nocturnal TF VTE - SCD's, LMWH ID - none Foley- foley replaced 11/14 for evident retention Dispo - ICU, add scheduled hydralazine  and work to get cleviprex  off, dispo planning to CIR - family looking into support after D/C Critical Care Total Time*: 34 Minutes  Dann Hummer, MD, MPH, FACS Trauma & General Surgery Use [email or web address] to contact on call provider  04/19/2024  *Care during the described time interval was provided by me. I have reviewed this patient's available data, including medical history, events of note, physical examination and test results as part of my evaluation.

## 2024-04-19 NOTE — Plan of Care (Signed)
  Problem: Education: Goal: Knowledge of General Education information will improve Description: Including pain rating scale, medication(s)/side effects and non-pharmacologic comfort measures Outcome: Progressing   Problem: Clinical Measurements: Goal: Ability to maintain clinical measurements within normal limits will improve Outcome: Progressing Goal: Diagnostic test results will improve Outcome: Progressing Goal: Respiratory complications will improve Outcome: Progressing Goal: Cardiovascular complication will be avoided Outcome: Progressing   Problem: Clinical Measurements: Goal: Diagnostic test results will improve Outcome: Progressing   Problem: Clinical Measurements: Goal: Respiratory complications will improve Outcome: Progressing   Problem: Clinical Measurements: Goal: Cardiovascular complication will be avoided Outcome: Progressing   Problem: Coping: Goal: Level of anxiety will decrease Outcome: Progressing   Problem: Role Relationship: Goal: Method of communication will improve Outcome: Progressing

## 2024-04-19 NOTE — Progress Notes (Signed)
 Physical Therapy Treatment Patient Details Name: Jeanne Ellison MRN: [id number] DOB: 10/20/1940 Today's Date: 04/19/2024   History of Present Illness 83 yo female presents to [facility] 11/10 s/p fall with AMS. Imaging shows 6 mm L SDH with 3 mm midline shift. ETT 11/10-11/11. 11/12 neuro status change, repeat CT revealed increased left SDH with increased midline shift and early uncal herniation. L temporo-parietal craniotomy 11/12. CT chest, abdomen, pelvis; and CT cervical spine are negative for acute injury. EEG negative 11/14. 11/16 worsening hypoxia and afib rvr; intubated 11/14-11/17. PMH DM.    PT Comments  Pt resting in bed upon arrival to room, initially speech clear and fluent stating she didn't sleep well overnight but is agreeable to OOB mobility. Pt demonstrating good mobility progression this date, ambulating in hallway with light steadying and RW management assist. Pt with increasing dysarthria with fatigue, RN notified and anticipate this is fatigue-related. Pt plan remains appropriate.   BP 116/100 post-gait, SPO2 93-98% on RA throughout    If plan is discharge home, recommend the following: A lot of help with bathing/dressing/bathroom;A little help with walking and/or transfers   Can travel by private vehicle        Equipment Recommendations  None recommended by PT    Recommendations for Other Services       Precautions / Restrictions Precautions Precautions: Fall Recall of Precautions/Restrictions: Impaired Precaution/Restrictions Comments: SBP <150 Restrictions Weight Bearing Restrictions Per Provider Order: No     Mobility  Bed Mobility Overal bed mobility: Needs Assistance Bed Mobility: Sit to Supine, Supine to Sit     Supine to sit: Min assist Sit to supine: Min assist   General bed mobility comments: assist for trunk and LE management, pt able to boost self up in bed with HOB flat.    Transfers Overall transfer level: Needs assistance Equipment  used: Rolling walker (2 wheels) Transfers: Sit to/from Stand Sit to Stand: Min assist           General transfer comment: slow to rise, light boost up assist    Ambulation/Gait Ambulation/Gait assistance: Min assist Gait Distance (Feet): 125 Feet Assistive device: Rolling walker (2 wheels) Gait Pattern/deviations: Step-through pattern, Decreased stride length, Trunk flexed Gait velocity: decr     General Gait Details: assist to steady, cues for proximity to RW and upright posture   Stairs             Wheelchair Mobility     Tilt Bed    Modified Rankin (Stroke Patients Only)       Balance Overall balance assessment: Needs assistance Sitting-balance support: No upper extremity supported, Feet supported Sitting balance-Leahy Scale: Fair     Standing balance support: Single extremity supported, During functional activity Standing balance-Leahy Scale: Fair Standing balance comment: statically at the sink/toilet                            Communication Communication Communication: Impaired Factors Affecting Communication: Reduced clarity of speech;Difficulty expressing self  Cognition Arousal: Alert Behavior During Therapy: Impulsive   PT - Cognitive impairments: Sequencing, Problem solving, Safety/Judgement                       PT - Cognition Comments: requires cuing for hallway navigation, safe use of RW. cues for safety throughout Following commands: Impaired Following commands impaired: Follows one step commands with increased time    Cueing Cueing Techniques: Verbal cues, Gestural cues  Exercises      General Comments General comments (skin integrity, edema, etc.): VSS on RA, left on RA at end of session with SPO2 96% with permission of RN      Pertinent Vitals/Pain Pain Assessment Pain Assessment: No/denies pain Pain Intervention(s): Monitored during session    Home Living                          Prior  Function            PT Goals (current goals can now be found in the care plan section) Acute Rehab PT Goals PT Goal Formulation: With patient Time For Goal Achievement: 04/24/24 Potential to Achieve Goals: Good Progress towards PT goals: Progressing toward goals    Frequency    Min 3X/week      PT Plan      Co-evaluation              AM-PAC PT 6 Clicks Mobility   Outcome Measure  Help needed turning from your back to your side while in a flat bed without using bedrails?: A Little Help needed moving from lying on your back to sitting on the side of a flat bed without using bedrails?: A Little Help needed moving to and from a bed to a chair (including a wheelchair)?: A Little Help needed standing up from a chair using your arms (e.g., wheelchair or bedside chair)?: A Little Help needed to walk in hospital room?: A Lot Help needed climbing 3-5 steps with a railing? : Total 6 Click Score: 15    End of Session Equipment Utilized During Treatment: Gait belt Activity Tolerance: Patient tolerated treatment well;Patient limited by fatigue Patient left: with call bell/phone within reach;with family/visitor present;in bed;with bed alarm set Nurse Communication: Mobility status PT Visit Diagnosis: Other abnormalities of gait and mobility (R26.89);Muscle weakness (generalized) (M62.81)     Time: 8476-8452 PT Time Calculation (min) (ACUTE ONLY): 24 min  Charges:    $Gait Training: 8-22 mins $Therapeutic Activity: 8-22 mins PT General Charges $$ ACUTE PT VISIT: 1 Visit                     [name], PT DPT Acute Rehabilitation Services Secure Chat Preferred  Office [phone]    Hadley Soileau [place] 04/19/2024, 4:51 PM

## 2024-04-19 NOTE — Progress Notes (Signed)
 Occupational Therapy Treatment Patient Details Name: Jeanne Ellison MRN: [id number] DOB: 1940-12-28 Today's Date: 04/19/2024   History of present illness 83 yo female presents to [facility] 11/10 s/p fall with AMS. Imaging shows 6 mm L SDH with 3 mm midline shift. ETT 11/10-11/11. 11/12 neuro status change, repeat CT revealed increased left SDH with increased midline shift and early uncal herniation. L temporo-parietal craniotomy 11/12. CT chest, abdomen, pelvis; and CT cervical spine are negative for acute injury. EEG negative 11/14. 11/16 worsening hypoxia and afib rvr; intubated 11/14-11/17. PMH DM.   OT comments  Pt is making good progress towards their acute OT goals. Overall pt required min A for transfers and functional ambulation with RW physically for balance, and mod-max cues for initiation, problem solving, safety and direction following. She groomed and toileted with min A. OT to continue to follow acutely to facilitate progress towards established goals. Pt will continue to benefit from intensive inpatient follow up therapy, >3 hours/day after discharge.        If plan is discharge home, recommend the following:  A lot of help with walking and/or transfers;A lot of help with bathing/dressing/bathroom;Assistance with cooking/housework;Direct supervision/assist for medications management;Direct supervision/assist for financial management;Assist for transportation;Help with stairs or ramp for entrance;Supervision due to cognitive status   Equipment Recommendations  None recommended by OT    Recommendations for Other Services Rehab consult    Precautions / Restrictions Precautions Precautions: Fall Recall of Precautions/Restrictions: Impaired Precaution/Restrictions Comments: SBP <150, RIJ PICC Restrictions Weight Bearing Restrictions Per Provider Order: No       Mobility Bed Mobility Overal bed mobility: Needs Assistance Bed Mobility: Sit to Supine, Supine to Sit      Supine to sit: Min assist Sit to supine: Min assist        Transfers Overall transfer level: Needs assistance Equipment used: Rolling walker (2 wheels) Transfers: Sit to/from Stand Sit to Stand: Min assist           General transfer comment: slow to rise     Balance Overall balance assessment: Needs assistance Sitting-balance support: No upper extremity supported, Feet supported Sitting balance-Leahy Scale: Fair     Standing balance support: Single extremity supported, During functional activity Standing balance-Leahy Scale: Fair Standing balance comment: statically at the sink/toilet                           ADL either performed or assessed with clinical judgement   ADL Overall ADL's : Needs assistance/impaired     Grooming: Contact guard assist;Standing;Wash/dry hands;Wash/dry Scientist, Research (physical Sciences): Minimal assistance;Ambulation;Rolling walker (2 wheels);Regular Toilet   Toileting- Clothing Manipulation and Hygiene: Contact guard assist;Sit to/from stand Toileting - Clothing Manipulation Details (indicate cue type and reason): continent bladder at the toilet     Functional mobility during ADLs: Minimal assistance;Rolling walker (2 wheels) General ADL Comments: limited by balance, cognition and activity tolerance    Extremity/Trunk Assessment Upper Extremity Assessment Upper Extremity Assessment: Defer to OT evaluation RUE Deficits / Details: overall WFL, globally 4/5. coordination WFL for grooming adn toileting LUE Deficits / Details: overall WFL, globally 4/5. coordination WFL for grooming adn toileting   Lower Extremity Assessment Lower Extremity Assessment: Defer to PT evaluation        Vision   Vision Assessment?: No apparent visual deficits   Perception Perception Perception: Not tested  Praxis Praxis Praxis: Not tested   Communication Communication Communication: Impaired Factors Affecting  Communication: Reduced clarity of speech;Difficulty expressing self   Cognition Arousal: Alert Behavior During Therapy: Impulsive Cognition: Cognition impaired, Difficult to assess Difficult to assess due to: Impaired communication Orientation impairments: Situation Awareness: Intellectual awareness impaired, Online awareness impaired Memory impairment (select all impairments): Declarative long-term memory, Non-declarative long-term memory, Working memory Attention impairment (select first level of impairment): Selective attention Executive functioning impairment (select all impairments): Organization, Problem solving, Reasoning OT - Cognition Comments: difficult to fully assess due to impaired communication. Pt follows most simple step cues but was unable to carry out a 2 step command. She sequenced through ADLs [facility]                 Following commands: Impaired Following commands impaired: Follows one step commands with increased time, Follows one step commands inconsistently      Cueing   Cueing Techniques: Verbal cues, Gestural cues  Exercises      Shoulder Instructions       General Comments VSS on RA, 2L placed back on pt at the end of the session    Pertinent Vitals/ Pain       Pain Assessment Pain Assessment: Faces Faces Pain Scale: No hurt Pain Intervention(s): Monitored during session  Home Living                                          Prior Functioning/Environment              Frequency  Min 2X/week        Progress Toward Goals  OT Goals(current goals can now be found in the care plan section)  Progress towards OT goals: Progressing toward goals  Acute Rehab OT Goals Patient Stated Goal: to rest OT Goal Formulation: With patient Time For Goal Achievement: 04/27/24 Potential to Achieve Goals: Good ADL Goals Pt Will Perform Grooming: with set-up;sitting Pt Will Perform Upper Body Dressing: with set-up;sitting Pt Will  Perform Lower Body Dressing: with min assist;sit to/from stand Pt Will Transfer to Toilet: with contact guard assist;ambulating Additional ADL Goal #1: Pt will follow simple 1 step commands 100% of attempts  Plan      Co-evaluation                 AM-PAC OT 6 Clicks Daily Activity     Outcome Measure   Help from another person eating meals?: A Lot Help from another person taking care of personal grooming?: A Little Help from another person toileting, which includes using toliet, bedpan, or urinal?: A Little Help from another person bathing (including washing, rinsing, drying)?: A Lot Help from another person to put on and taking off regular upper body clothing?: A Little Help from another person to put on and taking off regular lower body clothing?: A Lot 6 Click Score: 15    End of Session Equipment Utilized During Treatment: Gait belt;Rolling walker (2 wheels)  OT Visit Diagnosis: Unsteadiness on feet (R26.81);Other abnormalities of gait and mobility (R26.89);Muscle weakness (generalized) (M62.81);Cognitive communication deficit (R41.841) Symptoms and signs involving cognitive functions: Nontraumatic intracerebral hemorrhage   Activity Tolerance Patient tolerated treatment well   Patient Left in bed;with call bell/phone within reach;with bed alarm set;with family/visitor present   Nurse Communication Mobility status        Time: 8956-8894 OT Time Calculation (min): 22 min  Charges: OT General Charges $OT Visit: 1 Visit OT Treatments $Self Care/Home Management : 8-22 mins  Jeanne Ellison, OTR/L Acute Rehabilitation Services Office [phone] Secure Chat Communication Preferred   Jeanne Ellison 04/19/2024, 11:27 AM

## 2024-04-19 NOTE — Progress Notes (Signed)
   04/19/24 1800  Vitals  Temp 98.4 F (36.9 C)  Temp Source Oral  BP (!) 149/52  MAP (mmHg) 81  BP Location Left Arm  BP Method Automatic  Patient Position (if appropriate) Lying  Pulse Rate 72  Pulse Rate Source Monitor  ECG Heart Rate 73  Resp 20  Level of Consciousness  Level of Consciousness Alert  MEWS COLOR  MEWS Score Color Green  Oxygen Therapy  SpO2 97 %  O2 Device Room Air  Pain Assessment  Pain Scale 0-10  Pain Score 0  Complaints & Interventions  Neuro symptoms relieved by Rest  Diarrhea relieved by Nothing  PCA/Epidural/Spinal Assessment  Respiratory Pattern Regular;Unlabored;Symmetrical  Glasgow Coma Scale  Eye Opening 4  Best Verbal Response (NON-intubated) 5  Best Motor Response 6  Glasgow Coma Scale Score 15  MEWS Score  MEWS Temp 0  MEWS Systolic 0  MEWS Pulse 0  MEWS RR 0  MEWS LOC 0  MEWS Score 0   Pt transferred to unit from 4N ICU with all personal belongings. Pt A&Ox4, MAEx4. Pt alert and verbally responsive. Skin assessed with second RN per hospital policy with no pressure injuries noted. Surgical incision to left side of head approximated, clean and dry. Cortrak marked at right nare and irrigated. Tele applied and CCMD called. Pt oriented to unit, room, and call light system. Pt in bed with alarm on and call bell within reach. Pt's son at bedside. Report given to oncoming shift.

## 2024-04-20 LAB — PHOSPHORUS: Phosphorus: 3 mg/dL (ref 2.5–4.6)

## 2024-04-20 LAB — GLUCOSE, CAPILLARY
Glucose-Capillary: 102 mg/dL — ABNORMAL HIGH (ref 70–99)
Glucose-Capillary: 125 mg/dL — ABNORMAL HIGH (ref 70–99)
Glucose-Capillary: 140 mg/dL — ABNORMAL HIGH (ref 70–99)
Glucose-Capillary: 159 mg/dL — ABNORMAL HIGH (ref 70–99)
Glucose-Capillary: 170 mg/dL — ABNORMAL HIGH (ref 70–99)
Glucose-Capillary: 174 mg/dL — ABNORMAL HIGH (ref 70–99)

## 2024-04-20 LAB — BASIC METABOLIC PANEL WITH GFR
Anion gap: 13 (ref 5–15)
BUN: 21 mg/dL (ref 8–23)
CO2: 23 mmol/L (ref 22–32)
Calcium: 8.8 mg/dL — ABNORMAL LOW (ref 8.9–10.3)
Chloride: 105 mmol/L (ref 98–111)
Creatinine, Ser: 0.54 mg/dL (ref 0.44–1.00)
GFR, Estimated: >60 mL/min (ref >60)
Glucose, Bld: 175 mg/dL — ABNORMAL HIGH (ref 70–99)
Potassium: 3.4 mmol/L — ABNORMAL LOW (ref 3.5–5.1)
Sodium: 141 mmol/L (ref 135–145)

## 2024-04-20 MED ORDER — POTASSIUM CHLORIDE 20 MEQ PO PACK
40.0000 meq | PACK | Freq: Once | ORAL | Status: AC
  Administered 2024-04-20: 40 meq
  Filled 2024-04-20: qty 2

## 2024-04-20 MED ORDER — PSYLLIUM 95 % PO PACK
1.0000 | PACK | Freq: Two times a day (BID) | ORAL | Status: DC
  Administered 2024-04-20 – 2024-04-22 (×4): 1 via ORAL
  Filled 2024-04-20 (×7): qty 1

## 2024-04-20 MED ORDER — GLUCERNA SHAKE PO LIQD
237.0000 mL | Freq: Three times a day (TID) | ORAL | Status: DC
  Administered 2024-04-20 – 2024-04-22 (×6): 237 mL via ORAL

## 2024-04-20 NOTE — Progress Notes (Addendum)
 Nutrition Follow-up  DOCUMENTATION CODES:   Non-severe (moderate) malnutrition in context of chronic illness  INTERVENTION:  Encourage PO intake, on regular diet Room service with assist Encouraged family to bring in food for pt Glucerna Shake po TID, each supplement provides 220 kcal and 10 grams of protein Mighty Shake TID with meals, each supplement provides 330 kcals and 9 grams of protein Continue MVI and thiamine   Monitor Potassium  Recommend adding Imodium/fiber to regimen for loose BM  Remove cortrak   NUTRITION DIAGNOSIS:   Moderate Malnutrition related to chronic illness as evidenced by mild muscle depletion, energy intake < or equal to 75% for > or equal to 1 month, moderate muscle depletion. - Ongoing   GOAL:   Patient will meet greater than or equal to 90% of their needs - Progressing   MONITOR:   TF tolerance, Labs  REASON FOR ASSESSMENT:   Rounds (cortrak)    ASSESSMENT:  Pt with PMH of DM, HLD, and HTN admitted after GLF with R posterior scalp hematoma, L SDH with 3 mm midline shift, R hand swelling.   11/10 - admission; intubated due to neuro status 11/11 - extubated  11/12 - Pt tx to ICU due to increasing shift from 3 mm to 11 mm; s/p cortrak placement with tip gastric 11/14 - TF paused, severe hypophosphatemia; IV repletion ordered, PICC placed, high dose thiamine  started 11/16 - intubated 11/17 - extubated 11/18 - Regular diet  11/19 - Nocturnal tube feeds 11/21 - Cortrak removed   PT up in chair with son at bedside. Intake has been poor as pt has been having nausea all throughout the day. Pt had 25% of an omelet and 3 strips of bacon for breakfast. Pt was given Zofran  this morning which has seemed to help pt's intake. For lunch had a small chicken side salad and her son was going to bring in more food for her to eat. Son reports pt appears to be at baseline now with her intake as she did not eat much PTA. Typically 2 meals per day and a snack for  dinner. Discussed with surgery, remove cortrak today and supplement intake with ONS.   Per RN, pt has been having multiple loose BM x 6 documented yesterday with bowel regimen on hold. Recommended imodium/fiber be added.  Pt does not drink the Ensures as pt reports she has IBS and Ensures go right through her. Pt has had Glucerna's in the past and is willing to try these.   Dispo: CIR  Admit weight: 66.3 kg Current weight: 63.6 kg   Average Meal Intake: 11/19: 50% intake x 1 recorded meals 11/20: 10-20% x 2 recorded meals   Nutritionally Relevant Medications: Scheduled Meds:  feeding supplement (GLUCERNA SHAKE)  237 mL Oral TID BM   hydrALAZINE   50 mg Oral Q8H   insulin  aspart  0-15 Units Subcutaneous Q4H   multivitamin with minerals  1 tablet Oral Daily   thiamine   100 mg Oral Daily   Continuous Infusions:  clevidipine  Stopped (04/19/24 1034)   Labs Reviewed: Potassium 3.4 CBG ranges from 80-201 mg/dL over the last 24 hours HgbA1c 6.0   Diet Order:   Diet Order             Diet regular Room service appropriate? Yes with Assist; Fluid consistency: Thin  Diet effective now                   EDUCATION NEEDS:   Education needs have been addressed  Skin:  Skin Assessment: Skin Integrity Issues: Skin Integrity Issues:: Incisions Incisions: Closed surgical incision head  Last BM:  11/21 x 2, type 7  Height:   Ht Readings from Last 1 Encounters:  04/09/24 5' 5.5 (1.664 m)    Weight:   Wt Readings from Last 1 Encounters:  04/20/24 63.6 kg   BMI:  Body mass index is 22.98 kg/m.  Estimated Nutritional Needs:   Kcal:  1600-1800  Protein:  80-100 gm  Fluid:  >1.6 L/day  Olivia Kenning, RD Registered Dietitian  See Amion for more information

## 2024-04-20 NOTE — Progress Notes (Signed)
   04/20/24 1405  Provider Notification  Provider Name/Title Simaan PA  Date Provider Notified 04/20/24  Time Provider Notified 1403  Method of Notification  (Secure chat)  Notification Reason Other (Comment) (No indication for PICC. Has 2 working peripheral IVs)  Provider response See new orders (Remove PICC)  Date of Provider Response 04/20/24  Time of Provider Response [phone]

## 2024-04-20 NOTE — Care Management Important Message (Signed)
 Important Message  Patient Details  Name: Jeanne Ellison MRN: [id number] Date of Birth: [birth year]   Important Message Given:  Yes - Medicare IM     [name] 04/20/2024, 12:56 PM

## 2024-04-20 NOTE — Care Management Important Message (Signed)
 Important Message  Patient Details  Name: Jeanne Ellison MRN: [id number] Date of Birth: 11-16-1940   Important Message Given:  Yes - Medicare IM     [name] 04/20/2024, 12:55 PM

## 2024-04-20 NOTE — Progress Notes (Signed)
 Patient ID: Jeanne Ellison, female   DOB: Jun 03, 1940, 83 y.o.   MRN: [id number] Follow up - Trauma Critical Care   Patient Details:    Jeanne Ellison is an 83 y.o. female.  Lines/tubes : PICC Double Lumen 04/13/24 Right Basilic 37 cm 0 cm (Active)  Indication for Insertion or Continuance of Line Prolonged intravenous therapies 04/19/24 0400  Exposed Catheter (cm) 0 cm 04/13/24 1201  Site Assessment Clean, Dry, Intact 04/19/24 0400  Lumen #1 Status Infusing 04/19/24 0400  Lumen #2 Status Saline locked;Flushed 04/19/24 0400  Dressing Type Transparent 04/19/24 0400  Dressing Status Clean, Dry, Intact 04/19/24 0400  Line Care Zeroed and calibrated 04/18/24 0800  Line Adjustment (NICU/IV Team Only) No 04/15/24 0800  Dressing Intervention New dressing 04/13/24 1201  Dressing Change Due 04/20/24 04/18/24 0800     Arterial Line 04/11/24 Right Radial (Active)  Site Assessment Clean, Dry, Intact 04/19/24 0400  Line Status Pulsatile blood flow 04/19/24 0400  Art Line Waveform Appropriate 04/19/24 0400  Art Line Interventions Zeroed and calibrated 04/18/24 2000  Color/Movement/Sensation Capillary refill less than 3 sec 04/18/24 2000  Dressing Type Transparent 04/19/24 0400  Dressing Status Clean, Dry, Intact 04/19/24 0400  Dressing Change Due 04/18/24 04/18/24 0800    Microbiology/Sepsis markers: Results for orders placed or performed during the hospital encounter of 04/09/24  MRSA Next Gen by PCR, Nasal     Status: None   Collection Time: 04/09/24 12:21 PM   Specimen: Nasal Mucosa; Nasal Swab  Result Value Ref Range Status   MRSA by PCR Next Gen NOT DETECTED NOT DETECTED Final    Comment: (NOTE) The GeneXpert MRSA Assay (FDA approved for NASAL specimens only), is one component of a comprehensive MRSA colonization surveillance program. It is not intended to diagnose MRSA infection nor to guide or monitor treatment for MRSA infections. Test performance is not FDA approved in  patients less than 33 years old. Performed at [facility], [address] [place], [place] [address]   Culture, Respiratory w Gram Stain     Status: None   Collection Time: 04/15/24 10:41 AM   Specimen: Tracheal Aspirate; Respiratory  Result Value Ref Range Status   Specimen Description TRACHEAL ASPIRATE  Final   Special Requests NONE  Final   Gram Stain   Final    NO WBC SEEN FEW GRAM POSITIVE COCCI RARE GRAM NEGATIVE RODS    Culture   Final    Normal respiratory flora-no Staph aureus or Pseudomonas seen Performed at [facility], [address] [place], [place] [address]    Report Status 04/17/2024 FINAL  Final    Anti-infectives:  Anti-infectives (From admission, onward)    Start     Dose/Rate Route Frequency Ordered Stop   04/15/24 0615  Ampicillin -Sulbactam (UNASYN ) 3 g in sodium chloride  0.9 % 100 mL IVPB  Status:  Discontinued        3 g 200 mL/hr over 30 Minutes Intravenous Every 6 hours 04/15/24 0529 04/18/24 1209   04/11/24 1600  ceFAZolin  (ANCEF ) 2-4 GM/100ML-% IVPB       Note to Pharmacy: Atanacio Race: cabinet override      04/11/24 1600 04/12/24 0414      Consults: Treatment Team:  Md, Trauma, MD    Studies:    Events:  Subjective:    Overnight Issues:  NAEO. A&Ox4 (full name, [place], 2025, president trump) Reports neck and back discomfort from the bed Has been walking to the bathroom.  States she ate chicke, peas, gravy for dinner. +BMs Objective:  Vital signs for last 24 hours: Temp:  [98.2 F (36.8 C)-98.8 F (37.1 C)] 98.3 F (36.8 C) (11/21 0810) Pulse Rate:  [72-94] 90 (11/21 0810) Resp:  [16-22] 20 (11/21 0810) BP: (106-167)/(43-90) 167/65 (11/21 0810) SpO2:  [93 %-100 %] 97 % (11/21 0810) Weight:  [63.6 kg] 63.6 kg (11/21 0500)  Hemodynamic parameters for last 24 hours:    Intake/Output from previous day: 11/20 0701 - 11/21 0700 In: 927.1 [P.O.:360; I.V.:42.1; NG/GT:525] Out: -   Intake/Output this  shift: No intake/output data recorded.  Vent settings for last 24 hours:    Physical Exam:  Gen - alert, NAD CV: RRR, no lower extremity edema Pulm: normal effort, CTAB Abd: soft, +BS, nontender Ext: no edema, scattered ecchymosis Neuro: nonfocal exam Psych: A&Ox4  Results for orders placed or performed during the hospital encounter of 04/09/24 (from the past 24 hours)  Glucose, capillary     Status: Abnormal   Collection Time: 04/19/24 11:04 AM  Result Value Ref Range   Glucose-Capillary 143 (H) 70 - 99 mg/dL  Glucose, capillary     Status: Abnormal   Collection Time: 04/19/24  3:51 PM  Result Value Ref Range   Glucose-Capillary 143 (H) 70 - 99 mg/dL  Glucose, capillary     Status: Abnormal   Collection Time: 04/19/24  7:46 PM  Result Value Ref Range   Glucose-Capillary 199 (H) 70 - 99 mg/dL  Glucose, capillary     Status: Abnormal   Collection Time: 04/19/24 11:11 PM  Result Value Ref Range   Glucose-Capillary 201 (H) 70 - 99 mg/dL  Phosphorus     Status: None   Collection Time: 04/20/24  1:50 AM  Result Value Ref Range   Phosphorus 3.0 2.5 - 4.6 mg/dL  Basic metabolic panel with GFR     Status: Abnormal   Collection Time: 04/20/24  1:50 AM  Result Value Ref Range   Sodium 141 135 - 145 mmol/L   Potassium 3.4 (L) 3.5 - 5.1 mmol/L   Chloride 105 98 - 111 mmol/L   CO2 23 22 - 32 mmol/L   Glucose, Bld 175 (H) 70 - 99 mg/dL   BUN 21 8 - 23 mg/dL   Creatinine, Ser 9.45 0.44 - 1.00 mg/dL   Calcium  8.8 (L) 8.9 - 10.3 mg/dL   GFR, Estimated >39 >39 mL/min   Anion gap 13 5 - 15  Glucose, capillary     Status: Abnormal   Collection Time: 04/20/24  3:20 AM  Result Value Ref Range   Glucose-Capillary 170 (H) 70 - 99 mg/dL  Glucose, capillary     Status: Abnormal   Collection Time: 04/20/24  8:05 AM  Result Value Ref Range   Glucose-Capillary 102 (H) 70 - 99 mg/dL    Assessment & Plan: Present on Admission:  SDH (subdural hematoma) (HCC)    LOS: 11 days    Additional comments:I reviewed the patient's new clinical lab test results. / 83 y/o F GLF R posterior scalp hematoma L subdural hematoma with 3mm midline shift - per Dr. Lanis, keppra , F/U CT H stable 11/10, neuro status change 11/12>>  repeat CTH with increased left SDH with increased midline shift and early uncal herniation. Patient moved to ICU for monitoring, Dr. Lanis notified and patient underwent L temporo-parietal craniotomy 11/12. On nicardipine  gtt for BP control. Neuro changes overnight. Repeat CTH done. NSGY has seen. EEG noted. Exam much better. TBI team  therapies. Acute hypoxic respiratory failure - extubated 11/11. Reintubated 11/16 for hypoxia on NRB. Resp cxs pending, on empiric unasyn  11/16 for possible aspiration. Resp cx with NRF, dcd unasyn  Afib/RVR - back in sinus, echo done HTN/freq PVCs - added Coreg  and losartan  11/18,  increased coreg  to 25BID and added norvasc  11/19, add scheduled hydralazine  11/20, cleviprex  off and moved to NP 11/20 HLD DM - SSI R hand swelling - no fx on xray FEN - reg diet; plan to D/C cortrak today if does well with breakfast/lunch VTE - SCD's, LMWH ID - none Foley- foley replaced 11/14 for evident retention Dispo - 4NP,dispo planning to CIR - family looking into support after D/C   Almarie Pringle, [facility] Please see Amion for pager number during day hours 7:00am-4:30pm   04/20/2024  *Care during the described time interval was provided by me. I have reviewed this patient's available data, including medical history, events of note, physical examination and test results as part of my evaluation.

## 2024-04-20 NOTE — Progress Notes (Signed)
 IP rehab admissions - I have faxed information to insurance carrier to open the case for acute inpatient rehab admission.  I will update all once I hear back from insurance case manager.  [phone]

## 2024-04-20 NOTE — Plan of Care (Signed)
   Problem: Health Behavior/Discharge Planning: Goal: Ability to manage health-related needs will improve Outcome: Progressing

## 2024-04-20 NOTE — Progress Notes (Signed)
 Cortrak removed as ordered. Patient tolerated well.

## 2024-04-20 NOTE — TOC Progression Note (Signed)
 Transition of Care [facility]) - Progression Note    Patient Details  Name: AMARRIA ANDREASEN MRN: [id number] Date of Birth: [birth year]  Transition of Care [facility]) CM/SW Contact  Carmelita FORBES Carbon, LCSW Phone Number: 04/20/2024, 12:59 PM  Clinical Narrative:   ICM continues to follow for CIR eligibility.  Could also consider SNF as back up plan once Cortrak is out.     Expected Discharge Plan: IP Rehab Facility Barriers to Discharge: Continued Medical Work up               Expected Discharge Plan and Services   Discharge Planning Services: CM Consult Post Acute Care Choice: IP Rehab Living arrangements for the past 2 months: Single Family Home                                       Social Drivers of Health (SDOH) Interventions SDOH Screenings   Tobacco Use: Low Risk  (04/11/2024)    Readmission Risk Interventions     No data to display

## 2024-04-21 LAB — GLUCOSE, CAPILLARY
Glucose-Capillary: 118 mg/dL — ABNORMAL HIGH (ref 70–99)
Glucose-Capillary: 223 mg/dL — ABNORMAL HIGH (ref 70–99)
Glucose-Capillary: 245 mg/dL — ABNORMAL HIGH (ref 70–99)
Glucose-Capillary: 137 mg/dL — ABNORMAL HIGH (ref 70–99)
Glucose-Capillary: 134 mg/dL — ABNORMAL HIGH (ref 70–99)

## 2024-04-21 MED ORDER — INSULIN ASPART 100 UNIT/ML IJ SOLN: 0.0000 [IU] | Freq: Three times a day (TID) | INTRAMUSCULAR | Status: DC

## 2024-04-21 MED ORDER — POLYETHYLENE GLYCOL 3350 17 G PO PACK: 17.0000 g | PACK | Freq: Every day | ORAL | Status: DC | PRN

## 2024-04-21 MED ORDER — INSULIN ASPART 100 UNIT/ML IJ SOLN
0.0000 [IU] | Freq: Three times a day (TID) | INTRAMUSCULAR | Status: DC
  Administered 2024-04-21: 2 [IU] via SUBCUTANEOUS
  Administered 2024-04-22: 5 [IU] via SUBCUTANEOUS
  Administered 2024-04-22: 2 [IU] via SUBCUTANEOUS
  Filled 2024-04-21: qty 2
  Filled 2024-04-21: qty 5
  Filled 2024-04-21: qty 2

## 2024-04-21 NOTE — Progress Notes (Signed)
 Patient ID: Jeanne Ellison, female   DOB: August 20, 1940, 83 y.o.   MRN: [id number] Follow up - Trauma Critical Care   Patient Details:    Jeanne Ellison is an 83 y.o. female.  Lines/tubes : PICC Double Lumen 04/13/24 Right Basilic 37 cm 0 cm (Active)  Indication for Insertion or Continuance of Line Prolonged intravenous therapies 04/19/24 0400  Exposed Catheter (cm) 0 cm 04/13/24 1201  Site Assessment Clean, Dry, Intact 04/19/24 0400  Lumen #1 Status Infusing 04/19/24 0400  Lumen #2 Status Saline locked;Flushed 04/19/24 0400  Dressing Type Transparent 04/19/24 0400  Dressing Status Clean, Dry, Intact 04/19/24 0400  Line Care Zeroed and calibrated 04/18/24 0800  Line Adjustment (NICU/IV Team Only) No 04/15/24 0800  Dressing Intervention New dressing 04/13/24 1201  Dressing Change Due 04/20/24 04/18/24 0800     Arterial Line 04/11/24 Right Radial (Active)  Site Assessment Clean, Dry, Intact 04/19/24 0400  Line Status Pulsatile blood flow 04/19/24 0400  Art Line Waveform Appropriate 04/19/24 0400  Art Line Interventions Zeroed and calibrated 04/18/24 2000  Color/Movement/Sensation Capillary refill less than 3 sec 04/18/24 2000  Dressing Type Transparent 04/19/24 0400  Dressing Status Clean, Dry, Intact 04/19/24 0400  Dressing Change Due 04/18/24 04/18/24 0800    Microbiology/Sepsis markers: Results for orders placed or performed during the hospital encounter of 04/09/24  MRSA Next Gen by PCR, Nasal     Status: None   Collection Time: 04/09/24 12:21 PM   Specimen: Nasal Mucosa; Nasal Swab  Result Value Ref Range Status   MRSA by PCR Next Gen NOT DETECTED NOT DETECTED Final    Comment: (NOTE) The GeneXpert MRSA Assay (FDA approved for NASAL specimens only), is one component of a comprehensive MRSA colonization surveillance program. It is not intended to diagnose MRSA infection nor to guide or monitor treatment for MRSA infections. Test performance is not FDA approved in  patients less than 31 years old. Performed at [facility], [address], [place], [place] [address]   Culture, Respiratory w Gram Stain     Status: None   Collection Time: 04/15/24 10:41 AM   Specimen: Tracheal Aspirate; Respiratory  Result Value Ref Range Status   Specimen Description TRACHEAL ASPIRATE  Final   Special Requests NONE  Final   Gram Stain   Final    NO WBC SEEN FEW GRAM POSITIVE COCCI RARE GRAM NEGATIVE RODS    Culture   Final    Normal respiratory flora-no Staph aureus or Pseudomonas seen Performed at [facility], [address], [place], [place] [address]    Report Status 04/17/2024 FINAL  Final    Anti-infectives:  Anti-infectives (From admission, onward)    Start     Dose/Rate Route Frequency Ordered Stop   04/15/24 0615  Ampicillin -Sulbactam (UNASYN ) 3 g in sodium chloride  0.9 % 100 mL IVPB  Status:  Discontinued        3 g 200 mL/hr over 30 Minutes Intravenous Every 6 hours 04/15/24 0529 04/18/24 1209   04/11/24 1600  ceFAZolin  (ANCEF ) 2-4 GM/100ML-% IVPB       Note to Pharmacy: Atanacio Race: cabinet override      04/11/24 1600 04/12/24 0414      Consults: Treatment Team:  Md, Trauma, MD    Studies:    Events:  Subjective:    Overnight Issues:  NAEO. A&Ox4 (full name, [place], 2025, president trump) Core track removed yesterday and eating most of her meals.  States bowel movements  are becoming more solid.  Working with therapies. Objective:  Vital signs for last 24 hours: Temp:  [98.2 F (36.8 C)-98.7 F (37.1 C)] (P) 98.3 F (36.8 C) (11/22 0740) Pulse Rate:  [79-91] 88 (11/22 0710) Resp:  [16-20] (P) 19 (11/22 0740) BP: (127-153)/(47-123) 142/56 (11/22 0400) SpO2:  [90 %-96 %] 95 % (11/22 0710) Weight:  [65.4 kg] 65.4 kg (11/22 0500)  Hemodynamic parameters for last 24 hours:    Intake/Output from previous day: No intake/output data recorded.  Intake/Output this shift: No intake/output data  recorded.  Vent settings for last 24 hours:    Physical Exam:  Gen - alert, NAD CV: RRR, no lower extremity edema Pulm: normal effort, CTAB Abd: soft, +BS, nontender Ext: no edema, scattered ecchymosis; interval removal of PICC line Neuro: nonfocal exam Psych: A&Ox4  Results for orders placed or performed during the hospital encounter of 04/09/24 (from the past 24 hours)  Glucose, capillary     Status: Abnormal   Collection Time: 04/20/24 11:49 AM  Result Value Ref Range   Glucose-Capillary 159 (H) 70 - 99 mg/dL  Glucose, capillary     Status: Abnormal   Collection Time: 04/20/24  4:40 PM  Result Value Ref Range   Glucose-Capillary 140 (H) 70 - 99 mg/dL  Glucose, capillary     Status: Abnormal   Collection Time: 04/20/24  7:50 PM  Result Value Ref Range   Glucose-Capillary 174 (H) 70 - 99 mg/dL   Comment 1 Notify RN    Comment 2 Document in Chart   Glucose, capillary     Status: Abnormal   Collection Time: 04/20/24 11:35 PM  Result Value Ref Range   Glucose-Capillary 125 (H) 70 - 99 mg/dL   Comment 1 Notify RN    Comment 2 Document in Chart   Glucose, capillary     Status: Abnormal   Collection Time: 04/21/24  3:42 AM  Result Value Ref Range   Glucose-Capillary 118 (H) 70 - 99 mg/dL   Comment 1 Notify RN    Comment 2 Document in Chart   Glucose, capillary     Status: Abnormal   Collection Time: 04/21/24  7:39 AM  Result Value Ref Range   Glucose-Capillary 223 (H) 70 - 99 mg/dL    Assessment & Plan: Present on Admission:  SDH (subdural hematoma) (HCC)    LOS: 12 days   Additional comments:I reviewed the patient's new clinical lab test results. / 83 y/o F GLF R posterior scalp hematoma L subdural hematoma with 3mm midline shift - per Dr. Lanis, keppra , F/U CT H stable 11/10, neuro status change 11/12>>  repeat CTH with increased left SDH with increased midline shift and early uncal herniation. Patient moved to ICU for monitoring, Dr. Lanis notified and  patient underwent L temporo-parietal craniotomy 11/12. On nicardipine  gtt for BP control. Neuro changes overnight. Repeat CTH done. NSGY has seen. EEG noted. Exam much better. TBI team therapies. Acute hypoxic respiratory failure - extubated 11/11. Reintubated 11/16 for hypoxia on NRB. Resp cxs pending, on empiric unasyn  11/16 for possible aspiration. Resp cx with NRF, dcd unasyn  Afib/RVR - back in sinus, echo done HTN/freq PVCs - added Coreg  and losartan  11/18,  increased coreg  to 25BID and added norvasc  11/19, add scheduled hydralazine  11/20, cleviprex  off and moved to NP 11/20 HLD DM - SSI R hand swelling - no fx on xray FEN - reg diet; discontinue scheduled MiraLAX  and continue daily fiber VTE - SCD's, LMWH ID - none Foley-  foley removed 11/20, spontaneous voids Dispo - 4NP,dispo planning to CIR, rehab following for possible admission   Almarie Pringle, [facility] Please see Amion for pager number during day hours 7:00am-4:30pm   04/21/2024  *Care during the described time interval was provided by me. I have reviewed this patient's available data, including medical history, events of note, physical examination and test results as part of my evaluation.

## 2024-04-21 NOTE — Plan of Care (Signed)
  Problem: Education: Goal: Knowledge of General Education information will improve Description: Including pain rating scale, medication(s)/side effects and non-pharmacologic comfort measures Outcome: Progressing   Problem: Clinical Measurements: Goal: Will remain free from infection Outcome: Progressing Goal: Respiratory complications will improve Outcome: Progressing   Problem: Activity: Goal: Risk for activity intolerance will decrease Outcome: Progressing   Problem: Pain Managment: Goal: General experience of comfort will improve and/or be controlled Outcome: Progressing

## 2024-04-21 NOTE — Progress Notes (Signed)
 IP rehab admissions - We have approval for inpatient rehab admission.  I do not have a bed open today for patient.  Will follow up tomorrow and/or Monday for bed availability.  [phone]

## 2024-04-22 ENCOUNTER — Other Ambulatory Visit: Payer: Self-pay

## 2024-04-22 ENCOUNTER — Encounter (HOSPITAL_COMMUNITY): Payer: Self-pay | Admitting: Physical Medicine and Rehabilitation

## 2024-04-22 DIAGNOSIS — J9691 Respiratory failure, unspecified with hypoxia: Secondary | ICD-10-CM | POA: Diagnosis not present

## 2024-04-22 DIAGNOSIS — N390 Urinary tract infection, site not specified: Secondary | ICD-10-CM | POA: Diagnosis not present

## 2024-04-22 DIAGNOSIS — S065XAD Traumatic subdural hemorrhage with loss of consciousness status unknown, subsequent encounter: Secondary | ICD-10-CM | POA: Diagnosis not present

## 2024-04-22 DIAGNOSIS — Z79899 Other long term (current) drug therapy: Secondary | ICD-10-CM | POA: Diagnosis not present

## 2024-04-22 DIAGNOSIS — I10 Essential (primary) hypertension: Secondary | ICD-10-CM | POA: Diagnosis present

## 2024-04-22 DIAGNOSIS — Z7984 Long term (current) use of oral hypoglycemic drugs: Secondary | ICD-10-CM | POA: Diagnosis not present

## 2024-04-22 DIAGNOSIS — E440 Moderate protein-calorie malnutrition: Secondary | ICD-10-CM | POA: Diagnosis present

## 2024-04-22 DIAGNOSIS — S069XAA Unspecified intracranial injury with loss of consciousness status unknown, initial encounter: Secondary | ICD-10-CM | POA: Diagnosis present

## 2024-04-22 DIAGNOSIS — S069XAD Unspecified intracranial injury with loss of consciousness status unknown, subsequent encounter: Secondary | ICD-10-CM | POA: Diagnosis present

## 2024-04-22 DIAGNOSIS — J811 Chronic pulmonary edema: Secondary | ICD-10-CM | POA: Diagnosis not present

## 2024-04-22 DIAGNOSIS — E119 Type 2 diabetes mellitus without complications: Secondary | ICD-10-CM | POA: Diagnosis not present

## 2024-04-22 DIAGNOSIS — I4891 Unspecified atrial fibrillation: Secondary | ICD-10-CM | POA: Diagnosis present

## 2024-04-22 DIAGNOSIS — D62 Acute posthemorrhagic anemia: Secondary | ICD-10-CM | POA: Diagnosis present

## 2024-04-22 DIAGNOSIS — W1830XD Fall on same level, unspecified, subsequent encounter: Secondary | ICD-10-CM | POA: Diagnosis not present

## 2024-04-22 DIAGNOSIS — Z90710 Acquired absence of both cervix and uterus: Secondary | ICD-10-CM | POA: Diagnosis not present

## 2024-04-22 DIAGNOSIS — K219 Gastro-esophageal reflux disease without esophagitis: Secondary | ICD-10-CM | POA: Diagnosis present

## 2024-04-22 DIAGNOSIS — S065XAA Traumatic subdural hemorrhage with loss of consciousness status unknown, initial encounter: Secondary | ICD-10-CM | POA: Diagnosis present

## 2024-04-22 DIAGNOSIS — J9601 Acute respiratory failure with hypoxia: Secondary | ICD-10-CM | POA: Diagnosis not present

## 2024-04-22 DIAGNOSIS — K589 Irritable bowel syndrome without diarrhea: Secondary | ICD-10-CM | POA: Diagnosis not present

## 2024-04-22 DIAGNOSIS — R519 Headache, unspecified: Secondary | ICD-10-CM | POA: Diagnosis present

## 2024-04-22 DIAGNOSIS — F419 Anxiety disorder, unspecified: Secondary | ICD-10-CM | POA: Diagnosis not present

## 2024-04-22 DIAGNOSIS — R300 Dysuria: Secondary | ICD-10-CM | POA: Diagnosis not present

## 2024-04-22 DIAGNOSIS — E785 Hyperlipidemia, unspecified: Secondary | ICD-10-CM | POA: Diagnosis present

## 2024-04-22 DIAGNOSIS — K5900 Constipation, unspecified: Secondary | ICD-10-CM | POA: Diagnosis not present

## 2024-04-22 DIAGNOSIS — R339 Retention of urine, unspecified: Secondary | ICD-10-CM | POA: Diagnosis present

## 2024-04-22 DIAGNOSIS — R739 Hyperglycemia, unspecified: Secondary | ICD-10-CM | POA: Diagnosis not present

## 2024-04-22 DIAGNOSIS — B961 Klebsiella pneumoniae [K. pneumoniae] as the cause of diseases classified elsewhere: Secondary | ICD-10-CM | POA: Diagnosis not present

## 2024-04-22 LAB — GLUCOSE, CAPILLARY
Glucose-Capillary: 239 mg/dL — ABNORMAL HIGH (ref 70–99)
Glucose-Capillary: 118 mg/dL — ABNORMAL HIGH (ref 70–99)
Glucose-Capillary: 137 mg/dL — ABNORMAL HIGH (ref 70–99)
Glucose-Capillary: 168 mg/dL — ABNORMAL HIGH (ref 70–99)

## 2024-04-22 LAB — BASIC METABOLIC PANEL WITH GFR
Anion gap: 8 (ref 5–15)
BUN: 9 mg/dL (ref 8–23)
CO2: 24 mmol/L (ref 22–32)
Calcium: 8.8 mg/dL — ABNORMAL LOW (ref 8.9–10.3)
Chloride: 108 mmol/L (ref 98–111)
Creatinine, Ser: 0.46 mg/dL (ref 0.44–1.00)
GFR, Estimated: >60 mL/min (ref >60)
Glucose, Bld: 129 mg/dL — ABNORMAL HIGH (ref 70–99)
Potassium: 3.8 mmol/L (ref 3.5–5.1)
Sodium: 140 mmol/L (ref 135–145)

## 2024-04-22 MED ORDER — MELATONIN 3 MG PO TABS: 3.0000 mg | ORAL_TABLET | Freq: Every day | ORAL | Status: DC

## 2024-04-22 MED ORDER — DOCUSATE SODIUM 50 MG/5ML PO LIQD
100.0000 mg | Freq: Two times a day (BID) | ORAL | Status: DC
  Administered 2024-04-23 – 2024-04-26 (×8): 100 mg via ORAL
  Filled 2024-04-22 (×10): qty 10

## 2024-04-22 MED ORDER — MECLIZINE HCL 12.5 MG PO TABS: 12.5000 mg | ORAL_TABLET | Freq: Three times a day (TID) | ORAL | Status: DC | PRN

## 2024-04-22 MED ADMIN — Hydralazine HCl Tab 50 MG: 50 mg | ORAL | NDC 23155083401

## 2024-04-22 MED ADMIN — Rosuvastatin Calcium Tab 5 MG: 10 mg | ORAL | NDC 65862029390

## 2024-04-22 MED ADMIN — Insulin Aspart Inj Soln 100 Unit/ML: 3 [IU] | SUBCUTANEOUS | NDC 73070010011

## 2024-04-22 MED ADMIN — Enoxaparin Sodium Inj Soln Pref Syr 30 MG/0.3ML: 30 mg | SUBCUTANEOUS | NDC 71288043280

## 2024-04-22 MED ADMIN — Melatonin Tab 3 MG: 3 mg | ORAL | NDC 3160402741

## 2024-04-22 MED ADMIN — Tramadol HCl Tab 50 MG: 50 mg | ORAL | NDC 60687079511

## 2024-04-22 MED FILL — Cholecalciferol Tab 25 MCG (1000 Unit): 1000.0000 [IU] | ORAL | Qty: 1 | Status: AC

## 2024-04-22 MED FILL — Melatonin Tab 3 MG: 3.0000 mg | ORAL | Qty: 1 | Status: AC

## 2024-04-22 MED FILL — Acetaminophen Tab 500 MG: 1000.0000 mg | ORAL | Qty: 2 | Status: AC

## 2024-04-22 MED FILL — Acetaminophen Tab 500 MG: 1000.0000 mg | ORAL | Qty: 2 | Status: CN

## 2024-04-22 MED FILL — Rosuvastatin Calcium Tab 5 MG: 10.0000 mg | ORAL | Qty: 2 | Status: AC

## 2024-04-22 MED FILL — Amlodipine Besylate Tab 10 MG (Base Equivalent): 10.0000 mg | ORAL | Qty: 1 | Status: AC

## 2024-04-22 MED FILL — Thiamine Mononitrate Tab 100 MG: 100.0000 mg | ORAL | Qty: 1 | Status: AC

## 2024-04-22 MED FILL — Insulin Aspart Inj Soln 100 Unit/ML: 0.0000 [IU] | INTRAMUSCULAR | Qty: 1 | Status: AC

## 2024-04-22 MED FILL — Insulin Aspart Inj Soln 100 Unit/ML: 0.0000 [IU] | INTRAMUSCULAR | Qty: 3 | Status: AC

## 2024-04-22 MED FILL — Insulin Aspart Inj Soln 100 Unit/ML: 0.0000 [IU] | INTRAMUSCULAR | Qty: 2 | Status: AC

## 2024-04-22 MED FILL — Insulin Aspart Inj Soln 100 Unit/ML: 0.0000 [IU] | INTRAMUSCULAR | Qty: 5 | Status: AC

## 2024-04-22 MED FILL — Multiple Vitamin Tab: 1.0000 | ORAL | Qty: 1 | Status: AC

## 2024-04-22 MED FILL — Carvedilol Tab 25 MG: 25.0000 mg | ORAL | Qty: 1 | Status: AC

## 2024-04-22 MED FILL — Losartan Potassium Tab 50 MG: 100.0000 mg | ORAL | Qty: 2 | Status: AC

## 2024-04-22 MED FILL — Citalopram Hydrobromide Tab 20 MG (Base Equiv): 20.0000 mg | ORAL | Qty: 1 | Status: AC

## 2024-04-22 MED FILL — Enoxaparin Sodium Inj Soln Pref Syr 30 MG/0.3ML: 30.0000 mg | INTRAMUSCULAR | Qty: 0.3 | Status: AC

## 2024-04-22 MED FILL — Hydralazine HCl Tab 50 MG: 50.0000 mg | ORAL | Qty: 1 | Status: AC

## 2024-04-22 MED FILL — Psyllium Powder Packet 95%: 1.0000 | ORAL | Qty: 1 | Status: AC

## 2024-04-22 MED FILL — Psyllium Powder Packet 95%: 1.0000 | ORAL | Qty: 1 | Status: CN

## 2024-04-22 MED FILL — Tramadol HCl Tab 50 MG: 50.0000 mg | ORAL | Qty: 1 | Status: AC

## 2024-04-22 MED FILL — Dextromethorphan-Guaifenesin Syrup 10-100 MG/5ML: 10.0000 mL | ORAL | Qty: 10 | Status: AC

## 2024-04-22 MED FILL — Ascorbic Acid Tab 500 MG: 500.0000 mg | ORAL | Qty: 1 | Status: AC

## 2024-04-22 MED FILL — Methocarbamol Tab 500 MG: 500.0000 mg | ORAL | Qty: 1 | Status: CN

## 2024-04-22 NOTE — Progress Notes (Signed)
 Handoff given to [employer]. Patient discharged from 4NP08 and transferred to 4W04. Jeanne Ellison patient friend at bedside. Personal belongings sent with patient. No PIV access in place. Transport completed with no complications.

## 2024-04-22 NOTE — H&P (Signed)
 Physical Medicine and Rehabilitation Admission H&P     No chief complaint on file. : HPI: Jeanne Ellison is a 83 y.o. female with a PMHx of vertigo, HTN, DM2, anxiety, HLD, arthritis, and GERD, who presented to [facility] on 04/09/24 after ground level found, found down with LKW around 8am. Was confused and agitated, intubated in the ED, and work up revealed L holohemispheric SDH with 3mm midline shift, no associated skull fx. CT C/A/P neg for acute injury, just showed atherosclerosis, colonic diverticulosis, hiatal hernia, and mild cardiomegaly. She was extubated 04/10/24, however on 04/11/24 she became more confused and repeat CTH showing increased L SDH with increased midline shift, therefore she underwent L temporoparietal craniotomy by Dr. Lanis. Initially improving however on 04/15/24 she developed hypoxia and Afib with RVR, was reintubated, and PCCM consulted. Afib RVR treated with Amio but converted back to NSR post intubation. CXR with pulm edema, treated empirically for aspiration with Unasyn , which was d/c'd 11/19 after improvement/neg respiratory cultures. Also treated with intermittent Lasix , improved, extubated again on 04/16/24. EEGs negative for seizure. Completed 14d of Keppra  total, d/c'd on admission to CIR.  Hospital course also complicated by urinary retention, foley replaced 11/14-20, but has been voiding spontaneously since then. Cortrak d/c'd 11/21. BPs requiring IV Clevidipine  but off since 11/20, now managed well with PO meds. Patient did endorse having vertigo which was chronic. Patient requiring comprehensive rehabilitation program due to functional decline.      ROS     Past Medical History:  Diagnosis Date   Diabetes mellitus without complication (HCC)      Type II   Hypertension               Past Surgical History:  Procedure Laterality Date   ABDOMINAL HYSTERECTOMY       APPENDECTOMY       CRANIOTOMY Left 04/11/2024    Procedure: CRANIOTOMY HEMATOMA  EVACUATION SUBDURAL;  Surgeon: Lanis Pupa, MD;  Location: [facility] OR;  Service: Neurosurgery;  Laterality: Left;        History reviewed. No pertinent family history.     Social History:  reports that she has never smoked. She has never used smokeless tobacco. She reports that she does not currently use alcohol. She reports that she does not use drugs. Allergies:  Allergies      Allergies  Allergen Reactions   Sulfa Antibiotics              Medications Prior to Admission  Medication Sig Dispense Refill   ALPRAZolam (XANAX) 1 MG tablet Take 0.5-1 mg by mouth 2 (two) times daily as needed.       Ascorbic Acid  (VITAMIN C  PO) Take 2 tablets by mouth daily.       bisoprolol -hydrochlorothiazide  (ZIAC ) 2.5-6.25 MG tablet Take 1 tablet by mouth daily.       Cholecalciferol  (VITAMIN D3 PO) Take 1-2 tablets by mouth daily.       citalopram  (CELEXA ) 20 MG tablet Take 20 mg by mouth daily.       FOLIC ACID PO Take 1-2 tablets by mouth daily.       metFORMIN (GLUCOPHAGE-XR) 750 MG 24 hr tablet Take 750-1,500 mg by mouth daily.       rosuvastatin  (CRESTOR ) 10 MG tablet Take 10 mg by mouth at bedtime.       Turmeric (QC TUMERIC COMPLEX PO) Take 1 tablet by mouth daily.       VITAMIN E PO Take 1 tablet by mouth daily.  Home: Home Living Family/patient expects to be discharged to:: Private residence Living Arrangements: Spouse/significant other Available Help at Discharge: Family, Available 24 hours/day Type of Home: House Home Access: Stairs to enter Secretary/administrator of Steps: 3 Entrance Stairs-Rails: Left, Right Home Layout: One level Bathroom Shower/Tub: Engineer, Manufacturing Systems: Standard Bathroom Accessibility: Yes Home Equipment: Agricultural Consultant (2 wheels), [employer] - single point  Lives With: Spouse   Functional History: Prior Function Prior Level of Function : Independent/Modified Independent   Functional Status:  Mobility: Bed  Mobility Overal bed mobility: Needs Assistance Bed Mobility: Sit to Supine, Supine to Sit Rolling: Min assist Supine to sit: Min assist Sit to supine: Min assist General bed mobility comments: assist for trunk and LE management, pt able to boost self up in bed with HOB flat. Transfers Overall transfer level: Needs assistance Equipment used: Rolling walker (2 wheels) Transfers: Sit to/from Stand Sit to Stand: Min assist Bed to/from chair/wheelchair/BSC transfer type:: Step pivot Step pivot transfers: Mod assist, +2 physical assistance, +2 safety/equipment General transfer comment: slow to rise, light boost up assist Ambulation/Gait Ambulation/Gait assistance: Min assist Gait Distance (Feet): 125 Feet Assistive device: Rolling walker (2 wheels) Gait Pattern/deviations: Step-through pattern, Decreased stride length, Trunk flexed General Gait Details: assist to steady, cues for proximity to RW and upright posture Gait velocity: decr   ADL: ADL Overall ADL's : Needs assistance/impaired Eating/Feeding: Moderate assistance Grooming: Contact guard assist, Standing, Wash/dry hands, Wash/dry face Upper Body Bathing: Maximal assistance Lower Body Bathing: Maximal assistance, +2 for physical assistance, +2 for safety/equipment Upper Body Dressing : Moderate assistance Lower Body Dressing: Maximal assistance, +2 for physical assistance, +2 for safety/equipment Toilet Transfer: Minimal assistance, Ambulation, Rolling walker (2 wheels), Regular Toilet Toilet Transfer Details (indicate cue type and reason): pivotal stepping only, pt complained of dizziness on standing Toileting- Clothing Manipulation and Hygiene: Contact guard assist, Sit to/from stand Toileting - Clothing Manipulation Details (indicate cue type and reason): continent bladder at the toilet Functional mobility during ADLs: Minimal assistance, Rolling walker (2 wheels) General ADL Comments: limited by balance, cognition and  activity tolerance   Cognition: Cognition Overall Cognitive Status: Within Functional Limits for tasks assessed Arousal/Alertness: Awake/alert Orientation Level: Oriented X4 Year: 2025 Month: November Day of Week: Correct Attention: Selective, Alternating Focused Attention: Appears intact Selective Attention: Appears intact Alternating Attention: Appears intact Memory: Appears intact Awareness: Appears intact Problem Solving: Impaired Problem Solving Impairment: Verbal basic, Functional basic Executive Function: Reasoning Reasoning: Impaired Reasoning Impairment: Verbal basic, Functional basic Cognition Arousal: Alert Behavior During Therapy: Impulsive Overall Cognitive Status: Within Functional Limits for tasks assessed   Physical Exam: Blood pressure (!) 152/51, pulse 83, temperature 98.2 F (36.8 C), temperature source Oral, resp. rate 16, height 5' 5.5 (1.664 m), weight 65.4 kg, SpO2 96%. Physical Exam HEENT crani site CDI, no fluctuance staples intact General: No acute distress Mood and affect are appropriate Heart: Regular rate and rhythm no rubs murmurs or extra sounds Lungs: Clear to auscultation, breathing unlabored, no rales or wheezes Abdomen: Positive bowel sounds, soft nontender to palpation, nondistended Extremities: No clubbing, cyanosis, or edema Skin: No evidence of breakdown, no evidence of rash Neurologic: Cranial nerves II through XII intact, motor strength is 4/5 in bilateral deltoid, bicep, tricep, grip, hip flexor, knee extensors, ankle dorsiflexor and plantar flexor Sensory exam normal sensation to light touch  in bilateral upper and lower extremities Cerebellar exam normal finger to nose to finger as well as heel to shin in bilateral upper and lower extremities Musculoskeletal:  Full range of motion in all 4 extremities. No joint swelling    Lab Results Last 48 Hours        Results for orders placed or performed during the hospital encounter of  04/09/24 (from the past 48 hours)  Glucose, capillary     Status: Abnormal    Collection Time: 04/20/24  4:40 PM  Result Value Ref Range    Glucose-Capillary 140 (H) 70 - 99 mg/dL      Comment: Glucose reference range applies only to samples taken after fasting for at least 8 hours.  Glucose, capillary     Status: Abnormal    Collection Time: 04/20/24  7:50 PM  Result Value Ref Range    Glucose-Capillary 174 (H) 70 - 99 mg/dL      Comment: Glucose reference range applies only to samples taken after fasting for at least 8 hours.    Comment 1 Notify RN      Comment 2 Document in Chart    Glucose, capillary     Status: Abnormal    Collection Time: 04/20/24 11:35 PM  Result Value Ref Range    Glucose-Capillary 125 (H) 70 - 99 mg/dL      Comment: Glucose reference range applies only to samples taken after fasting for at least 8 hours.    Comment 1 Notify RN      Comment 2 Document in Chart    Glucose, capillary     Status: Abnormal    Collection Time: 04/21/24  3:42 AM  Result Value Ref Range    Glucose-Capillary 118 (H) 70 - 99 mg/dL      Comment: Glucose reference range applies only to samples taken after fasting for at least 8 hours.    Comment 1 Notify RN      Comment 2 Document in Chart    Glucose, capillary     Status: Abnormal    Collection Time: 04/21/24  7:39 AM  Result Value Ref Range    Glucose-Capillary 223 (H) 70 - 99 mg/dL      Comment: Glucose reference range applies only to samples taken after fasting for at least 8 hours.  Glucose, capillary     Status: Abnormal    Collection Time: 04/21/24 11:53 AM  Result Value Ref Range    Glucose-Capillary 245 (H) 70 - 99 mg/dL      Comment: Glucose reference range applies only to samples taken after fasting for at least 8 hours.  Glucose, capillary     Status: Abnormal    Collection Time: 04/21/24  4:17 PM  Result Value Ref Range    Glucose-Capillary 137 (H) 70 - 99 mg/dL      Comment: Glucose reference range applies only to  samples taken after fasting for at least 8 hours.  Glucose, capillary     Status: Abnormal    Collection Time: 04/21/24  9:30 PM  Result Value Ref Range    Glucose-Capillary 134 (H) 70 - 99 mg/dL      Comment: Glucose reference range applies only to samples taken after fasting for at least 8 hours.  Basic metabolic panel     Status: Abnormal    Collection Time: 04/22/24  5:39 AM  Result Value Ref Range    Sodium 140 135 - 145 mmol/L    Potassium 3.8 3.5 - 5.1 mmol/L    Chloride 108 98 - 111 mmol/L    CO2 24 22 - 32 mmol/L    Glucose, Bld 129 (H) 70 -  99 mg/dL      Comment: Glucose reference range applies only to samples taken after fasting for at least 8 hours.    BUN 9 8 - 23 mg/dL    Creatinine, Ser 9.53 0.44 - 1.00 mg/dL    Calcium  8.8 (L) 8.9 - 10.3 mg/dL    GFR, Estimated >39 >39 mL/min      Comment: (NOTE) Calculated using the CKD-EPI Creatinine Equation (2021)      Anion gap 8 5 - 15      Comment: Performed at [facility], [address], [place], [place] [address]  Glucose, capillary     Status: Abnormal    Collection Time: 04/22/24  8:12 AM  Result Value Ref Range    Glucose-Capillary 239 (H) 70 - 99 mg/dL      Comment: Glucose reference range applies only to samples taken after fasting for at least 8 hours.  Glucose, capillary     Status: Abnormal    Collection Time: 04/22/24 11:59 AM  Result Value Ref Range    Glucose-Capillary 118 (H) 70 - 99 mg/dL      Comment: Glucose reference range applies only to samples taken after fasting for at least 8 hours.      Imaging Results (Last 48 hours)  No results found.         Blood pressure (!) 152/51, pulse 83, temperature 98.2 F (36.8 C), temperature source Oral, resp. rate 16, height 5' 5.5 (1.664 m), weight 65.4 kg, SpO2 96%.   Medical Problem List and Plan: 1. Functional deficits secondary to ground level fall with L SDH s/p L frontoparietal craniotomy 04/11/24 by Dr. Lanis:             -patient may   shower with shower cap             -ELOS/Goals: 10-12d sup/mod I             -Start CIR -completed total of 14 days of Keppra , EEGs neg for sz, d/c'd on admission to CIR   2.  Antithrombotics: -DVT/anticoagulation:  Mechanical: Sequential compression devices, below knee Bilateral lower extremities             -Also Lovenox  30mg  BID             -antiplatelet therapy: none   3. Pain Management: tylenol  1000mg  q6h scheduled. Tramadol  and robaxin  PRN. On chart review, pt may have previously been on Celebrex 200mg  BID-- hold for now.    4. Mood/Behavior/Sleep/hx of anxiety:             -antipsychotic agents: none -Melatonin 3mg  nightly for sleep, previously used Xanax 0.5mg  PRN but would hold for now -Continue Celexa  20mg  daily   5. Neuropsych/cognition: This patient is capable of making decisions on her own behalf.   6. Skin/Wound Care: wound care and routine skin care; staples out 11/26 or after   7. Fluids/Electrolytes/Nutrition: monitor I&O and routine labs, continue vitamins/supplements. Cortrak d/c'd 11/20:             -Diet changed to carb mod -Currently has glucerna shakes TID-- per notes, doesn't do well with Ensure  -Nutrition note 11/21 does also mention Mighty shakes TID-- not currently ordered, add as appropriate   8. HTN/Afib with RVR: previously on Bisoprolol -hydrochlorothiazide  2.5-6.25mg  ?0.5tab daily-- not on formulary and has now been switched. Off Cleviprex  since 11/20. Off Amio, back in NSR -Continue Amlodipine  10mg  daily, Carvedilol  25mg  BID, Hydralazine  50mg  TID, Losartan  100mg  daily -  Monitor with increased mobility.    9. HLD: continue Rosuvastatin  10mg  nightly   10. DM2: previously on Metformin XR 750mg  daily, currently held, resume as appropriate. On SSI Moderate scale. Monitor CBGs.    11. Constipation: continue Colace 100mg  BID and psyllium packets BID, Miralax  PRN, also has Sorbitol /Fleet's PRN. LBM 11/23 type 3 so monitor closely.   12. Vertigo: apparently  has hx of this, may have caused the fall             -PT vestibular eval             -Meclizine  12.5mg  TID PRN   13. AHRF/VDRF/pulm edema: intubated 11/10-11 then 11/16-17, treated with Unasyn  but d/c'd 11/19 after resp cx neg and hypoxia improved.             -Incentive Spiro   14. Urinary retention: foley 11/14-20, but voiding spontaneously since; chart review shows ?Flomax previously-- resume if retention recurs/appropriate.    15. ABLA Hgb 7-8, monitor closely, transfuse <7      [address] PA-C 04/22/2024  I have personally performed a face to face diagnostic evaluation of this patient.  Additionally, I have reviewed and concur with the physician assistant's documentation above. Prentice CHARLENA Compton M.D. [facility] Fellow [employer] Diplomate Am Board of Electrodiagnostic Med Fellow Am Board of Interventional Pain

## 2024-04-22 NOTE — Plan of Care (Signed)
  Problem: Education: Goal: Knowledge of General Education information will improve Description: Including pain rating scale, medication(s)/side effects and non-pharmacologic comfort measures Outcome: Progressing   Problem: Clinical Measurements: Goal: Ability to maintain clinical measurements within normal limits will improve Outcome: Progressing Goal: Respiratory complications will improve Outcome: Progressing Goal: Cardiovascular complication will be avoided Outcome: Progressing   Problem: Activity: Goal: Risk for activity intolerance will decrease Outcome: Progressing   Problem: Pain Managment: Goal: General experience of comfort will improve and/or be controlled Outcome: Not Progressing

## 2024-04-22 NOTE — Progress Notes (Signed)
 PMR Admission Coordinator Pre-Admission Assessment   Patient: Jeanne Ellison is an 83 y.o., female MRN: [id number] DOB: 07/06/1940 Height: 5' 5.5 (166.4 cm) Weight: 65.4 kg   Insurance Information HMO:     PPO: yes     PCP:      IPA:      80/20:      OTHER:  PRIMARY: [employer]      Policy#: [id number]      Subscriber: patient CM Name: Marval      Phone#:       Fax#: [phone] Pre-Cert#: [id number] approved by Marval from 04/21/24 to 04/27/24     Employer: Not working Benefits:  Phone #: [phone]     Name:   Eff. Date:       Deduct:        Out of Pocket Max:        Life Max:   CIR:        SNF:   Outpatient:       Co-Pay:   Home Health:        Co-Pay:   DME:       Co-Pay:   Providers: in-network   SECONDARY:       Policy#:      Phone#:    Artist:       Phone#:    The Data Processing Manager" for patients in Inpatient Rehabilitation Facilities with attached "Privacy Act Statement-Health Care Records" was provided and verbally reviewed with: Family   Emergency Contact Information Contact Information       Name Relation Home Work Mobile    Findlay,Roy Spouse [phone]   [phone]         Other Contacts   None on File        Current Medical History  Patient Admitting Diagnosis: SDH   History of Present Illness: An 83 year old female with medical hx significant for: diabetes presented to [facility] on 04/09/24 d/t a fall with AMS. Pt intubated on 11/10. Noted right posterior scalp hematoma and abrasions on left shin. CT head revealed 6 mm left subdural hematoma with 3 mm midline shift. CT chest, abdomen and pelvis negative for acute injury. Neurosurgery consulted. Surgical intervention not recommended. Pt extubated on 11/11. Repeat CT stable. Neuro status changes noted on 11/12. STAT CT head showed increased left SDH to 11 mm with 8 mm increased midline shift and early uncal herniation. Cortrak placed on 11/12. Pt  underwent left temporo-parietal craniotomy for evacuation of SDH by Dr. Lanis on 04/11/24. EEG on 11/14 negative for seizures. Pt re-intubated on 11/16 d/t worsening hypoxia on NRB. Pt developed A-fib with RVR and transient hypotension. OG tube placed. EEG on 11/16 and 11/17 were negative for seizures. Pt extubated on 11/17. Therapy evaluations complete and CIR recommended d/t pt's deficits in functional mobility and aphasia.   Complete NIHSS TOTAL: 2   Patient's medical record from [facility] has been reviewed by the rehabilitation admission coordinator and physician.   Past Medical History      Past Medical History:  Diagnosis Date   Diabetes mellitus without complication (HCC)      Type II   Hypertension            Has the patient had major surgery during 100 days prior to admission? Yes   Family History   family history is not on file.   Current Medications  Current Medications    Current Facility-Administered Medications:  acetaminophen  (TYLENOL ) tablet 1,000 mg, 1,000 mg, Oral, Q6H, Fobbs, Rodericks T, RPH, 1,000 mg at 04/22/24 0444   amLODipine  (NORVASC ) tablet 10 mg, 10 mg, Oral, Daily, Lovick, Dreama SAILOR, MD, 10 mg at 04/22/24 9077   carvedilol  (COREG ) tablet 25 mg, 25 mg, Oral, BID WC, Lovick, Dreama SAILOR, MD, 25 mg at 04/22/24 9077   citalopram  (CELEXA ) tablet 20 mg, 20 mg, Oral, Daily, Fobbs, Rodericks T, RPH, 20 mg at 04/22/24 9077   clevidipine  (CLEVIPREX ) infusion 0.5 mg/mL, 0-21 mg/hr, Intravenous, Continuous, Claudene Toribio BROCKS, MD, Stopped at 04/19/24 1034   docusate (COLACE) 50 MG/5ML liquid 100 mg, 100 mg, Oral, BID, Fobbs, Rodericks T, RPH, 100 mg at 04/20/24 2056   enoxaparin  (LOVENOX ) injection 30 mg, 30 mg, Subcutaneous, Q12H, Sebastian Moles, MD, 30 mg at 04/22/24 0048   feeding supplement (GLUCERNA SHAKE) (GLUCERNA SHAKE) liquid 237 mL, 237 mL, Oral, TID BM, Lovick, Dreama SAILOR, MD, 237 mL at 04/21/24 2149   hydrALAZINE  (APRESOLINE ) tablet 50 mg, 50  mg, Oral, Q8H, Ann Fine, MD, 50 mg at 04/22/24 0447   insulin  aspart (novoLOG ) injection 0-15 Units, 0-15 Units, Subcutaneous, TID AC & HS, Simaan, Elizabeth S, PA-C, 5 Units at 04/22/24 9076   labetalol  (NORMODYNE ) injection 20 mg, 20 mg, Intravenous, Q2H PRN, Paliwal, Aditya, MD, 20 mg at 04/20/24 0850   levETIRAcetam  (KEPPRA ) tablet 500 mg, 500 mg, Oral, BID, Harold Scholz, MD, 500 mg at 04/22/24 9077   lip balm (CARMEX) ointment, , Topical, PRN, Sebastian Moles, MD, 1 Application at 04/17/24 1805   losartan  (COZAAR ) tablet 100 mg, 100 mg, Oral, Daily, Sebastian Moles, MD, 100 mg at 04/22/24 9077   meclizine  (ANTIVERT ) tablet 12.5 mg, 12.5 mg, Oral, TID PRN, Simaan, Elizabeth S, PA-C   melatonin tablet 3 mg, 3 mg, Oral, QHS, Simaan, Elizabeth S, PA-C   multivitamin with minerals tablet 1 tablet, 1 tablet, Oral, Daily, Fobbs, Rodericks T, RPH, 1 tablet at 04/22/24 9077   ondansetron  (ZOFRAN -ODT) disintegrating tablet 4 mg, 4 mg, Oral, Q6H PRN, 4 mg at 04/22/24 0922 **OR** ondansetron  (ZOFRAN ) injection 4 mg, 4 mg, Intravenous, Q6H PRN, Millen, Jessica B, RPH, 4 mg at 04/20/24 0907   Oral care mouth rinse, 15 mL, Mouth Rinse, PRN, Harold Scholz, MD   polyethylene glycol (MIRALAX  / GLYCOLAX ) packet 17 g, 17 g, Oral, Daily PRN, Simaan, Elizabeth S, PA-C   psyllium (HYDROCIL/METAMUCIL) 1 packet, 1 packet, Oral, BID, Simaan, Elizabeth S, PA-C, 1 packet at 04/22/24 9077   rosuvastatin  (CRESTOR ) tablet 10 mg, 10 mg, Oral, QHS, Fobbs, Rodericks T, RPH, 10 mg at 04/21/24 2148   sodium chloride  flush (NS) 0.9 % injection 10-40 mL, 10-40 mL, Intracatheter, Q12H, Sebastian Moles, MD, 10 mL at 04/22/24 0929   sodium chloride  flush (NS) 0.9 % injection 10-40 mL, 10-40 mL, Intracatheter, PRN, Sebastian Moles, MD   [COMPLETED] thiamine  (VITAMIN B1) tablet 500 mg, 500 mg, Per Tube, Q8H, 500 mg at 04/14/24 0227 **FOLLOWED BY** thiamine  (VITAMIN B1) tablet 100 mg, 100 mg, Oral, Daily, Fobbs, Rodericks T, RPH,  100 mg at 04/22/24 9077     Patients Current Diet:  Diet Order                  Diet regular Room service appropriate? Yes with Assist; Fluid consistency: Thin  Diet effective now                         Precautions / Restrictions Precautions Precautions: Fall Precaution/Restrictions Comments:  SBP <150 Restrictions Weight Bearing Restrictions Per Provider Order: No    Has the patient had 2 or more falls or a fall with injury in the past year? Yes   Prior Activity Level Community (5-7x/wk): gets out of house daily   Prior Functional Level Self Care: Did the patient need help bathing, dressing, using the toilet or eating? Independent   Indoor Mobility: Did the patient need assistance with walking from room to room (with or without device)? Independent   Stairs: Did the patient need assistance with internal or external stairs (with or without device)? Independent   Functional Cognition: Did the patient need help planning regular tasks such as shopping or remembering to take medications? Independent   Patient Information Are you of Hispanic, Latino/a,or Spanish origin?: X. Patient unable to respond, A. No, not of Hispanic, Latino/a, or Spanish origin What is your race?: X. Patient unable to respond, A. White Do you need or want an interpreter to communicate with a doctor or health care staff?: 9. Unable to respond Patient information obtained via proxy : son answered questions   Patient's Response To:  Health Literacy and Transportation Is the patient able to respond to health literacy and transportation needs?: No Health Literacy - How often do you need to have someone help you when you read instructions, pamphlets, or other written material from your doctor or pharmacy?: Patient unable to respond Health Literacy and Transportation obtained via proxy: son answered questions (pt has not missed any appointments d/t transportation issues)   Home Assistive Devices /  Equipment Home Equipment: Agricultural Consultant (2 wheels), [employer] - single point   Prior Device Use: Indicate devices/aids used by the patient prior to current illness, exacerbation or injury? cane   Current Functional Level Cognition   Arousal/Alertness: Awake/alert Overall Cognitive Status: Within Functional Limits for tasks assessed Orientation Level: Oriented X4 Attention: Selective, Alternating Focused Attention: Appears intact Selective Attention: Appears intact Alternating Attention: Appears intact Memory: Appears intact Awareness: Appears intact Problem Solving: Impaired Problem Solving Impairment: Verbal basic, Functional basic Executive Function: Reasoning Reasoning: Impaired Reasoning Impairment: Verbal basic, Functional basic    Extremity Assessment (includes Sensation/Coordination)   Upper Extremity Assessment: Defer to OT evaluation RUE Deficits / Details: overall WFL, globally 4/5. coordination WFL for grooming adn toileting LUE Deficits / Details: overall WFL, globally 4/5. coordination WFL for grooming adn toileting  Lower Extremity Assessment: Defer to PT evaluation     ADLs   Overall ADL's : Needs assistance/impaired Eating/Feeding: Moderate assistance Grooming: Contact guard assist, Standing, Wash/dry hands, Wash/dry face Upper Body Bathing: Maximal assistance Lower Body Bathing: Maximal assistance, +2 for physical assistance, +2 for safety/equipment Upper Body Dressing : Moderate assistance Lower Body Dressing: Maximal assistance, +2 for physical assistance, +2 for safety/equipment Toilet Transfer: Minimal assistance, Ambulation, Rolling walker (2 wheels), Regular Toilet Toilet Transfer Details (indicate cue type and reason): pivotal stepping only, pt complained of dizziness on standing Toileting- Clothing Manipulation and Hygiene: Contact guard assist, Sit to/from stand Toileting - Clothing Manipulation Details (indicate cue type and reason): continent bladder at  the toilet Functional mobility during ADLs: Minimal assistance, Rolling walker (2 wheels) General ADL Comments: limited by balance, cognition and activity tolerance     Mobility   Overal bed mobility: Needs Assistance Bed Mobility: Sit to Supine, Supine to Sit Rolling: Min assist Supine to sit: Min assist Sit to supine: Min assist General bed mobility comments: assist for trunk and LE management, pt able to boost self up in bed with  HOB flat.     Transfers   Overall transfer level: Needs assistance Equipment used: Rolling walker (2 wheels) Transfers: Sit to/from Stand Sit to Stand: Min assist Bed to/from chair/wheelchair/BSC transfer type:: Step pivot Step pivot transfers: Mod assist, +2 physical assistance, +2 safety/equipment General transfer comment: slow to rise, light boost up assist     Ambulation / Gait / Stairs / Wheelchair Mobility   Ambulation/Gait Ambulation/Gait assistance: Editor, Commissioning (Feet): 125 Feet Assistive device: Rolling walker (2 wheels) Gait Pattern/deviations: Step-through pattern, Decreased stride length, Trunk flexed General Gait Details: assist to steady, cues for proximity to RW and upright posture Gait velocity: decr     Posture / Balance Dynamic Sitting Balance Sitting balance - Comments: CGA Balance Overall balance assessment: Needs assistance Sitting-balance support: No upper extremity supported, Feet supported Sitting balance-Leahy Scale: Fair Sitting balance - Comments: CGA Standing balance support: Single extremity supported, During functional activity Standing balance-Leahy Scale: Fair Standing balance comment: statically at the sink/toilet     Special considerations/life events  Continuous Drip IV  None, Oxygen None, Skin Surgical Incision: head; Abrasion: arm, leg/bilateral; Ecchymosis: leg/bilateral; Erythema/Redness: arm, leg/bilateral, Bowel and Bladder incontinence, Urethral Catheter, and Diabetic management Novolog  0-15  units every 4 hours    Previous Home Environment (from acute therapy documentation) Living Arrangements: Spouse/significant other  Lives With: Spouse Available Help at Discharge: Family, Available 24 hours/day Type of Home: House Home Layout: One level Home Access: Stairs to enter Entrance Stairs-Rails: Left, Right Entrance Stairs-Number of Steps: 3 Bathroom Shower/Tub: Engineer, Manufacturing Systems: Standard Bathroom Accessibility: Yes How Accessible: Accessible via walker Home Care Services: No   Discharge Living Setting Plans for Discharge Living Setting: Patient's home Type of Home at Discharge: House Discharge Home Layout: One level Discharge Home Access: Stairs to enter Entrance Stairs-Rails: Right, Left Entrance Stairs-Number of Steps: 3 Discharge Bathroom Shower/Tub: Tub/shower unit Discharge Bathroom Toilet: Standard Discharge Bathroom Accessibility: Yes How Accessible: Accessible via walker Does the patient have any problems obtaining your medications?: No   Social/Family/Support Systems Anticipated Caregiver: Gaither Altes (husband) and family Anticipated Caregiver's Contact Information: Gaither: [phone], Christopher (son): [phone] Caregiver Availability: 24/7 Discharge Plan Discussed with Primary Caregiver: Yes Is Caregiver In Agreement with Plan?: Yes Does Caregiver/Family have Issues with Lodging/Transportation while Pt is in Rehab?: No   Goals Patient/Family Goal for Rehab: PT/OT/SLP supervision to mod I goals Expected length of stay: 10-12 days Pt/Family Agrees to Admission and willing to participate: Yes Program Orientation Provided & Reviewed with Pt/Caregiver Including Roles  & Responsibilities: Yes   Decrease burden of Care through IP rehab admission: NA   Possible need for SNF placement upon discharge: Not anticipated   Patient Condition: I have reviewed medical records from [facility], spoken with CM, and patient and family member. I  met with patient at the bedside and discussed via phone for inpatient rehabilitation assessment.  Patient will benefit from ongoing PT, OT, and SLP, can actively participate in 3 hours of therapy a day 5 days of the week, and can make measurable gains during the admission.  Patient will also benefit from the coordinated team approach during an Inpatient Acute Rehabilitation admission.  The patient will receive intensive therapy as well as Rehabilitation physician, nursing, social worker, and care management interventions.  Due to bladder management, bowel management, safety, skin/wound care, disease management, medication administration, pain management, and patient education the patient requires 24 hour a day rehabilitation nursing.  The patient is currently min to mod assist  with mobility and basic ADLs.  Discharge setting and therapy post discharge at home with home health is anticipated.  Patient has agreed to participate in the Acute Inpatient Rehabilitation Program and will admit today.   Preadmission Screen Completed By:  Tinnie SHAUNNA Yvone Delayne, 04/22/2024 12:12 PM and updated by Lugene Ropes, RN on 04/22/24 at 12:10 pm. ______________________________________________________________________   Discussed status with Dr. Carilyn on 04/22/24 at 1200 and received approval for admission today.   Admission Coordinator:  Tinnie SHAUNNA Yvone Delayne, CCC-SLP, time 1210/Date 04/22/24    Assessment/Plan: Diagnosis: Fall with left SDH Does the need for close, 24 hr/day Medical supervision in concert with the patient's rehab needs make it unreasonable for this patient to be served in a less intensive setting? Yes Co-Morbidities requiring supervision/potential complications: s/p left craniotomy for cerebral edema, Afib RVR Due to bladder management, bowel management, safety, skin/wound care, disease management, medication administration, pain management, and patient education, does the patient require 24 hr/day  rehab nursing? Yes Does the patient require coordinated care of a physician, rehab nurse, PT, OT, and SLP to address physical and functional deficits in the context of the above medical diagnosis(es)? Yes Addressing deficits in the following areas: balance, endurance, locomotion, strength, transferring, bowel/bladder control, bathing, dressing, toileting, and cognition Can the patient actively participate in an intensive therapy program of at least 3 hrs of therapy 5 days a week? Yes The potential for patient to make measurable gains while on inpatient rehab is good Anticipated functional outcomes upon discharge from inpatient rehab: modified independent and supervision PT, modified independent and supervision OT, modified independent and supervision SLP Estimated rehab length of stay to reach the above functional goals is: 10-12d Anticipated discharge destination: Home 10. Overall Rehab/Functional Prognosis: good     MD Signature: Prentice CHARLENA Carilyn M.D. [facility] Fellow Am Acad of Phys Med and Rehab Diplomate Am Board of Electrodiagnostic Med Fellow Am Board of Interventional Pain            Revision History

## 2024-04-22 NOTE — Progress Notes (Signed)
 Patient arrived from 4NP08. Denied pain and appears alert. Placed in room [id number].

## 2024-04-22 NOTE — Progress Notes (Signed)
 Patient ID: Jeanne Ellison, female   DOB: 09/10/1940, 83 y.o.   MRN: [id number] Follow up - Trauma Critical Care   Patient Details:    Jeanne Ellison is an 83 y.o. female.  Lines/tubes : PICC Double Lumen 04/13/24 Right Basilic 37 cm 0 cm (Active)  Indication for Insertion or Continuance of Line Prolonged intravenous therapies 04/19/24 0400  Exposed Catheter (cm) 0 cm 04/13/24 1201  Site Assessment Clean, Dry, Intact 04/19/24 0400  Lumen #1 Status Infusing 04/19/24 0400  Lumen #2 Status Saline locked;Flushed 04/19/24 0400  Dressing Type Transparent 04/19/24 0400  Dressing Status Clean, Dry, Intact 04/19/24 0400  Line Care Zeroed and calibrated 04/18/24 0800  Line Adjustment (NICU/IV Team Only) No 04/15/24 0800  Dressing Intervention New dressing 04/13/24 1201  Dressing Change Due 04/20/24 04/18/24 0800     Arterial Line 04/11/24 Right Radial (Active)  Site Assessment Clean, Dry, Intact 04/19/24 0400  Line Status Pulsatile blood flow 04/19/24 0400  Art Line Waveform Appropriate 04/19/24 0400  Art Line Interventions Zeroed and calibrated 04/18/24 2000  Color/Movement/Sensation Capillary refill less than 3 sec 04/18/24 2000  Dressing Type Transparent 04/19/24 0400  Dressing Status Clean, Dry, Intact 04/19/24 0400  Dressing Change Due 04/18/24 04/18/24 0800    Microbiology/Sepsis markers: Results for orders placed or performed during the hospital encounter of 04/09/24  MRSA Next Gen by PCR, Nasal     Status: None   Collection Time: 04/09/24 12:21 PM   Specimen: Nasal Mucosa; Nasal Swab  Result Value Ref Range Status   MRSA by PCR Next Gen NOT DETECTED NOT DETECTED Final    Comment: (NOTE) The GeneXpert MRSA Assay (FDA approved for NASAL specimens only), is one component of a comprehensive MRSA colonization surveillance program. It is not intended to diagnose MRSA infection nor to guide or monitor treatment for MRSA infections. Test performance is not FDA approved in  patients less than 21 years old. Performed at [facility], [address], [place], [place] [address]   Culture, Respiratory w Gram Stain     Status: None   Collection Time: 04/15/24 10:41 AM   Specimen: Tracheal Aspirate; Respiratory  Result Value Ref Range Status   Specimen Description TRACHEAL ASPIRATE  Final   Special Requests NONE  Final   Gram Stain   Final    NO WBC SEEN FEW GRAM POSITIVE COCCI RARE GRAM NEGATIVE RODS    Culture   Final    Normal respiratory flora-no Staph aureus or Pseudomonas seen Performed at [facility], [address] [place], [place] [address]    Report Status 04/17/2024 FINAL  Final    Anti-infectives:  Anti-infectives (From admission, onward)    Start     Dose/Rate Route Frequency Ordered Stop   04/15/24 0615  Ampicillin -Sulbactam (UNASYN ) 3 g in sodium chloride  0.9 % 100 mL IVPB  Status:  Discontinued        3 g 200 mL/hr over 30 Minutes Intravenous Every 6 hours 04/15/24 0529 04/18/24 1209   04/11/24 1600  ceFAZolin  (ANCEF ) 2-4 GM/100ML-% IVPB       Note to Pharmacy: Atanacio Race: cabinet override      04/11/24 1600 04/12/24 0414      Consults: Treatment Team:  Md, Trauma, MD    Studies:    Events:  Subjective:    Overnight Issues:  NAEO. Reports a history of vertigo, which is what she think caused the fall. Previously on meclizine .  Does report mild dizziness with  mobility that is improved compared to before admission.   Objective:  Vital signs for last 24 hours: Temp:  [97.7 F (36.5 C)-98.7 F (37.1 C)] 98.5 F (36.9 C) (11/23 0731) Pulse Rate:  [83-100] 100 (11/23 0245) Resp:  [18-20] 18 (11/23 0731) BP: (128-159)/(44-61) 159/54 (11/23 0731) SpO2:  [95 %-97 %] 95 % (11/23 0731)  Hemodynamic parameters for last 24 hours:    Intake/Output from previous day: No intake/output data recorded.  Intake/Output this shift: No intake/output data recorded.  Vent settings for last 24 hours:    Physical  Exam:  Gen - alert, NAD CV: RRR, no lower extremity edema Pulm: normal effort, CTAB Abd: soft, +BS, nontender Ext: no edema, scattered ecchymosis; interval removal of PICC line Neuro: nonfocal exam Psych: A&Ox4  Results for orders placed or performed during the hospital encounter of 04/09/24 (from the past 24 hours)  Glucose, capillary     Status: Abnormal   Collection Time: 04/21/24 11:53 AM  Result Value Ref Range   Glucose-Capillary 245 (H) 70 - 99 mg/dL  Glucose, capillary     Status: Abnormal   Collection Time: 04/21/24  4:17 PM  Result Value Ref Range   Glucose-Capillary 137 (H) 70 - 99 mg/dL  Glucose, capillary     Status: Abnormal   Collection Time: 04/21/24  9:30 PM  Result Value Ref Range   Glucose-Capillary 134 (H) 70 - 99 mg/dL  Basic metabolic panel     Status: Abnormal   Collection Time: 04/22/24  5:39 AM  Result Value Ref Range   Sodium 140 135 - 145 mmol/L   Potassium 3.8 3.5 - 5.1 mmol/L   Chloride 108 98 - 111 mmol/L   CO2 24 22 - 32 mmol/L   Glucose, Bld 129 (H) 70 - 99 mg/dL   BUN 9 8 - 23 mg/dL   Creatinine, Ser 9.53 0.44 - 1.00 mg/dL   Calcium  8.8 (L) 8.9 - 10.3 mg/dL   GFR, Estimated >39 >39 mL/min   Anion gap 8 5 - 15  Glucose, capillary     Status: Abnormal   Collection Time: 04/22/24  8:12 AM  Result Value Ref Range   Glucose-Capillary 239 (H) 70 - 99 mg/dL    Assessment & Plan: Present on Admission:  SDH (subdural hematoma) (HCC)    LOS: 13 days   Additional comments:I reviewed the patient's new clinical lab test results. / 83 y/o F GLF R posterior scalp hematoma L subdural hematoma with 3mm midline shift - per Dr. Lanis, keppra , F/U CT H stable 11/10, neuro status change 11/12>>  repeat CTH with increased left SDH with increased midline shift and early uncal herniation. Patient moved to ICU for monitoring, Dr. Lanis notified and patient underwent L temporo-parietal craniotomy 11/12. On nicardipine  gtt for BP control. Neuro changes  overnight. Repeat CTH done. NSGY has seen. EEG noted. Exam much better. TBI team therapies. Acute hypoxic respiratory failure - extubated 11/11. Reintubated 11/16 for hypoxia on NRB. Resp cxs pending, on empiric unasyn  11/16 for possible aspiration. Resp cx with NRF, dcd unasyn  Afib/RVR - back in sinus, echo done HTN/freq PVCs - added Coreg  and losartan  11/18,  increased coreg  to 25BID and added norvasc  11/19, add scheduled hydralazine  11/20, cleviprex  off and moved to NP 11/20 HLD DM - SSI R hand swelling - no fx on xray FEN - reg diet; discontinue scheduled MiraLAX  and continue daily fiber VTE - SCD's, LMWH ID - none Foley- foley removed 11/20, spontaneous voids Dispo - 4NP,  medically stable for CIR I added vesibular eval due to history of vertigo. Added PRN meclizine . Resume melatonin for sleep. Previously taking xanax at bedtime as well which I will hold for now.    Jeanne Pringle, PA-C [facility] Please see Amion for pager number during day hours 7:00am-4:30pm   04/22/2024  *Care during the described time interval was provided by me. I have reviewed this patient's available data, including medical history, events of note, physical examination and test results as part of my evaluation.

## 2024-04-22 NOTE — Progress Notes (Signed)
 IP rehab admissions - A bed has become available today and will admit to CIR today.  [phone]

## 2024-04-23 DIAGNOSIS — S069XAD Unspecified intracranial injury with loss of consciousness status unknown, subsequent encounter: Secondary | ICD-10-CM

## 2024-04-23 DIAGNOSIS — E119 Type 2 diabetes mellitus without complications: Secondary | ICD-10-CM | POA: Diagnosis not present

## 2024-04-23 DIAGNOSIS — I10 Essential (primary) hypertension: Secondary | ICD-10-CM

## 2024-04-23 DIAGNOSIS — Z794 Long term (current) use of insulin: Secondary | ICD-10-CM

## 2024-04-23 DIAGNOSIS — D62 Acute posthemorrhagic anemia: Secondary | ICD-10-CM | POA: Diagnosis not present

## 2024-04-23 LAB — COMPREHENSIVE METABOLIC PANEL WITH GFR
ALT: 28 U/L (ref 0–44)
AST: 22 U/L (ref 15–41)
Albumin: 2.7 g/dL — ABNORMAL LOW (ref 3.5–5.0)
Alkaline Phosphatase: 53 U/L (ref 38–126)
Anion gap: 10 (ref 5–15)
BUN: 12 mg/dL (ref 8–23)
CO2: 25 mmol/L (ref 22–32)
Calcium: 8.7 mg/dL — ABNORMAL LOW (ref 8.9–10.3)
Chloride: 105 mmol/L (ref 98–111)
Creatinine, Ser: 0.4 mg/dL — ABNORMAL LOW (ref 0.44–1.00)
GFR, Estimated: >60 mL/min (ref >60)
Glucose, Bld: 133 mg/dL — ABNORMAL HIGH (ref 70–99)
Potassium: 3.8 mmol/L (ref 3.5–5.1)
Sodium: 140 mmol/L (ref 135–145)
Total Bilirubin: 0.4 mg/dL (ref 0.0–1.2)
Total Protein: 5.5 g/dL — ABNORMAL LOW (ref 6.5–8.1)

## 2024-04-23 LAB — CBC WITH DIFFERENTIAL/PLATELET
Abs Immature Granulocytes: 0.19 K/uL — ABNORMAL HIGH (ref 0.00–0.07)
Basophils Absolute: 0.1 K/uL (ref 0.0–0.1)
Basophils Relative: 0 %
Eosinophils Absolute: 0.4 K/uL (ref 0.0–0.5)
Eosinophils Relative: 2 %
HCT: 23.9 % — ABNORMAL LOW (ref 36.0–46.0)
Hemoglobin: 7.9 g/dL — ABNORMAL LOW (ref 12.0–15.0)
Immature Granulocytes: 1 %
Lymphocytes Relative: 12 %
Lymphs Abs: 2.2 K/uL (ref 0.7–4.0)
MCH: 28.9 pg (ref 26.0–34.0)
MCHC: 33.1 g/dL (ref 30.0–36.0)
MCV: 87.5 fL (ref 80.0–100.0)
Monocytes Absolute: 1.4 K/uL — ABNORMAL HIGH (ref 0.1–1.0)
Monocytes Relative: 8 %
Neutro Abs: 13.9 K/uL — ABNORMAL HIGH (ref 1.7–7.7)
Neutrophils Relative %: 77 %
Platelets: 441 K/uL — ABNORMAL HIGH (ref 150–400)
RBC: 2.73 MIL/uL — ABNORMAL LOW (ref 3.87–5.11)
RDW: 17.2 % — ABNORMAL HIGH (ref 11.5–15.5)
WBC: 18 K/uL — ABNORMAL HIGH (ref 4.0–10.5)
nRBC: 0 % (ref 0.0–0.2)

## 2024-04-23 LAB — GLUCOSE, CAPILLARY
Glucose-Capillary: 157 mg/dL — ABNORMAL HIGH (ref 70–99)
Glucose-Capillary: 166 mg/dL — ABNORMAL HIGH (ref 70–99)
Glucose-Capillary: 160 mg/dL — ABNORMAL HIGH (ref 70–99)
Glucose-Capillary: 113 mg/dL — ABNORMAL HIGH (ref 70–99)

## 2024-04-23 MED ADMIN — Dextromethorphan-Guaifenesin Syrup 10-100 MG/5ML: 10 mL | ORAL | NDC 00121127610

## 2024-04-23 MED ADMIN — Hydralazine HCl Tab 50 MG: 50 mg | ORAL | NDC 23155083401

## 2024-04-23 MED ADMIN — Nutritional Supplement Liquid: 237 mL | ORAL | NDC 7007464921

## 2024-04-23 MED ADMIN — Nutritional Supplement Liquid: 237 mL | ORAL | NDC 7007464928

## 2024-04-23 MED ADMIN — Rosuvastatin Calcium Tab 5 MG: 10 mg | ORAL | NDC 65862029390

## 2024-04-23 MED ADMIN — Psyllium Powder Packet 95%: 1 | ORAL | NDC 3700074087

## 2024-04-23 MED ADMIN — Cholecalciferol Tab 25 MCG (1000 Unit): 1000 [IU] | ORAL | NDC 2055503300

## 2024-04-23 MED ADMIN — Insulin Aspart Inj Soln 100 Unit/ML: 3 [IU] | SUBCUTANEOUS | NDC 73070010011

## 2024-04-23 MED ADMIN — Acetaminophen Tab 500 MG: 1000 mg | ORAL | NDC 50580045711

## 2024-04-23 MED ADMIN — THERA M PLUS PO TABS: 1 | ORAL | NDC 1650058694

## 2024-04-23 MED ADMIN — Thiamine Mononitrate Tab 100 MG: 100 mg | ORAL | NDC 54629005701

## 2024-04-23 MED ADMIN — Ascorbic Acid Tab 500 MG: 500 mg | ORAL | NDC 00904052361

## 2024-04-23 MED ADMIN — Carvedilol Tab 25 MG: 25 mg | ORAL | NDC 72888003701

## 2024-04-23 MED ADMIN — Losartan Potassium Tab 50 MG: 100 mg | ORAL | NDC 68180037709

## 2024-04-23 MED ADMIN — Enoxaparin Sodium Inj Soln Pref Syr 30 MG/0.3ML: 30 mg | SUBCUTANEOUS | NDC 71288043280

## 2024-04-23 MED ADMIN — Amlodipine Besylate Tab 10 MG (Base Equivalent): 10 mg | ORAL | NDC 68382012316

## 2024-04-23 MED ADMIN — Melatonin Tab 3 MG: 3 mg | ORAL | NDC 3160402741

## 2024-04-23 MED ADMIN — Citalopram Hydrobromide Tab 20 MG (Base Equiv): 20 mg | ORAL | NDC 00904608561

## 2024-04-23 NOTE — Evaluation (Signed)
 Occupational Therapy Assessment and Plan  Patient Details  Name: Jeanne Ellison MRN: [id number] Date of Birth: [birth year]  OT Diagnosis: muscle weakness (generalized) Rehab Potential: Rehab Potential (ACUTE ONLY): Good ELOS: 5-7 days   Today's Date: 04/23/2024 OT Individual Time: 9054-8954 OT Individual Time Calculation (min): 60 min     Pt seen for initial evaluation and ADL training with a focus on pt's endurance and balance. Pt is highly motivated and put in excellent effort.  She is only needing CGA and min A with socks.  Pt completed all self care tasks including a shower.  Coved staples with a shower cap.  Reviewed role of OT, discussed POC and pt's goals, and ELOS. Pt resting in bed With all needs met and ...alarm set.      Hospital Problem: Principal Problem:   TBI (traumatic brain injury) [facility])   Past Medical History:  Past Medical History:  Diagnosis Date   Diabetes mellitus without complication (HCC)    Type II   Hypertension    Past Surgical History:  Past Surgical History:  Procedure Laterality Date   ABDOMINAL HYSTERECTOMY     APPENDECTOMY     CRANIOTOMY Left 04/11/2024   Procedure: CRANIOTOMY HEMATOMA EVACUATION SUBDURAL;  Surgeon: Lanis Pupa, MD;  Location: [facility] OR;  Service: Neurosurgery;  Laterality: Left;    Assessment & Plan Clinical Impression: Jeanne CHRETIEN is a 83 y.o. female with a PMHx of vertigo, HTN, DM2, anxiety, HLD, arthritis, and GERD, who presented to [facility] on 04/09/24 after ground level found, found down with LKW around 8am. Was confused and agitated, intubated in the ED, and work up revealed L holohemispheric SDH with 3mm midline shift, no associated skull fx. CT C/A/P neg for acute injury, just showed atherosclerosis, colonic diverticulosis, hiatal hernia, and mild cardiomegaly. She was extubated 04/10/24, however on 04/11/24 she became more confused and repeat CTH showing increased L SDH with increased midline shift, therefore  she underwent L temporoparietal craniotomy by Dr. Lanis. Initially improving however on 04/15/24 she developed hypoxia and Afib with RVR, was reintubated, and PCCM consulted. Afib RVR treated with Amio but converted back to NSR post intubation. CXR with pulm edema, treated empirically for aspiration with Unasyn , which was d/c'd 11/19 after improvement/neg respiratory cultures. Also treated with intermittent Lasix , improved, extubated again on 04/16/24. EEGs negative for seizure. Completed 14d of Keppra  total, d/c'd on admission to CIR.  Hospital course also complicated by urinary retention, foley replaced 11/14-20, but has been voiding spontaneously since then. Cortrak d/c'd 11/21. BPs requiring IV Clevidipine  but off since 11/20, now managed well with PO meds. Patient did endorse having vertigo which was chronic. Patient requiring comprehensive rehabilitation program due to functional decline.   .  Patient transferred to CIR on 04/22/2024 .    Patient currently requires supervision to min A with basic self-care skills secondary to muscle weakness, decreased cardiorespiratoy endurance, and decreased standing balance and decreased balance strategies.  Prior to hospitalization, patient was fully independent with ADLs, cooking, housework, yardwork, driving.    Patient will benefit from skilled intervention to increase independence with basic self-care skills and increase level of independence with iADL prior to discharge home with care partner.  Anticipate patient will require intermittent supervision and follow up home health.  OT - End of Session Activity Tolerance: Tolerates < 10 min activity, no significant change in vital signs Endurance Deficit: Yes OT Assessment Rehab Potential (ACUTE ONLY): Good OT Barriers to Discharge: None OT Patient demonstrates impairments in the  following area(s): Balance;Endurance;Motor OT Basic ADL's Functional Problem(s): Bathing;Dressing;Toileting OT Advanced ADL's  Functional Problem(s): Simple Meal Preparation;Light Housekeeping OT Transfers Functional Problem(s): Toilet;Tub/Shower OT Additional Impairment(s): None OT Plan OT Intensity: Minimum of 1-2 x/day, 45 to 90 minutes OT Duration/Estimated Length of Stay: 5-7 days OT Treatment/Interventions: Balance/vestibular training;Discharge planning;DME/adaptive equipment instruction;Functional mobility training;Patient/family education;Neuromuscular re-education;Psychosocial support;Self Care/advanced ADL retraining;Therapeutic Activities;Therapeutic Exercise;UE/LE Strength taining/ROM OT Self Feeding Anticipated Outcome(s): independent OT Basic Self-Care Anticipated Outcome(s): mod ind OT Toileting Anticipated Outcome(s): mod ind OT Bathroom Transfers Anticipated Outcome(s): mod ind OT Recommendation Recommendations for Other Services: None Patient destination: Home Follow Up Recommendations: Home health OT Equipment Recommended: Tub/shower bench   OT Evaluation Precautions/Restrictions  Precautions Precautions: Fall Precaution/Restrictions Comments: SBP <150 Restrictions Weight Bearing Restrictions Per Provider Order: No   Pain Pain Assessment Pain Scale: 0-10 Pain Score: 0-No pain Home Living/Prior Functioning Home Living Family/patient expects to be discharged to:: Private residence Living Arrangements: Spouse/significant other Available Help at Discharge: Family, Available 24 hours/day Type of Home: House Home Access: Stairs to enter [employer] of Steps: 5 Entrance Stairs-Rails: Right, Left, Can reach both Home Layout: One level Bathroom Shower/Tub: Engineer, Manufacturing Systems: Handicapped height Bathroom Accessibility: Yes  Lives With: Spouse Prior Function Level of Independence: Independent with basic ADLs, Independent with homemaking with ambulation, Independent with gait, Independent with transfers, Requires assistive device for independence  Able to Take  Stairs?: Yes Driving: Yes Vocation: Retired Administrator, Sports Baseline Vision/History: 1 Wears glasses Ability to See in Adequate Light: 0 Adequate Patient Visual Report: Blurring of vision (pt feels her distance vision is slightly blurry but able to read clock on the wall and see signs) Vision Assessment?: Yes Eye Alignment: Within Functional Limits Ocular Range of Motion: Within Functional Limits Tracking/Visual Pursuits: Able to track stimulus in all quads without difficulty Visual Fields: No apparent deficits Perception  Perception: Within Functional Limits Praxis Praxis: WFL Cognition Cognition Overall Cognitive Status: Within Functional Limits for tasks assessed Safety/Judgment: Appears intact Brief Interview for Mental Status (BIMS) Repetition of Three Words (First Attempt): 3 Temporal Orientation: Year: Correct Temporal Orientation: Month: Accurate within 5 days Temporal Orientation: Day: Correct Recall: Sock: Yes, after cueing (something to wear) Recall: Blue: Yes, no cue required Recall: Bed: Yes, no cue required BIMS Summary Score: 14 Sensation Sensation Light Touch: Appears Intact Hot/Cold: Appears Intact Proprioception: Appears Intact Coordination Gross Motor Movements are Fluid and Coordinated: Yes Fine Motor Movements are Fluid and Coordinated: Yes Motor  Motor Motor - Skilled Clinical Observations: general deconditioning  Trunk/Postural Assessment  Postural Control Postural Control: Within Functional Limits  Balance Dynamic Sitting Balance Dynamic Sitting - Level of Assistance: 5: Stand by assistance Static Standing Balance Static Standing - Level of Assistance: 5: Stand by assistance Dynamic Standing Balance Dynamic Standing - Level of Assistance: 4: Min assist Extremity/Trunk Assessment RUE Assessment RUE Assessment: Within Functional Limits LUE Assessment LUE Assessment: Within Functional Limits  Care Tool Care Tool Self Care Eating   Eating  Assist Level: Supervision/Verbal cueing    Oral Care    Oral Care Assist Level: Supervision/Verbal cueing    Bathing   Body parts bathed by patient: Right arm;Left arm;Chest;Abdomen;Front perineal area;Buttocks;Face;Left upper leg;Right upper leg Body parts bathed by helper: Right lower leg;Left lower leg   Assist Level: Minimal Assistance - Patient > 75%    Upper Body Dressing(including orthotics)   What is the patient wearing?: Button up shirt   Assist Level: Set up assist    Lower Body Dressing (excluding footwear)  What is the patient wearing?: Underwear/pull up;Pants Assist for lower body dressing: Supervision/Verbal cueing    Putting on/Taking off footwear   What is the patient wearing?: Non-skid slipper socks Assist for footwear: Minimal Assistance - Patient > 75%       Care Tool Toileting Toileting activity   Assist for toileting: Supervision/Verbal cueing     Care Tool Bed Mobility Roll left and right activity   Roll left and right assist level: Supervision/Verbal cueing    Sit to lying activity   Sit to lying assist level: Supervision/Verbal cueing    Lying to sitting on side of bed activity   Lying to sitting on side of bed assist level: the ability to move from lying on the back to sitting on the side of the bed with no back support.: Supervision/Verbal cueing     Care Tool Transfers Sit to stand transfer   Sit to stand assist level: Supervision/Verbal cueing    Chair/bed transfer   Chair/bed transfer assist level: Contact Guard/Touching assist     Toilet transfer   Assist Level: Contact Guard/Touching assist     Care Tool Cognition  Expression of Ideas and Wants Expression of Ideas and Wants: 4. Without difficulty (complex and basic) - expresses complex messages without difficulty and with speech that is clear and easy to understand  Understanding Verbal and Non-Verbal Content Understanding Verbal and Non-Verbal Content: 4. Understands (complex and  basic) - clear comprehension without cues or repetitions   Memory/Recall Ability Memory/Recall Ability : Current season;That he or she is in a hospital/hospital unit   Refer to Care Plan for Long Term Goals  SHORT TERM GOAL WEEK 1 OT Short Term Goal 1 (Week 1): STGs = LTGs  Recommendations for other services: None    Skilled Therapeutic Intervention ADL ADL Eating: Independent Grooming: Independent Upper Body Bathing: Supervision/safety Where Assessed-Upper Body Bathing: Shower Lower Body Bathing: Minimal assistance Where Assessed-Lower Body Bathing: Shower Upper Body Dressing: Supervision/safety Where Assessed-Upper Body Dressing: Edge of bed Lower Body Dressing: Minimal assistance Where Assessed-Lower Body Dressing: Edge of bed Toileting: Supervision/safety Where Assessed-Toileting: Teacher, Adult Education: Furniture Conservator/restorer Method: Proofreader: Acupuncturist: Administrator, Arts Method: Designer, Industrial/product: Environmental Education Officer Sit to Stand: Supervision/Verbal cueing Stand to Sit: Supervision/Verbal cueing   Discharge Criteria: Patient will be discharged from OT if patient refuses treatment 3 consecutive times without medical reason, if treatment goals not met, if there is a change in medical status, if patient makes no progress towards goals or if patient is discharged from hospital.  The above assessment, treatment plan, treatment alternatives and goals were discussed and mutually agreed upon: by patient  Demone Lyles 04/23/2024, 12:38 PM

## 2024-04-23 NOTE — Progress Notes (Signed)
 PMR Admission Coordinator Pre-Admission Assessment   Patient: Jeanne Ellison is an 83 y.o., female MRN: [id number] DOB: Sep 28, 1940 Height: 5' 5.5 (166.4 cm) Weight: 65.4 kg   Insurance Information HMO:     PPO: yes     PCP:      IPA:      80/20:      OTHER:  PRIMARY: [employer]      Policy#: [id number]      Subscriber: patient CM Name: Marval      Phone#:       Fax#: [phone] Pre-Cert#: [id number] approved by Marval from 04/21/24 to 04/27/24     Employer: Not working Benefits:  Phone #: [phone]     Name:   Eustacio Date: 06/01/2023 - 05/30/2024 Deductible: no deductible ($0) OOP Max: $3,400 ($25.20 met) CIR: $325/day co-pay for days 1-6, $0/day days 7-90 SNF:  $0/day co-pay for days 1-20, $214/day co-pay for days 21-100; limited to 100 days/benefit period Outpatient: $15/visit co-pay; limited by medical necessity Home Health:  100% coverage DME: 75% coverage; 25% co-insurance Providers: in-network   SECONDARY:       Policy#:      Phone#:    Artist:       Phone#:    The Data Processing Manager" for patients in Inpatient Rehabilitation Facilities with attached "Privacy Act Statement-Health Care Records" was provided and verbally reviewed with: Family   Emergency Contact Information Contact Information       Name Relation Home Work Mobile    Mangan,Roy Spouse [phone]   [phone]         Other Contacts   None on File        Current Medical History  Patient Admitting Diagnosis: SDH   History of Present Illness: An 83 year old female with medical hx significant for: diabetes presented to [facility] on 04/09/24 d/t a fall with AMS. Pt intubated on 11/10. Noted right posterior scalp hematoma and abrasions on left shin. CT head revealed 6 mm left subdural hematoma with 3 mm midline shift. CT chest, abdomen and pelvis negative for acute injury. Neurosurgery consulted. Surgical intervention not recommended. Pt extubated  on 11/11. Repeat CT stable. Neuro status changes noted on 11/12. STAT CT head showed increased left SDH to 11 mm with 8 mm increased midline shift and early uncal herniation. Cortrak placed on 11/12. Pt underwent left temporo-parietal craniotomy for evacuation of SDH by Dr. Lanis on 04/11/24. EEG on 11/14 negative for seizures. Pt re-intubated on 11/16 d/t worsening hypoxia on NRB. Pt developed A-fib with RVR and transient hypotension. OG tube placed. EEG on 11/16 and 11/17 were negative for seizures. Pt extubated on 11/17. Therapy evaluations complete and CIR recommended d/t pt's deficits in functional mobility and aphasia.   Complete NIHSS TOTAL: 2   Patient's medical record from [facility] has been reviewed by the rehabilitation admission coordinator and physician.   Past Medical History      Past Medical History:  Diagnosis Date   Diabetes mellitus without complication (HCC)      Type II   Hypertension            Has the patient had major surgery during 100 days prior to admission? Yes   Family History   family history is not on file.   Current Medications  Current Medications    Current Facility-Administered Medications:    acetaminophen  (TYLENOL ) tablet 1,000 mg, 1,000 mg, Oral, Q6H, Fobbs, Rodericks T, RPH, 1,000 mg at 04/22/24  0444   amLODipine  (NORVASC ) tablet 10 mg, 10 mg, Oral, Daily, Lovick, Dreama SAILOR, MD, 10 mg at 04/22/24 9077   carvedilol  (COREG ) tablet 25 mg, 25 mg, Oral, BID WC, Lovick, Dreama SAILOR, MD, 25 mg at 04/22/24 9077   citalopram  (CELEXA ) tablet 20 mg, 20 mg, Oral, Daily, Fobbs, Rodericks T, RPH, 20 mg at 04/22/24 9077   clevidipine  (CLEVIPREX ) infusion 0.5 mg/mL, 0-21 mg/hr, Intravenous, Continuous, Claudene Toribio BROCKS, MD, Stopped at 04/19/24 1034   docusate (COLACE) 50 MG/5ML liquid 100 mg, 100 mg, Oral, BID, Fobbs, Rodericks T, RPH, 100 mg at 04/20/24 2056   enoxaparin  (LOVENOX ) injection 30 mg, 30 mg, Subcutaneous, Q12H, Sebastian Moles, MD, 30 mg  at 04/22/24 0048   feeding supplement (GLUCERNA SHAKE) (GLUCERNA SHAKE) liquid 237 mL, 237 mL, Oral, TID BM, Lovick, Dreama SAILOR, MD, 237 mL at 04/21/24 2149   hydrALAZINE  (APRESOLINE ) tablet 50 mg, 50 mg, Oral, Q8H, Ann Fine, MD, 50 mg at 04/22/24 0447   insulin  aspart (novoLOG ) injection 0-15 Units, 0-15 Units, Subcutaneous, TID AC & HS, Simaan, Elizabeth S, PA-C, 5 Units at 04/22/24 9076   labetalol  (NORMODYNE ) injection 20 mg, 20 mg, Intravenous, Q2H PRN, Paliwal, Aditya, MD, 20 mg at 04/20/24 0850   levETIRAcetam  (KEPPRA ) tablet 500 mg, 500 mg, Oral, BID, Harold Scholz, MD, 500 mg at 04/22/24 9077   lip balm (CARMEX) ointment, , Topical, PRN, Sebastian Moles, MD, 1 Application at 04/17/24 1805   losartan  (COZAAR ) tablet 100 mg, 100 mg, Oral, Daily, Sebastian Moles, MD, 100 mg at 04/22/24 9077   meclizine  (ANTIVERT ) tablet 12.5 mg, 12.5 mg, Oral, TID PRN, Simaan, Elizabeth S, PA-C   melatonin tablet 3 mg, 3 mg, Oral, QHS, Simaan, Elizabeth S, PA-C   multivitamin with minerals tablet 1 tablet, 1 tablet, Oral, Daily, Fobbs, Rodericks T, RPH, 1 tablet at 04/22/24 9077   ondansetron  (ZOFRAN -ODT) disintegrating tablet 4 mg, 4 mg, Oral, Q6H PRN, 4 mg at 04/22/24 9077 **OR** ondansetron  (ZOFRAN ) injection 4 mg, 4 mg, Intravenous, Q6H PRN, Millen, Jessica B, RPH, 4 mg at 04/20/24 0907   Oral care mouth rinse, 15 mL, Mouth Rinse, PRN, Harold Scholz, MD   polyethylene glycol (MIRALAX  / GLYCOLAX ) packet 17 g, 17 g, Oral, Daily PRN, Simaan, Elizabeth S, PA-C   psyllium (HYDROCIL/METAMUCIL) 1 packet, 1 packet, Oral, BID, Simaan, Elizabeth S, PA-C, 1 packet at 04/22/24 9077   rosuvastatin  (CRESTOR ) tablet 10 mg, 10 mg, Oral, QHS, Fobbs, Rodericks T, RPH, 10 mg at 04/21/24 2148   sodium chloride  flush (NS) 0.9 % injection 10-40 mL, 10-40 mL, Intracatheter, Q12H, Sebastian Moles, MD, 10 mL at 04/22/24 9070   sodium chloride  flush (NS) 0.9 % injection 10-40 mL, 10-40 mL, Intracatheter, PRN, Sebastian Moles,  MD   [COMPLETED] thiamine  (VITAMIN B1) tablet 500 mg, 500 mg, Per Tube, Q8H, 500 mg at 04/14/24 0227 **FOLLOWED BY** thiamine  (VITAMIN B1) tablet 100 mg, 100 mg, Oral, Daily, Fobbs, Rodericks T, RPH, 100 mg at 04/22/24 9077     Patients Current Diet:  Diet Order                  Diet regular Room service appropriate? Yes with Assist; Fluid consistency: Thin  Diet effective now                         Precautions / Restrictions Precautions Precautions: Fall Precaution/Restrictions Comments: SBP <150 Restrictions Weight Bearing Restrictions Per Provider Order: No    Has the patient had  2 or more falls or a fall with injury in the past year? Yes   Prior Activity Level Community (5-7x/wk): gets out of house daily   Prior Functional Level Self Care: Did the patient need help bathing, dressing, using the toilet or eating? Independent   Indoor Mobility: Did the patient need assistance with walking from room to room (with or without device)? Independent   Stairs: Did the patient need assistance with internal or external stairs (with or without device)? Independent   Functional Cognition: Did the patient need help planning regular tasks such as shopping or remembering to take medications? Independent   Patient Information Are you of Hispanic, Latino/a,or Spanish origin?: X. Patient unable to respond, A. No, not of Hispanic, Latino/a, or Spanish origin What is your race?: X. Patient unable to respond, A. White Do you need or want an interpreter to communicate with a doctor or health care staff?: 9. Unable to respond Patient information obtained via proxy : son answered questions   Patient's Response To:  Health Literacy and Transportation Is the patient able to respond to health literacy and transportation needs?: No Health Literacy - How often do you need to have someone help you when you read instructions, pamphlets, or other written material from your doctor or pharmacy?:  Patient unable to respond Health Literacy and Transportation obtained via proxy: son answered questions (pt has not missed any appointments d/t transportation issues)   Home Assistive Devices / Equipment Home Equipment: Agricultural Consultant (2 wheels), [employer] - single point   Prior Device Use: Indicate devices/aids used by the patient prior to current illness, exacerbation or injury? cane   Current Functional Level Cognition   Arousal/Alertness: Awake/alert Overall Cognitive Status: Within Functional Limits for tasks assessed Orientation Level: Oriented X4 Attention: Selective, Alternating Focused Attention: Appears intact Selective Attention: Appears intact Alternating Attention: Appears intact Memory: Appears intact Awareness: Appears intact Problem Solving: Impaired Problem Solving Impairment: Verbal basic, Functional basic Executive Function: Reasoning Reasoning: Impaired Reasoning Impairment: Verbal basic, Functional basic    Extremity Assessment (includes Sensation/Coordination)   Upper Extremity Assessment: Defer to OT evaluation RUE Deficits / Details: overall WFL, globally 4/5. coordination WFL for grooming adn toileting LUE Deficits / Details: overall WFL, globally 4/5. coordination WFL for grooming adn toileting  Lower Extremity Assessment: Defer to PT evaluation     ADLs   Overall ADL's : Needs assistance/impaired Eating/Feeding: Moderate assistance Grooming: Contact guard assist, Standing, Wash/dry hands, Wash/dry face Upper Body Bathing: Maximal assistance Lower Body Bathing: Maximal assistance, +2 for physical assistance, +2 for safety/equipment Upper Body Dressing : Moderate assistance Lower Body Dressing: Maximal assistance, +2 for physical assistance, +2 for safety/equipment Toilet Transfer: Minimal assistance, Ambulation, Rolling walker (2 wheels), Regular Toilet Toilet Transfer Details (indicate cue type and reason): pivotal stepping only, pt complained of  dizziness on standing Toileting- Clothing Manipulation and Hygiene: Contact guard assist, Sit to/from stand Toileting - Clothing Manipulation Details (indicate cue type and reason): continent bladder at the toilet Functional mobility during ADLs: Minimal assistance, Rolling walker (2 wheels) General ADL Comments: limited by balance, cognition and activity tolerance     Mobility   Overal bed mobility: Needs Assistance Bed Mobility: Sit to Supine, Supine to Sit Rolling: Min assist Supine to sit: Min assist Sit to supine: Min assist General bed mobility comments: assist for trunk and LE management, pt able to boost self up in bed with HOB flat.     Transfers   Overall transfer level: Needs assistance Equipment used: Rolling  walker (2 wheels) Transfers: Sit to/from Stand Sit to Stand: Min assist Bed to/from chair/wheelchair/BSC transfer type:: Step pivot Step pivot transfers: Mod assist, +2 physical assistance, +2 safety/equipment General transfer comment: slow to rise, light boost up assist     Ambulation / Gait / Stairs / Wheelchair Mobility   Ambulation/Gait Ambulation/Gait assistance: Editor, Commissioning (Feet): 125 Feet Assistive device: Rolling walker (2 wheels) Gait Pattern/deviations: Step-through pattern, Decreased stride length, Trunk flexed General Gait Details: assist to steady, cues for proximity to RW and upright posture Gait velocity: decr     Posture / Balance Dynamic Sitting Balance Sitting balance - Comments: CGA Balance Overall balance assessment: Needs assistance Sitting-balance support: No upper extremity supported, Feet supported Sitting balance-Leahy Scale: Fair Sitting balance - Comments: CGA Standing balance support: Single extremity supported, During functional activity Standing balance-Leahy Scale: Fair Standing balance comment: statically at the sink/toilet     Special considerations/life events  Continuous Drip IV  None, Oxygen None, Skin  Surgical Incision: head; Abrasion: arm, leg/bilateral; Ecchymosis: leg/bilateral; Erythema/Redness: arm, leg/bilateral, Bowel and Bladder incontinence, Urethral Catheter, and Diabetic management Novolog  0-15 units every 4 hours    Previous Home Environment (from acute therapy documentation) Living Arrangements: Spouse/significant other  Lives With: Spouse Available Help at Discharge: Family, Available 24 hours/day Type of Home: House Home Layout: One level Home Access: Stairs to enter Entrance Stairs-Rails: Left, Right Entrance Stairs-Number of Steps: 3 Bathroom Shower/Tub: Engineer, Manufacturing Systems: Standard Bathroom Accessibility: Yes How Accessible: Accessible via walker Home Care Services: No   Discharge Living Setting Plans for Discharge Living Setting: Patient's home Type of Home at Discharge: House Discharge Home Layout: One level Discharge Home Access: Stairs to enter Entrance Stairs-Rails: Right, Left Entrance Stairs-Number of Steps: 3 Discharge Bathroom Shower/Tub: Tub/shower unit Discharge Bathroom Toilet: Standard Discharge Bathroom Accessibility: Yes How Accessible: Accessible via walker Does the patient have any problems obtaining your medications?: No   Social/Family/Support Systems Anticipated Caregiver: Gaither Altes (husband) and family Anticipated Caregiver's Contact Information: Gaither: [phone], Christopher (son): [phone] Caregiver Availability: 24/7 Discharge Plan Discussed with Primary Caregiver: Yes Is Caregiver In Agreement with Plan?: Yes Does Caregiver/Family have Issues with Lodging/Transportation while Pt is in Rehab?: No   Goals Patient/Family Goal for Rehab: PT/OT/SLP supervision to mod I goals Expected length of stay: 10-12 days Pt/Family Agrees to Admission and willing to participate: Yes Program Orientation Provided & Reviewed with Pt/Caregiver Including Roles  & Responsibilities: Yes   Decrease burden of Care through IP rehab  admission: NA   Possible need for SNF placement upon discharge: Not anticipated   Patient Condition: I have reviewed medical records from [facility], spoken with CM, and patient and family member. I met with patient at the bedside and discussed via phone for inpatient rehabilitation assessment.  Patient will benefit from ongoing PT, OT, and SLP, can actively participate in 3 hours of therapy a day 5 days of the week, and can make measurable gains during the admission.  Patient will also benefit from the coordinated team approach during an Inpatient Acute Rehabilitation admission.  The patient will receive intensive therapy as well as Rehabilitation physician, nursing, social worker, and care management interventions.  Due to bladder management, bowel management, safety, skin/wound care, disease management, medication administration, pain management, and patient education the patient requires 24 hour a day rehabilitation nursing.  The patient is currently min to mod assist with mobility and basic ADLs.  Discharge setting and therapy post discharge at home with home health  is anticipated.  Patient has agreed to participate in the Acute Inpatient Rehabilitation Program and will admit today.   Preadmission Screen Completed By:  Tinnie SHAUNNA Yvone Delayne, 04/22/2024 12:12 PM and updated by Lugene Ropes, RN on 04/22/24 at 12:10 pm. ______________________________________________________________________   Discussed status with Dr. Carilyn on 04/22/24 at 1200 and received approval for admission today.   Admission Coordinator:  Tinnie SHAUNNA Yvone Delayne, CCC-SLP, time 1210/Date 04/22/24    Assessment/Plan: Diagnosis: Fall with left SDH Does the need for close, 24 hr/day Medical supervision in concert with the patient's rehab needs make it unreasonable for this patient to be served in a less intensive setting? Yes Co-Morbidities requiring supervision/potential complications: s/p left craniotomy for cerebral  edema, Afib RVR Due to bladder management, bowel management, safety, skin/wound care, disease management, medication administration, pain management, and patient education, does the patient require 24 hr/day rehab nursing? Yes Does the patient require coordinated care of a physician, rehab nurse, PT, OT, and SLP to address physical and functional deficits in the context of the above medical diagnosis(es)? Yes Addressing deficits in the following areas: balance, endurance, locomotion, strength, transferring, bowel/bladder control, bathing, dressing, toileting, and cognition Can the patient actively participate in an intensive therapy program of at least 3 hrs of therapy 5 days a week? Yes The potential for patient to make measurable gains while on inpatient rehab is good Anticipated functional outcomes upon discharge from inpatient rehab: modified independent and supervision PT, modified independent and supervision OT, modified independent and supervision SLP Estimated rehab length of stay to reach the above functional goals is: 10-12d Anticipated discharge destination: Home 10. Overall Rehab/Functional Prognosis: good     MD Signature: Prentice CHARLENA Carilyn M.D. [facility] Fellow Am Acad of Phys Med and Rehab Diplomate Am Board of Electrodiagnostic Med Fellow Am Board of Interventional Pain            Revision History  Date/Time User Provider Type Action  04/22/2024  1:07 PM Kirsteins, Prentice BRAVO, MD Physician Sign  04/22/2024 12:43 PM Ropes Lovett HERO, RN Rehab Admission Coordinator Share  04/22/2024 12:12 PM Ropes Lovett HERO, RN Rehab Admission Coordinator Share  04/22/2024 12:07 PM Ropes Lovett HERO, RN Rehab Admission Coordinator Share  04/22/2024 11:59 AM Ropes Lovett HERO, RN Rehab Admission Coordinator Share  04/16/2024  2:41 PM Yvone Delayne Tinnie SHAUNNA, CCC-SLP Rehab Admission Coordinator Share  04/16/2024  2:40 PM Yvone Delayne Tinnie SHAUNNA, CCC-SLP Rehab Admission  Coordinator

## 2024-04-23 NOTE — Progress Notes (Addendum)
 PROGRESS NOTE   Subjective/Complaints: Had a good night. Occasional headache. Couldn't remember for sure if she had BM yesterday but occasionally can get constipated at home, has IBS.   ROS: Patient denies fever, rash, sore throat, blurred vision, dizziness, nausea, vomiting, diarrhea, cough, shortness of breath or chest pain, joint or back/neck pain,  or mood change.   Objective:   No results found. Recent Labs    04/23/24 0432  WBC 18.0*  HGB 7.9*  HCT 23.9*  PLT 441*   Recent Labs    04/22/24 0539 04/23/24 0432  NA 140 140  K 3.8 3.8  CL 108 105  CO2 24 25  GLUCOSE 129* 133*  BUN 9 12  CREATININE 0.46 0.40*  CALCIUM  8.8* 8.7*    Intake/Output Summary (Last 24 hours) at 04/23/2024 0858 Last data filed at 04/23/2024 0510 Gross per 24 hour  Intake 400 ml  Output --  Net 400 ml        Physical Exam: Vital Signs Blood pressure (!) 138/55, pulse 81, temperature 99.2 F (37.3 C), temperature source Oral, resp. rate 18, height 5' 2 (1.575 m), weight 65.1 kg, SpO2 95%.   General: Alert and oriented x 3, No apparent distress HEENT: Head is normocephalic, PERRLA, EOMI, sclera anicteric, oral mucosa pink and moist, dentition intact, ext ear canals clear,  Neck: Supple without JVD or lymphadenopathy Heart: Reg rate and rhythm. No murmurs rubs or gallops Chest: CTA bilaterally without wheezes, rales, or rhonchi; no distress Abdomen: Soft, non-tender, non-distended, bowel sounds positive. Extremities: No clubbing, cyanosis, or edema. Pulses are 2+ Psych: Pt's affect is appropriate. Pt is cooperative Skin: Clean and intact without signs of breakdown, incision CDI with staples along left scalp.  Neuro:  Alert and oriented x 3. Normal insight and awareness. Intact Memory for month/year/day of week, person, place, and reason. Normal language and speech. Cranial nerve exam unremarkable. MMT: BUE 4- to 4/5 prox to  distal. BLE 3+/4- to 4/5 prox to distal. Sensory exam normal for light touch and pain in all 4 limbs. No limb ataxia or cerebellar signs. No abnormal tone appreciated.    Musculoskeletal: Full ROM, No pain with AROM or PROM in the neck, trunk, or extremities. Posture appropriate    Assessment/Plan: 1. Functional deficits which require 3+ hours per day of interdisciplinary therapy in a comprehensive inpatient rehab setting. Physiatrist is providing close team supervision and 24 hour management of active medical problems listed below. Physiatrist and rehab team continue to assess barriers to discharge/monitor patient progress toward functional and medical goals  Care Tool:  Bathing              Bathing assist       Upper Body Dressing/Undressing Upper body dressing        Upper body assist      Lower Body Dressing/Undressing Lower body dressing            Lower body assist       Toileting Toileting    Toileting assist       Transfers Chair/bed transfer  Transfers assist           Locomotion Ambulation   Ambulation  assist              Walk 10 feet activity   Assist           Walk 50 feet activity   Assist           Walk 150 feet activity   Assist           Walk 10 feet on uneven surface  activity   Assist           Wheelchair     Assist               Wheelchair 50 feet with 2 turns activity    Assist            Wheelchair 150 feet activity     Assist          Blood pressure (!) 138/55, pulse 81, temperature 99.2 F (37.3 C), temperature source Oral, resp. rate 18, height 5' 2 (1.575 m), weight 65.1 kg, SpO2 95%.  Medical Problem List and Plan: 1. Functional deficits secondary to ground level fall with L SDH s/p L frontoparietal craniotomy 04/11/24 by Dr. Lanis:             -patient may  shower with shower cap             -ELOS/Goals: 10-12d sup/mod I===may be shorter than  that             --Patient is beginning CIR therapies today including PT, OT, and SLP    2.  Antithrombotics: -DVT/anticoagulation:  Mechanical: Sequential compression devices, below knee Bilateral lower extremities             -Also Lovenox  30mg  BID             -antiplatelet therapy: none   3. Pain Management: tylenol  1000mg  q6h scheduled. Tramadol  and robaxin  PRN. On chart review, pt may have previously been on Celebrex 200mg  BID-- hold for now.    4. Mood/Behavior/Sleep/hx of anxiety:             -antipsychotic agents: none -Melatonin 3mg  nightly for sleep, previously used Xanax 0.5mg  PRN   -slept well last night -Continue Celexa  20mg  daily   5. Neuropsych/cognition: This patient is capable of making decisions on her own behalf.   6. Skin/Wound Care: wound care and routine skin care; staples out 11/26 or after   7. Fluids/Electrolytes/Nutrition: monitor I&O and routine labs, continue vitamins/supplements. Cortrak d/c'd 11/20:             -Diet changed to carb mod -Currently has glucerna shakes TID-- per notes, doesn't do well with Ensure  -eating well   8. HTN/Afib with RVR: previously on Bisoprolol -hydrochlorothiazide  2.5-6.25mg  ?0.5tab daily-- not on formulary and has now been switched. Off Cleviprex  since 11/20. Off Amio, back in NSR -Continue Amlodipine  10mg  daily, Carvedilol  25mg  BID, Hydralazine  50mg  TID, Losartan  100mg  daily -11/24 reasonable control.    9. HLD: continue Rosuvastatin  10mg  nightly   10. DM2: previously on Metformin XR 750mg  daily, currently held, resume as appropriate. On SSI Moderate scale. Monitor CBGs.    CBG (last 3)  Recent Labs    04/22/24 1705 04/22/24 2137 04/23/24 0515  GLUCAP 137* 168* 157*    11/24 controlled 11. Constipation: continue Colace 100mg  BID and psyllium packets BID, Miralax  PRN, also has Sorbitol /Fleet's PRN.  -LBM 11/23 type 3 so monitor closely.   -stay with same meds for now 12. Vertigo: apparently has hx of this, may have  caused the fall             -PT vestibular eval             -Meclizine  12.5mg  TID PRN   13. AHRF/VDRF/pulm edema: intubated 11/10-11 then 11/16-17, treated with Unasyn  but d/c'd 11/19 after resp cx neg and hypoxia improved.             -Incentive Spiro   14. Urinary retention: foley 11/14-20, but voiding spontaneously since; chart review shows ?Flomax previously--  -voiding continently at present   15. ABLA Hgb 7-8, monitor closely, transfuse <7   11/24- 7.9, continue to follow, no gross blood loss  LOS: 1 days A FACE TO FACE EVALUATION WAS PERFORMED  Arthea ONEIDA Gunther 04/23/2024, 8:58 AM

## 2024-04-23 NOTE — Progress Notes (Signed)
 Inpatient Rehabilitation  Patient information reviewed and entered into eRehab system by [facility]. Karen Kays., CCC/SLP, PPS Coordinator.  Information including medical coding, functional ability and quality indicators will be reviewed and updated through discharge.

## 2024-04-23 NOTE — Progress Notes (Signed)
 Inpatient Rehabilitation Admission Medication Review by a Pharmacist  A complete drug regimen review was completed for this patient to identify any potential clinically significant medication issues.  High Risk Drug Classes Is patient taking? Indication by Medication  Antipsychotic No   Anticoagulant Yes Lovenox  - VTE prophylaxis  Antibiotic No   Opioid Yes Tramadol  - pain   Antiplatelet No   Hypoglycemics/insulin  Yes Insulin  aspart - DM2  Vasoactive Medication Yes Amlodipine , coreg , losartan , hydralazine  - HTN  Chemotherapy No   Other Yes Acetaminophen  - pain Citalopram  - mood, anxiety hx Crestor  - HLD Melatonin -sleep Meclizine  - dizziness Methocarbamol - muscle spasms Ondansetron  - nausea  Vitamin D , Vit C, MVI, thiamine  - supplements PEG, docusate, metamucil,  sorbitol , fleet enema  - bowel regimen     Type of Medication Issue Identified Description of Issue Recommendation(s)  Drug Interaction(s) (clinically significant)     Duplicate Therapy     Allergy     No Medication Administration End Date     Incorrect Dose     Additional Drug Therapy Needed     Significant med changes from prior encounter (inform family/care partners about these prior to discharge). Metformin XR  currently held.  Celebrex 200mg  BID-- hold for now. Bisoprolol -hydrochlorothiazide  discontinued, switched to alternate meds Alprazolam not resumed. Restart PTA meds when and if necessary during CIR admission or at time of discharge, if warranted   Other       Clinically significant medication issues were identified that warrant physician communication and completion of prescribed/recommended actions by midnight of the next day:  No  Name of provider notified for urgent issues identified:   Provider Method of Notification:     Pharmacist comments:   Time spent performing this drug regimen review (minutes):  15   Levorn Gaskins, [place] Clinical Pharmacist  04/23/2024 1:47 PM

## 2024-04-23 NOTE — Plan of Care (Signed)
 Problem: RH Balance Goal: LTG: Patient will maintain dynamic sitting balance (OT) Description: LTG:  Patient will maintain dynamic sitting balance with assistance during activities of daily living (OT) Flowsheets (Taken 04/23/2024 1244) LTG: Pt will maintain dynamic sitting balance during ADLs with: Independent Goal: LTG Patient will maintain dynamic standing with ADLs (OT) Description: LTG:  Patient will maintain dynamic standing balance with assist during activities of daily living (OT)  Flowsheets (Taken 04/23/2024 1244) LTG: Pt will maintain dynamic standing balance during ADLs with: Independent with assistive device   Problem: Sit to Stand Goal: LTG:  Patient will perform sit to stand in prep for activites of daily living with assistance level (OT) Description: LTG:  Patient will perform sit to stand in prep for activites of daily living with assistance level (OT) Flowsheets (Taken 04/23/2024 1244) LTG: PT will perform sit to stand in prep for activites of daily living with assistance level: Independent   Problem: RH Grooming Goal: LTG Patient will perform grooming w/assist,cues/equip (OT) Description: LTG: Patient will perform grooming with assist, with/without cues using equipment (OT) Flowsheets (Taken 04/23/2024 1244) LTG: Pt will perform grooming with assistance level of: Independent   Problem: RH Bathing Goal: LTG Patient will bathe all body parts with assist levels (OT) Description: LTG: Patient will bathe all body parts with assist levels (OT) Flowsheets (Taken 04/23/2024 1244) LTG: Pt will perform bathing with assistance level/cueing: Independent with assistive device    Problem: RH Dressing Goal: LTG Patient will perform upper body dressing (OT) Description: LTG Patient will perform upper body dressing with assist, with/without cues (OT). Flowsheets (Taken 04/23/2024 1244) LTG: Pt will perform upper body dressing with assistance level of: Independent Goal: LTG  Patient will perform lower body dressing w/assist (OT) Description: LTG: Patient will perform lower body dressing with assist, with/without cues in positioning using equipment (OT) Flowsheets (Taken 04/23/2024 1244) LTG: Pt will perform lower body dressing with assistance level of: Independent with assistive device   Problem: RH Toileting Goal: LTG Patient will perform toileting task (3/3 steps) with assistance level (OT) Description: LTG: Patient will perform toileting task (3/3 steps) with assistance level (OT)  Flowsheets (Taken 04/23/2024 1244) LTG: Pt will perform toileting task (3/3 steps) with assistance level: Independent with assistive device   Problem: RH Simple Meal Prep Goal: LTG Patient will perform simple meal prep w/assist (OT) Description: LTG: Patient will perform simple meal prep with assistance, with/without cues (OT). Flowsheets (Taken 04/23/2024 1244) LTG: Pt will perform simple meal prep with assistance level of: Supervision/Verbal cueing   Problem: RH Laundry Goal: LTG Patient will perform laundry w/assist, cues (OT) Description: LTG: Patient will perform laundry with assistance, with/without cues (OT). Flowsheets (Taken 04/23/2024 1244) LTG: Pt will perform laundry with assistance level of: Supervision/Verbal cueing   Problem: RH Light Housekeeping Goal: LTG Patient will perform light housekeeping w/assist (OT) Description: LTG: Patient will perform light housekeeping with assistance, with/without cues (OT). Flowsheets (Taken 04/23/2024 1244) LTG: Pt will perform light housekeeping with assistance level of: Supervision/Verbal cueing   Problem: RH Light Housekeeping Goal: LTG Patient will perform light housekeeping w/assist (OT) Description: LTG: Patient will perform light housekeeping with assistance, with/without cues (OT). Flowsheets (Taken 04/23/2024 1244) LTG: Pt will perform light housekeeping with assistance level of: Supervision/Verbal cueing    Problem: RH Toilet Transfers Goal: LTG Patient will perform toilet transfers w/assist (OT) Description: LTG: Patient will perform toilet transfers with assist, with/without cues using equipment (OT) Flowsheets (Taken 04/23/2024 1245) LTG: Pt will perform toilet transfers with assistance  level of: Independent with assistive device   Problem: RH Tub/Shower Transfers Goal: LTG Patient will perform tub/shower transfers w/assist (OT) Description: LTG: Patient will perform tub/shower transfers with assist, with/without cues using equipment (OT) Flowsheets (Taken 04/23/2024 1245) LTG: Pt will perform tub/shower stall transfers with assistance level of: Supervision/Verbal cueing LTG: Pt will perform tub/shower transfers from: Tub/shower combination

## 2024-04-23 NOTE — Progress Notes (Signed)
 This chaplain responded to unit consult for creating the Pt. Advance Directive.   Education was started by the chaplain with the Pt. and friend at the Pt. bedside. The chaplain understands the Pt. will talk to her husband about best next steps in completing a AD. The blank AD was left with the Pt. and plans were made to page spiritual care through the RN if assistance is needed.  Chaplain Leeroy Hummer [phone]

## 2024-04-23 NOTE — Plan of Care (Signed)
  Problem: RH Comprehension Communication Goal: LTG Patient will comprehend basic/complex auditory (SLP) Description: LTG: Patient will comprehend basic/complex auditory information with cues (SLP). Flowsheets (Taken 04/23/2024 1707) LTG: Patient will comprehend: Complex auditory information LTG: Patient will comprehend auditory information with cueing (SLP): Supervision   Problem: RH Expression Communication Goal: LTG Patient will verbally express basic/complex needs(SLP) Description: LTG:  Patient will verbally express basic/complex needs, wants or ideas with cues  (SLP) Flowsheets (Taken 04/23/2024 1707) LTG: Patient will verbally express basic/complex needs, wants or ideas (SLP): Supervision Goal: LTG Patient will increase word finding of common (SLP) Description: LTG:  Patient will increase word finding of common objects/daily info/abstract thoughts with cues using compensatory strategies (SLP). Flowsheets (Taken 04/23/2024 1707) LTG: Patient will increase word finding of common (SLP): Supervision Patient will use compensatory strategies to increase word finding of:  Abstract thoughts  Daily info  Common objects

## 2024-04-23 NOTE — Progress Notes (Signed)
 Occupational Therapy Session Note  Patient Details  Name: Jeanne Ellison MRN: [id number] Date of Birth: 1940/08/11  Today's Date: 04/23/2024 OT Individual Time: 1330-1400 OT Individual Time Calculation (min): 30 min    Short Term Goals: Week 1:  OT Short Term Goal 1 (Week 1): STGs = LTGs  Skilled Therapeutic Interventions/Progress Updates:  Pt resting in bed upon arrival with husband, dtr, and granddtr present. Skilled OT intervention with focus on review of home setup and DME recommendations, functional amb without AD, and sitting balance to increase independence with BADLS. All bed mobility with supervsion. No report of vertigo. Sit<>stand with CGA. Amb in room with HHA. No reports of dizziness. Pt remained in bed with all needs within each. Family present. Bed alarm activated.   Therapy Documentation Precautions:  Precautions Precautions: Fall Precaution/Restrictions Comments: SBP <150 Restrictions Weight Bearing Restrictions Per Provider Order: No   Pain: Pt denies pain this afternoon   Therapy/Group: Individual Therapy  Maritza Debby Mare 04/23/2024, 2:57 PM

## 2024-04-23 NOTE — Evaluation (Signed)
 Speech Language Pathology Assessment and Plan  Patient Details  Name: Jeanne Ellison MRN: [id number] Date of Birth: 06/23/1940  SLP Diagnosis: Aphasia  Rehab Potential: Excellent ELOS: 7 days    Today's Date: 04/23/2024 SLP Individual Time: 8894-8798 SLP Individual Time Calculation (min): 56 min   Hospital Problem: Principal Problem:   TBI (traumatic brain injury) (HCC)  Past Medical History:  Past Medical History:  Diagnosis Date   Diabetes mellitus without complication (HCC)    Type II   Hypertension    Past Surgical History:  Past Surgical History:  Procedure Laterality Date   ABDOMINAL HYSTERECTOMY     APPENDECTOMY     CRANIOTOMY Left 04/11/2024   Procedure: CRANIOTOMY HEMATOMA EVACUATION SUBDURAL;  Surgeon: Lanis Pupa, MD;  Location: [facility] OR;  Service: Neurosurgery;  Laterality: Left;    Assessment / Plan / Recommendation Clinical Impression Pt is an 83 y.o. female with a PMHx of vertigo, HTN, DM2, anxiety, HLD, arthritis, and GERD, who presented to [facility] on 04/09/24 after ground level found, found down with LKW around 8am. Was confused and agitated, intubated in the ED, and work up revealed L holohemispheric SDH with 3mm midline shift, no associated skull fx. CT C/A/P neg for acute injury, just showed atherosclerosis, colonic diverticulosis, hiatal hernia, and mild cardiomegaly. She was extubated 04/10/24, however on 04/11/24 she became more confused and repeat CTH showing increased L SDH with increased midline shift, therefore she underwent L temporoparietal craniotomy by Dr. Lanis. Initially improving however on 04/15/24 she developed hypoxia and Afib with RVR, was reintubated, and PCCM consulted. Afib RVR treated with Amio but converted back to NSR post intubation. CXR with pulm edema, treated empirically for aspiration with Unasyn , which was d/c'd 11/19 after improvement/neg respiratory cultures. Also treated with intermittent Lasix , improved, extubated again  on 04/16/24.   Cognitive-Linguistic: Pt presented w/ only mild aphasia overall. Simple word expressive language WFL, only mild deficits noted w/ complex/specific naming tasks. Similar success noted w/ written expression, as only mild spelling deficits were noted along with formulation difficulties at sentence level. Mild deficits noted w/ 3 step directions; auditory comprehension WFL otherwise. Unable to test reading comprehension this date given need for large print. Will assess in upcoming tx sessions. Functional problem solving and attention WFL. No concern for cognitive deficits at this time. Additionally, speech remained clear and intelligible throughout. Recommend skilled ST services to target mild aphasia, maximize pt communication and subsequent independence, and facilitate return to prev roles/responsibilities.   Bedside Swallow: No overt s/s of airway invasion noted w/ regular textures and thin liquids via straw. She reported globus sensation at baseline w/ bread. Hx of GERD noted and SLP provided education re reflux precautions. She reported no increase in globus sensation s/p hospitalization. No dysphagia intervention required at this time, however, pt may benefit from additional education re GERD.     Skilled Therapeutic Interventions          SLP facilitated a cognitive-linguistic evaluation and brief bedside swallow screen to assess pt's cognitive-communication skills and determine need for additional skilled ST services. See above for more information.    SLP Assessment  Patient will need skilled Speech Lanaguage Pathology Services during CIR admission    Recommendations  SLP Diet Recommendations: Age appropriate regular solids;Thin Liquid Administration via: Straw Medication Administration: Whole meds with liquid Compensations: Slow rate;Small sips/bites Postural Changes and/or Swallow Maneuvers: Seated upright 90 degrees;Upright 30-60 min after meal Oral Care Recommendations: Oral  care BID Recommendations for Other Services: Therapeutic Recreation  consult Therapeutic Recreation Interventions: Pet therapy Patient destination: Home Follow up Recommendations: Outpatient SLP;Home Health SLP Equipment Recommended: None recommended by SLP    SLP Frequency 3 to 5 out of 7 days   SLP Duration  SLP Intensity  SLP Treatment/Interventions 7 days  Minumum of 1-2 x/day, 30 to 90 minutes  Multimodal communication approach;Speech/Language facilitation;Cueing hierarchy;Functional tasks;Therapeutic Activities;Patient/family education    Pain  None reported  Prior Functioning  independent  SLP Evaluation Cognition Overall Cognitive Status: Within Functional Limits for tasks assessed Arousal/Alertness: Awake/alert Orientation Level: Oriented X4 Year: 2025 Month: November Day of Week: Correct Attention: Sustained Sustained Attention: Appears intact Memory: Appears intact Problem Solving: Appears intact  Comprehension Auditory Comprehension Overall Auditory Comprehension: Impaired Yes/No Questions: Within Functional Limits Commands: Impaired One Step Basic Commands: 75-100% accurate Two Step Basic Commands: 75-100% accurate Multistep Basic Commands: 75-100% accurate Conversation: Complex Interfering Components: Hearing EffectiveTechniques: Increased volume;Stressing words;Slowed speech Visual Recognition/Discrimination Discrimination: Not tested Reading Comprehension Reading Status: Not tested Expression Expression Primary Mode of Expression: Verbal Verbal Expression Overall Verbal Expression: Impaired Initiation: No impairment Repetition: Impaired Level of Impairment: Sentence level Naming: Impairment Responsive: 76-100% accurate Confrontation: Within functional limits Divergent: 75-100% accurate Pragmatics: No impairment Written Expression Dominant Hand: Right Written Expression: Exceptions to [facility] Self Formulation Ability: Phrase Oral  Motor Oral Motor/Sensory Function Overall Oral Motor/Sensory Function: Within functional limits Motor Speech Overall Motor Speech: Appears within functional limits for tasks assessed  Care Tool Care Tool Cognition Ability to hear (with hearing aid or hearing appliances if normally used Ability to hear (with hearing aid or hearing appliances if normally used): 0. Adequate - no difficulty in normal conservation, social interaction, listening to TV   Expression of Ideas and Wants Expression of Ideas and Wants: 3. Some difficulty - exhibits some difficulty with expressing needs and ideas (e.g, some words or finishing thoughts) or speech is not clear   Understanding Verbal and Non-Verbal Content Understanding Verbal and Non-Verbal Content: 3. Usually understands - understands most conversations, but misses some part/intent of message. Requires cues at times to understand  Memory/Recall Ability Memory/Recall Ability : Current season;That he or she is in a hospital/hospital unit   Motor Speech Assessment  WFL  Bedside Swallowing Assessment General Diet Prior to this Study: Regular;Thin liquids (Level 0) Temperature Spikes Noted: No Respiratory Status: Room air Behavior/Cognition: Alert;Cooperative;Pleasant mood Oral Cavity - Dentition: Adequate natural dentition Self-Feeding Abilities: Able to feed self Vision: Functional for self-feeding Patient Positioning: Upright in bed Baseline Vocal Quality: Normal   Thin Liquid Thin Liquid: Within functional limits  Solid Solid: Within functional limits Presentation: Self Fed   Short Term Goals: Week 1: SLP Short Term Goal 1 (Week 1): STGs = LTGs d/t ELOS  Refer to Care Plan for Long Term Goals  Recommendations for other services: Therapeutic Recreation  Pet therapy  Discharge Criteria: Patient will be discharged from SLP if patient refuses treatment 3 consecutive times without medical reason, if treatment goals not met, if there is a  change in medical status, if patient makes no progress towards goals or if patient is discharged from hospital.  The above assessment, treatment plan, treatment alternatives and goals were discussed and mutually agreed upon: by patient and by family  [name] 04/23/2024, 4:57 PM

## 2024-04-23 NOTE — Plan of Care (Signed)
  Problem: Consults Goal: RH BRAIN INJURY PATIENT EDUCATION Description: Description: See Patient Education module for eduction specifics Outcome: Progressing   Problem: RH BOWEL ELIMINATION Goal: RH STG MANAGE BOWEL WITH ASSISTANCE Description: STG Manage Bowel with supervision Assistance. Outcome: Progressing   Problem: RH BLADDER ELIMINATION Goal: RH STG MANAGE BLADDER WITH ASSISTANCE Description: STG Manage Bladder With supervision Assistance Outcome: Progressing   Problem: RH SKIN INTEGRITY Goal: RH STG SKIN FREE OF INFECTION/BREAKDOWN Description: Manage skin free from infection with supervision assistance Outcome: Progressing   Problem: RH SAFETY Goal: RH STG ADHERE TO SAFETY PRECAUTIONS W/ASSISTANCE/DEVICE Description: STG Adhere to Safety Precautions With supervision Assistance/Device. Outcome: Progressing   Problem: RH PAIN MANAGEMENT Goal: RH STG PAIN MANAGED AT OR BELOW PT'S PAIN GOAL Description: <4 w/ prns Outcome: Progressing   Problem: RH KNOWLEDGE DEFICIT BRAIN INJURY Goal: RH STG INCREASE KNOWLEDGE OF SELF CARE AFTER BRAIN INJURY Description: Manage knowledge of self care after brain injury with supervision assistance using educational materials provided Outcome: Progressing

## 2024-04-24 DIAGNOSIS — D62 Acute posthemorrhagic anemia: Secondary | ICD-10-CM | POA: Diagnosis not present

## 2024-04-24 DIAGNOSIS — I10 Essential (primary) hypertension: Secondary | ICD-10-CM | POA: Diagnosis not present

## 2024-04-24 DIAGNOSIS — E119 Type 2 diabetes mellitus without complications: Secondary | ICD-10-CM | POA: Diagnosis not present

## 2024-04-24 DIAGNOSIS — S069XAD Unspecified intracranial injury with loss of consciousness status unknown, subsequent encounter: Secondary | ICD-10-CM | POA: Diagnosis not present

## 2024-04-24 LAB — CBC
HCT: 24.9 % — ABNORMAL LOW (ref 36.0–46.0)
Hemoglobin: 8 g/dL — ABNORMAL LOW (ref 12.0–15.0)
MCH: 28.5 pg (ref 26.0–34.0)
MCHC: 32.1 g/dL (ref 30.0–36.0)
MCV: 88.6 fL (ref 80.0–100.0)
Platelets: 488 K/uL — ABNORMAL HIGH (ref 150–400)
RBC: 2.81 MIL/uL — ABNORMAL LOW (ref 3.87–5.11)
RDW: 17.5 % — ABNORMAL HIGH (ref 11.5–15.5)
WBC: 11.9 K/uL — ABNORMAL HIGH (ref 4.0–10.5)
nRBC: 0 % (ref 0.0–0.2)

## 2024-04-24 LAB — URINALYSIS, W/ REFLEX TO CULTURE (INFECTION SUSPECTED)
Bilirubin Urine: NEGATIVE
Glucose, UA: NEGATIVE mg/dL
Hgb urine dipstick: NEGATIVE
Ketones, ur: NEGATIVE mg/dL
Nitrite: NEGATIVE
Protein, ur: NEGATIVE mg/dL
Specific Gravity, Urine: 1.008 (ref 1.005–1.030)
WBC, UA: >50 WBC/hpf (ref 0–5)
pH: 7 (ref 5.0–8.0)

## 2024-04-24 LAB — GLUCOSE, CAPILLARY
Glucose-Capillary: 160 mg/dL — ABNORMAL HIGH (ref 70–99)
Glucose-Capillary: 204 mg/dL — ABNORMAL HIGH (ref 70–99)
Glucose-Capillary: 93 mg/dL (ref 70–99)
Glucose-Capillary: 145 mg/dL — ABNORMAL HIGH (ref 70–99)

## 2024-04-24 MED ORDER — CEPHALEXIN 250 MG PO CAPS
500.0000 mg | ORAL_CAPSULE | Freq: Once | ORAL | Status: AC
  Administered 2024-04-24: 500 mg via ORAL
  Filled 2024-04-24: qty 2

## 2024-04-24 MED ORDER — CEPHALEXIN 250 MG PO CAPS
500.0000 mg | ORAL_CAPSULE | Freq: Two times a day (BID) | ORAL | Status: DC
  Filled 2024-04-24: qty 2

## 2024-04-24 MED ORDER — CEPHALEXIN 250 MG PO CAPS
500.0000 mg | ORAL_CAPSULE | Freq: Two times a day (BID) | ORAL | Status: AC
  Administered 2024-04-24 – 2024-04-28 (×9): 500 mg via ORAL
  Filled 2024-04-24 (×8): qty 2

## 2024-04-24 MED ADMIN — Dextromethorphan-Guaifenesin Syrup 10-100 MG/5ML: 10 mL | ORAL | NDC 00121127610

## 2024-04-24 MED ADMIN — Hydralazine HCl Tab 50 MG: 50 mg | ORAL | NDC 23155083401

## 2024-04-24 MED ADMIN — Nutritional Supplement Liquid: 237 mL | ORAL | NDC 7007464928

## 2024-04-24 MED ADMIN — Rosuvastatin Calcium Tab 5 MG: 10 mg | ORAL | NDC 65862029390

## 2024-04-24 MED ADMIN — Cholecalciferol Tab 25 MCG (1000 Unit): 1000 [IU] | ORAL | NDC 2055503300

## 2024-04-24 MED ADMIN — Insulin Aspart Inj Soln 100 Unit/ML: 3 [IU] | SUBCUTANEOUS | NDC 73070010011

## 2024-04-24 MED ADMIN — Insulin Aspart Inj Soln 100 Unit/ML: 2 [IU] | SUBCUTANEOUS | NDC 73070010011

## 2024-04-24 MED ADMIN — Insulin Aspart Inj Soln 100 Unit/ML: 5 [IU] | SUBCUTANEOUS | NDC 73070010011

## 2024-04-24 MED ADMIN — Acetaminophen Tab 500 MG: 1000 mg | ORAL | NDC 50580045711

## 2024-04-24 MED ADMIN — THERA M PLUS PO TABS: 1 | ORAL | NDC 1650058694

## 2024-04-24 MED ADMIN — Thiamine Mononitrate Tab 100 MG: 100 mg | ORAL | NDC 54629005701

## 2024-04-24 MED ADMIN — Ascorbic Acid Tab 500 MG: 500 mg | ORAL | NDC 00904052361

## 2024-04-24 MED ADMIN — Carvedilol Tab 25 MG: 25 mg | ORAL | NDC 72888003701

## 2024-04-24 MED ADMIN — Losartan Potassium Tab 50 MG: 100 mg | ORAL | NDC 68180037709

## 2024-04-24 MED ADMIN — Enoxaparin Sodium Inj Soln Pref Syr 30 MG/0.3ML: 30 mg | SUBCUTANEOUS | NDC 71288043280

## 2024-04-24 MED ADMIN — Amlodipine Besylate Tab 10 MG (Base Equivalent): 10 mg | ORAL | NDC 68382012316

## 2024-04-24 MED ADMIN — Melatonin Tab 3 MG: 3 mg | ORAL | NDC 3160402741

## 2024-04-24 MED ADMIN — Citalopram Hydrobromide Tab 20 MG (Base Equiv): 20 mg | ORAL | NDC 00904608561

## 2024-04-24 MED ADMIN — Tramadol HCl Tab 50 MG: 50 mg | ORAL | NDC 60687079511

## 2024-04-24 NOTE — Progress Notes (Signed)
   04/24/24 1455  Spiritual Encounters  Type of Visit Initial  Care provided to: Patient;Family;Pt and family  Referral source Patient request  Reason for visit Advance directives  OnCall Visit No   11/25- Chaplain Educated patient about AD and upon completion patient will contact [facility]

## 2024-04-24 NOTE — Plan of Care (Signed)
  Problem: Consults Goal: RH BRAIN INJURY PATIENT EDUCATION Description: Description: See Patient Education module for eduction specifics Outcome: Progressing   Problem: RH BOWEL ELIMINATION Goal: RH STG MANAGE BOWEL WITH ASSISTANCE Description: STG Manage Bowel with supervision Assistance. Outcome: Progressing   Problem: RH BLADDER ELIMINATION Goal: RH STG MANAGE BLADDER WITH ASSISTANCE Description: STG Manage Bladder With supervision Assistance Outcome: Progressing   Problem: RH SKIN INTEGRITY Goal: RH STG SKIN FREE OF INFECTION/BREAKDOWN Description: Manage skin free from infection with supervision assistance Outcome: Progressing   Problem: RH SAFETY Goal: RH STG ADHERE TO SAFETY PRECAUTIONS W/ASSISTANCE/DEVICE Description: STG Adhere to Safety Precautions With supervision Assistance/Device. Outcome: Progressing   Problem: RH PAIN MANAGEMENT Goal: RH STG PAIN MANAGED AT OR BELOW PT'S PAIN GOAL Description: <4 w/ prns Outcome: Progressing   Problem: RH KNOWLEDGE DEFICIT BRAIN INJURY Goal: RH STG INCREASE KNOWLEDGE OF SELF CARE AFTER BRAIN INJURY Description: Manage knowledge of self care after brain injury with supervision assistance using educational materials provided Outcome: Progressing

## 2024-04-24 NOTE — Progress Notes (Signed)
 Physical Therapy Note  Patient Details  Name: Jeanne Ellison MRN: [id number] Date of Birth: 23-May-1941 Today's Date: 04/24/2024   Physical Therapist participated in the interdisciplinary team conference, providing clinical information regarding the patient's current status, treatment goals, and weekly focus, including any barriers that need to be addressed. Please see the Inpatient Rehabilitation Team Conference and Plan of Care Update for further details.    Elsie JAYSON Dawn, PT, DPT 04/24/2024, 10:05 AM

## 2024-04-24 NOTE — Progress Notes (Signed)
 Physical Therapy Session Note  Patient Details  Name: Jeanne Ellison MRN: [id number] Date of Birth: [birth year]  Today's Date: 04/24/2024 PT Individual Time: 1105-1202 PT Individual Time Calculation (min): 57 min   Short Term Goals: Week 1:  PT Short Term Goal 1 (Week 1): STG = LTG d/t ELOS  Skilled Therapeutic Interventions/Progress Updates:     Pt received supine in bed and agrees to therapy. Reports pain possible related to UTI. Number not provided. PT provides rest breaks as needed and alerts RN to pain. Pt performs bed mobility with cues for sequencing and positioning. PT assists to don socks and shoes at EOB. Pt performs stand step from bed>WC>Nustep without AD and with CGA, with cues for positioning. Pt completes Nustep for endurance training and challenging attention to task> pt completes x10:00 at workload of 5 with average steps per minute ~34. PT provides cues for hand and foot placement and completing full available ROM. Following, pt ambulates x26' with RW and CGA, with cues to monitor physiologic response to activity to improve safety awareness. Pt reports mild fatigue following bout of ambulation.   BP assessed in sitting during rest break: 124/49. Pt then stand and BP taken in standing with drop to 107/40.  PT has extended discussion with pt and pt's son regarding strategies for safe mobility following discharge, including increasing fluid intake and overall activity level.  Pt stands and ambulates x175' without AD, with cues for trunk rotation and arm swing to improve balance. Pt requires minA overall, but one instance of modA due to slight LOB. Following, pt left seated in [facility] with all needs within reach.  Therapy Documentation Precautions:  Precautions Precautions: Fall Precaution/Restrictions Comments: SBP <150, stitches out 11/26 Restrictions Weight Bearing Restrictions Per Provider Order: No    Therapy/Group: Individual Therapy  Elsie JAYSON Dawn, PT,  DPT 04/24/2024, 3:49 PM

## 2024-04-24 NOTE — Progress Notes (Signed)
 Speech Language Pathology Note  Patient Details  Name: Jeanne Ellison MRN: [id number] Date of Birth: 1940-06-26 Today's Date: 04/24/2024  No ABS or behavior plan needed given no behaviors present @ time of eval   [name] 04/24/2024, 10:40 AM

## 2024-04-24 NOTE — Progress Notes (Signed)
 Inpatient Rehabilitation Care Coordinator Assessment and Plan Patient Details  Name: Jeanne Ellison MRN: [id number] Date of Birth: [birth year]  Today's Date: 04/24/2024  Hospital Problems: Principal Problem:   TBI (traumatic brain injury) [facility]  Past Medical History:  Past Medical History:  Diagnosis Date   Diabetes mellitus without complication (HCC)    Type II   Hypertension    Past Surgical History:  Past Surgical History:  Procedure Laterality Date   ABDOMINAL HYSTERECTOMY     APPENDECTOMY     CRANIOTOMY Left 04/11/2024   Procedure: CRANIOTOMY HEMATOMA EVACUATION SUBDURAL;  Surgeon: Lanis Pupa, MD;  Location: [facility] OR;  Service: Neurosurgery;  Laterality: Left;   Social History:  reports that she has never smoked. She has never used smokeless tobacco. She reports that she does not currently use alcohol. She reports that she does not use drugs.  Family / Support Systems Marital Status: Married How Long?: 65 years Patient Roles: Spouse, Parent Spouse/Significant Other: Gaither (husbamnd) [phone] Children: 3 children- Alan (dtr; lives 10 mi from their home), Alm Locus (lives in [place]), and Christopher Ruth [place] land[place]) Other Supports: PRN support from granddaughter Anticipated Caregiver: Husband will be primary caregiver Ability/Limitations of Caregiver: Pt will d/c to home with her husband who will be primary caregiver. Caregiver Availability: 24/7 Family Dynamics: Pt lives with her husband.  Social History Preferred language: English Religion:  Cultural Background: Pt reports that she worked in a [employer], and lastly worked in haematologist. 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   Abuse/Neglect Abuse/Neglect Assessment Can Be Completed: Yes Physical Abuse: Denies Verbal Abuse: Denies Sexual Abuse: Denies Exploitation of patient/patient's resources: Denies Self-Neglect: Denies  Patient response to: Social Isolation - How often do you feel lonely or isolated from those around you?: Never  Emotional Status Pt's affect, behavior and adjustment status: Pt in good spirits at time of visit. Confused at times and accepts correction from family. Recent Psychosocial Issues: Denies Psychiatric History: Pt admits that she has anxiety and precsribed by her PCP Substance Abuse History: Denies  Patient / Family Perceptions, Expectations & Goals Pt/Family understanding of illness & functional limitations: Pt and family have a general understanding of care needs Premorbid pt/family roles/activities: Independent Anticipated changes in roles/activities/participation: Assistance with ADLs/iADLs Pt/family expectations/goals: Pt goal is to work on getting around and get brain back. family would like for her to be as independent as possible, and get around home well and safely.  Community Resources [employer]: None Premorbid Home Care/DME Agencies: None Transportation available at discharge: TBD Is the patient able to respond to transportation needs?: Yes In the past 12 months, has lack of transportation kept you from medical appointments or from getting medications?: No In the past 12 months, has lack of transportation kept you from meetings, work, or from getting things needed for daily living?: No Resource referrals recommended: Neuropsychology  Discharge Planning Living Arrangements: Spouse/significant other Support Systems: Spouse/significant other, Children, Other relatives Type of Residence: Private residence Insurance Resources: Media Planner (specify) ([employer]) Financial  Resources: Restaurant Manager, Fast Food Screen Referred: No Living Expenses: Banker Management: Spouse Does the patient have any problems obtaining your medications?: No Home Management: Pt manages allhome care needs;  pt husband does help assist with cleaning as well Patient/Family Preliminary Plans: TBD Care Coordinator Barriers to Discharge: Decreased caregiver support, Lack of/limited family support, Insurance for SNF coverage Care Coordinator Anticipated Follow Up Needs: HH/OP  Clinical Impression SW met with pt, pt husband, and pt dtr Alan and granddaughter to introduce self, explain role, and discuss discharge process. 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 No HCPOA. DME- cane (uses once and awhile), RW (husband has one), friend that lives in the home will allow her to use shower bench. SW will follow-up with updates.   Quanisha Drewry A Jeane Cashatt 04/24/2024, 10:27 AM

## 2024-04-24 NOTE — Care Management (Signed)
 Inpatient Rehabilitation Center Individual Statement of Services  Patient Name:  Jeanne Ellison  Date:  04/24/2024  Welcome to the Inpatient Rehabilitation Center.  Our goal is to provide you with an individualized program based on your diagnosis and situation, designed to meet your specific needs.  With this comprehensive rehabilitation program, you will be expected to participate in at least 3 hours of rehabilitation therapies Monday-Friday, with modified therapy programming on the weekends.  Your rehabilitation program will include the following services:  Physical Therapy (PT), Occupational Therapy (OT), 24 hour per day rehabilitation nursing, Therapeutic Recreaction (TR), Psychology, Neuropsychology, Care Coordinator, Rehabilitation Medicine, Nutrition Services, Pharmacy Services, and Other  Weekly team conferences will be held on Tuesday to discuss your progress.  Your Inpatient Rehabilitation Care Coordinator will talk with you frequently to get your input and to update you on team discussions.  Team conferences with you and your family in attendance may also be held.  Expected length of stay: 7 days    Overall anticipated outcome: Supervision  Depending on your progress and recovery, your program may change. Your Inpatient Rehabilitation Care Coordinator will coordinate services and will keep you informed of any changes. Your Inpatient Rehabilitation Care Coordinator's name and contact numbers are listed  below.  The following services may also be recommended but are not provided by the Inpatient Rehabilitation Center:  Driving Evaluations Home Health Rehabiltiation Services Outpatient Rehabilitation Services Vocational Rehabilitation   Arrangements will be made to provide these services after discharge if needed.  Arrangements include referral to agencies that provide these services.  Your insurance has been verified to be:  [employer]  Your primary doctor is:   Tracey Bathe  Pertinent information will be shared with your doctor and your insurance company.  Inpatient Rehabilitation Care Coordinator:  Graeme Jude, KEN [phone] or [phone]  Information discussed with and copy given to patient by: [name], 04/24/2024, 10:02 AM

## 2024-04-24 NOTE — Patient Care Conference (Addendum)
 Inpatient RehabilitationTeam Conference and Plan of Care Update Date: 04/24/2024   Time: 1009 am    Patient Name: Jeanne Ellison      Medical Record Number: [id number]  Date of Birth: 1940/07/22 Sex: Female         Room/Bed: 5T95R/5T95R-98 Payor Info: Payor: [employer] / Plan: [employer] PPO / Product Type: *No Product type* /    Admit Date/Time:  04/22/2024  7:30 PM  Primary Diagnosis:  TBI (traumatic brain injury) [facility])  Hospital Problems: Principal Problem:   TBI (traumatic brain injury) [facility])    Expected Discharge Date: Expected Discharge Date: 04/29/24  Team Members Present: Physician leading conference: Dr. Arthea Gunther Social Worker Present: Graeme Jude, LCSW Nurse Present: Eulalio Falls, RN PT Present: Kirt Dawn, PT OT Present: Leita Howell, OT SLP Present: Recardo Mole, SLP     Current Status/Progress Goal Weekly Team Focus  Bowel/Bladder   Patient is continent of bowel and bladder. Last BM 11/24.   Patient will remain continent of bowel and bladder.   Assist patient to the bathroom in a timely manner to maintain continence.    Swallow/Nutrition/ Hydration   regular/thin - no dysphagia intervention warranted           ADL's   CGA overall for ADLs and transfers, limited by fatigue   Mod I ADLs, (S) IADLS   Endurance, ADL retraining, transfers, balance, safety    Mobility   CGA overall, ambulating [phone] with RW   Supervision  balance, transfers, ambulation, endurance    Communication   mild aphasia - complex expressive and receptive language only   supervision   pt/family education, mildly complex language tasks    Safety/Cognition/ Behavioral Observations               Pain   Patient reports head and neck pain. Treating with scheduled tylenol  and PRN ultram .   Patient will report a pain score of less than 4/10.   Assess pain every shift and as needed. Provide pain interventions when necessary.     Skin   Surgical incision to left side of head with staples. No drainage noted and edges are approximated. Small scratch to left elbow and left shin - scabbed and OTA. Scattered bruising.   Continued healing of surgical incision to head with staple removal when appropriate. Prevention of further skin breakdown.  Assess skin every shift and as needed. Keep surgical incision clean and dry. Monitor for signs of infection.      Discharge Planning:  Pt will dischagre to home with her husband and PRN support from dtr and son. SW will confirm there are no barriers to discharge.    Team Discussion: Patient was admitted post left fronto parietal craniotomy due to left SDH secondary to fall. Patient with headaches/ vertigo/ leukocytosis: medications and treatments adjusted by MD. Patient progress limited by fatigue and mild aphasia.   Patient on target to meet rehab goals: Currently patient needs CGA with all ADLs and transfers. Patient  ambulating 55' with CGA using a rolling walker. Overall goals at discharge are set for supervision assistance.   *See Care Plan and progress notes for long and short-term goals.   Revisions to Treatment Plan:  Vestibular assessment   Teaching Needs: Safety, medications, transfers, toileting, etc.   Current Barriers to Discharge: Decreased caregiver support and Home enviroment access/layout  Possible Resolutions to Barriers: Family Education Outpatient follow-up DME: patient has cane  and walker at home     Medical Summary  Current Status: s/p fall with left SDh. hx of vertigo, vestibular eval pendijng. leukocytosis, ?UTI. dm, afib  Barriers to Discharge: Medical stability   Possible Resolutions to Barriers/Weekly Focus: follow up labwork, ua,ucx. adjusting meds for bp as needed. maximize sleep   Continued Need for Acute Rehabilitation Level of Care: The patient requires daily medical management by a physician with specialized training in physical  medicine and rehabilitation for the following reasons: Direction of a multidisciplinary physical rehabilitation program to maximize functional independence : Yes Medical management of patient stability for increased activity during participation in an intensive rehabilitation regime.: Yes Analysis of laboratory values and/or radiology reports with any subsequent need for medication adjustment and/or medical intervention. : Yes   I attest that I was present, lead the team conference, and concur with the assessment and plan of the team.   Aslan Himes Gayo 04/24/2024, 1009 am

## 2024-04-24 NOTE — IPOC Note (Signed)
 Overall Plan of Care [facility]) Patient Details Name: Jeanne Ellison MRN: [id number] DOB: 03-24-1941  Admitting Diagnosis: TBI (traumatic brain injury) [facility])  Hospital Problems: Principal Problem:   TBI (traumatic brain injury) (HCC)     Functional Problem List: Nursing Bladder, Bowel, Edema, Endurance, Medication Management, Pain, Safety, Skin Integrity  PT Balance, Pain, Endurance, Safety  OT Balance, Endurance, Motor  SLP Linguistic  TR         Basic ADL's: OT Bathing, Dressing, Toileting     Advanced  ADL's: OT Simple Meal Preparation, Light Housekeeping     Transfers: PT Bed Mobility, Bed to Chair, Car, Occupational Psychologist, Research Scientist (life Sciences): PT Ambulation, Stairs     Additional Impairments: OT None  SLP Communication expression, comprehension    TR      Anticipated Outcomes Item Anticipated Outcome  Self Feeding independent  Swallowing      Basic self-care  mod ind  Toileting  mod ind   Bathroom Transfers mod ind  Bowel/Bladder  manage bowels with medications/ manage bladder with pvr's /toileting assistance  Transfers  ModI/ supervision  Locomotion  Supervision  Communication  supervision  Cognition     Pain  <4 w/ prns  Safety/Judgment  Manage safety with supervision assistance   Therapy Plan: PT Intensity: Minimum of 1-2 x/day ,45 to 90 minutes PT Frequency: 5 out of 7 days PT Duration Estimated Length of Stay: 5-7 days OT Intensity: Minimum of 1-2 x/day, 45 to 90 minutes OT Duration/Estimated Length of Stay: 5-7 days SLP Intensity: Minumum of 1-2 x/day, 30 to 90 minutes SLP Frequency: 3 to 5 out of 7 days SLP Duration/Estimated Length of Stay: 7 days   Team Interventions: Nursing Interventions Patient/Family Education, Pain Management, Bladder Management, Bowel Management, Disease Management/Prevention, Skin Care/Wound Management, Medication Management, Discharge Planning  PT interventions Ambulation/gait training,  Cognitive remediation/compensation, Discharge planning, DME/adaptive equipment instruction, Functional mobility training, Pain management, Psychosocial support, Therapeutic Activities, UE/LE Strength taining/ROM, Warden/ranger, Community reintegration, Neuromuscular re-education, Patient/family education, Skin care/wound management, Stair training, Therapeutic Exercise, UE/LE Coordination activities  OT Interventions Warden/ranger, Discharge planning, DME/adaptive equipment instruction, Functional mobility training, Patient/family education, Neuromuscular re-education, Psychosocial support, Self Care/advanced ADL retraining, Therapeutic Activities, Therapeutic Exercise, UE/LE Strength taining/ROM  SLP Interventions Multimodal communication approach, Speech/Language facilitation, Cueing hierarchy, Functional tasks, Therapeutic Activities, Patient/family education  TR Interventions    SW/CM Interventions Discharge Planning, Psychosocial Support, Patient/Family Education   Barriers to Discharge MD  Medical stability  Nursing Decreased caregiver support, Home environment access/layout, Incontinence Discharge: House  Discharge Home Layout: One level  Discharge Home Access: Stairs to enter  Entrance Stairs-Rails: Right, Left  Entrance Stairs-Number of Steps: 3  PT Decreased caregiver support, Lack of/limited family support, Insurance for SNF coverage    OT None    SLP      SW Decreased caregiver support, Lack of/limited family support, Community Education Officer for SNF coverage     Team Discharge Planning: Destination: PT-Home ,OT- Home , SLP-Home Projected Follow-up: PT-Home health PT, Outpatient PT, 24 hour supervision/assistance, OT-  Home health OT, SLP-Outpatient SLP, Home Health SLP Projected Equipment Needs: PT-To be determined, OT- Tub/shower bench, SLP-None recommended by SLP Equipment Details: PT- , OT-  Patient/family involved in discharge planning: PT- Patient, Family  member/caregiver,  OT-Patient, SLP-Patient, Family member/caregiver  MD ELOS: 11/30 Medical Rehab Prognosis:  Excellent Assessment: The patient has been admitted for CIR therapies with the diagnosis of TBI. The team will be addressing functional mobility, strength, stamina,  balance, safety, adaptive techniques and equipment, self-care, bowel and bladder mgt, patient and caregiver education, vestibular assessment, community reentry, language and cognition. Goals have been set at mod I to supervision. Anticipated discharge destination is home with family.        See Team Conference Notes for weekly updates to the plan of care

## 2024-04-24 NOTE — Progress Notes (Signed)
 Patient ID: Jeanne Ellison, female   DOB: 04/11/1941, 83 y.o.   MRN: [id number]   SW met with pt and pt husband in room to provide updates from team conference, and d/c darer 11/30. Discussed outpatient therapy for PT/OT/SLP. SW will send referral to [facility]. Pt son arrived during discussion, and SW provided him with updates. Fam edu on Fri treva Graeme Jude, MSW, LCSW Office: [phone] Cell: [phone] Fax: [phone]

## 2024-04-24 NOTE — Plan of Care (Signed)
  Problem: RH Balance Goal: LTG Patient will maintain dynamic standing balance (PT) Description: LTG:  Patient will maintain dynamic standing balance with assistance during mobility activities (PT) Flowsheets (Taken 04/23/2024 2316) LTG: Pt will maintain dynamic standing balance during mobility activities with:: Supervision/Verbal cueing   Problem: Sit to Stand Goal: LTG:  Patient will perform sit to stand with assistance level (PT) Description: LTG:  Patient will perform sit to stand with assistance level (PT) Flowsheets (Taken 04/23/2024 2316) LTG: PT will perform sit to stand in preparation for functional mobility with assistance level: Independent with assistive device   Problem: RH Bed Mobility Goal: LTG Patient will perform bed mobility with assist (PT) Description: LTG: Patient will perform bed mobility with assistance, with/without cues (PT). Flowsheets (Taken 04/23/2024 2316) LTG: Pt will perform bed mobility with assistance level of: Independent   Problem: RH Bed to Chair Transfers Goal: LTG Patient will perform bed/chair transfers w/assist (PT) Description: LTG: Patient will perform bed to chair transfers with assistance (PT). Flowsheets (Taken 04/23/2024 2316) LTG: Pt will perform Bed to Chair Transfers with assistance level: Independent with assistive device    Problem: RH Car Transfers Goal: LTG Patient will perform car transfers with assist (PT) Description: LTG: Patient will perform car transfers with assistance (PT). Flowsheets (Taken 04/23/2024 2316) LTG: Pt will perform car transfers with assist:: Supervision/Verbal cueing   Problem: RH Furniture Transfers Goal: LTG Patient will perform furniture transfers w/assist (OT/PT) Description: LTG: Patient will perform furniture transfers  with assistance (OT/PT). Flowsheets (Taken 04/23/2024 2316) LTG: Pt will perform furniture transfers with assist:: Supervision/Verbal cueing   Problem: RH Ambulation Goal: LTG  Patient will ambulate in home environment (PT) Description: LTG: Patient will ambulate in home environment, # of feet with assistance (PT). Flowsheets (Taken 04/23/2024 2316) LTG: Pt will ambulate in home environ  assist needed:: Supervision/Verbal cueing LTG: Ambulation distance in home environment: up to 50 ft per bout using LRAD Goal: LTG Patient will ambulate in community environment (PT) Description: LTG: Patient will ambulate in community environment, # of feet with assistance (PT). Flowsheets (Taken 04/23/2024 2316) LTG: Pt will ambulate in community environ  assist needed:: Supervision/Verbal cueing LTG: Ambulation distance in community environment: at least 300 ft using LRAD   Problem: RH Stairs Goal: LTG Patient will ambulate up and down stairs w/assist (PT) Description: LTG: Patient will ambulate up and down # of stairs with assistance (PT) Flowsheets (Taken 04/23/2024 2316) LTG: Pt will ambulate up/down stairs assist needed:: Supervision/Verbal cueing LTG: Pt will  ambulate up and down number of stairs: at least 5 steps using HR as per home environment

## 2024-04-24 NOTE — Evaluation (Signed)
 Physical Therapy Assessment and Plan  Patient Details  Name: Jeanne Ellison MRN: [id number] Date of Birth: [birth year]  PT Diagnosis: Difficulty walking, Muscle weakness, and Pain in head Rehab Potential: Excellent ELOS: 5-7 days   Today's Date: 04/24/2024 PT Individual Time: 0805-0905 PT Individual Time Calculation (min): 60 min    Hospital Problem: Principal Problem:   TBI (traumatic brain injury) (HCC)   Past Medical History:  Past Medical History:  Diagnosis Date   Diabetes mellitus without complication (HCC)    Type II   Hypertension    Past Surgical History:  Past Surgical History:  Procedure Laterality Date   ABDOMINAL HYSTERECTOMY     APPENDECTOMY     CRANIOTOMY Left 04/11/2024   Procedure: CRANIOTOMY HEMATOMA EVACUATION SUBDURAL;  Surgeon: Lanis Pupa, MD;  Location: [facility] OR;  Service: Neurosurgery;  Laterality: Left;    Assessment & Plan Clinical Impression: Patient is a 83 y.o. female with a PMHx of vertigo, HTN, DM2, anxiety, HLD, arthritis, and GERD, who presented to [facility] on 04/09/24 after ground level found, found down with LKW around 8am. Was confused and agitated, intubated in the ED, and work up revealed L holohemispheric SDH with 3mm midline shift, no associated skull fx. CT C/A/P neg for acute injury, just showed atherosclerosis, colonic diverticulosis, hiatal hernia, and mild cardiomegaly. She was extubated 04/10/24, however on 04/11/24 she became more confused and repeat CTH showing increased L SDH with increased midline shift, therefore she underwent L temporoparietal craniotomy by Dr. Lanis. Initially improving however on 04/15/24 she developed hypoxia and Afib with RVR, was reintubated, and PCCM consulted. Afib RVR treated with Amio but converted back to NSR post intubation. CXR with pulm edema, treated empirically for aspiration with Unasyn , which was d/c'd 11/19 after improvement/neg respiratory cultures. Also treated with intermittent Lasix ,  improved, extubated again on 04/16/24. EEGs negative for seizure. Completed 14d of Keppra  total, d/c'd on admission to CIR. Hospital course also complicated by urinary retention, foley replaced 11/14-20, but has been voiding spontaneously since then. Cortrak d/c'd 11/21. BPs requiring IV Clevidipine  but off since 11/20, now managed well with PO meds. Patient did endorse having vertigo which was chronic. Patient requiring comprehensive rehabilitation program due to functional decline. Patient transferred to CIR on 04/22/2024 .   Patient currently requires minA/ CGA with mobility secondary to muscle weakness, decreased cardiorespiratoy endurance, and decreased standing balance and decreased balance strategies.  Prior to hospitalization, patient was independent  with mobility and lived with Spouse in a single level House.  Home access is 5Stairs to enter.  Patient will benefit from skilled PT intervention to maximize safe functional mobility, minimize fall risk, and decrease caregiver burden for planned discharge home with 24 hour supervision.  Anticipate patient will benefit from follow up HH at discharge.  PT - End of Session Activity Tolerance: Tolerates 30+ min activity with multiple rests Endurance Deficit: Yes PT Assessment Rehab Potential (ACUTE/IP ONLY): Excellent PT Barriers to Discharge: Decreased caregiver support;Lack of/limited family support;Insurance for SNF coverage PT Patient demonstrates impairments in the following area(s): Balance;Pain;Endurance;Safety PT Transfers Functional Problem(s): Bed Mobility;Bed to Chair;Car;Furniture PT Locomotion Functional Problem(s): Ambulation;Stairs PT Plan PT Intensity: Minimum of 1-2 x/day ,45 to 90 minutes PT Frequency: 5 out of 7 days PT Duration Estimated Length of Stay: 5-7 days PT Treatment/Interventions: Ambulation/gait training;Cognitive remediation/compensation;Discharge planning;DME/adaptive equipment instruction;Functional mobility  training;Pain management;Psychosocial support;Therapeutic Activities;UE/LE Strength taining/ROM;Balance/vestibular training;Community reintegration;Neuromuscular re-education;Patient/family education;Skin care/wound management;Stair training;Therapeutic Exercise;UE/LE Coordination activities PT Transfers Anticipated Outcome(s): ModI/ supervision PT Locomotion Anticipated Outcome(s): Supervision  PT Recommendation Recommendations for Other Services: Therapeutic Recreation consult Therapeutic Recreation Interventions: Warehouse Manager;Outing/community reintergration Follow Up Recommendations: Home health PT;Outpatient PT;24 hour supervision/assistance Patient destination: Home Equipment Recommended: To be determined   PT Evaluation Precautions/Restrictions Precautions Precautions: Fall Precaution/Restrictions Comments: SBP <150, stitches out 11/26 Restrictions Weight Bearing Restrictions Per Provider Order: No General   Vital SignsTherapy Vitals Pulse Rate: 71 BP: (!) 144/55 Patient Position (if appropriate): Lying Oxygen Therapy SpO2: 98 % O2 Device: Room Air Pain Pain Assessment Pain Scale: 0-10 Pain Score: 5  Pain Location: Neck (mid) Pain Intervention(s): Medication (See eMAR);Rest;Repositioned Pain Interference Pain Interference Pain Effect on Sleep: 1. Rarely or not at all Pain Interference with Therapy Activities: 1. Rarely or not at all Pain Interference with Day-to-Day Activities: 1. 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 [employer] Leisure: Hobbies-yes (Comment) (reading, cleaning) Vision/Perception  Vision - History Ability to See in Adequate Light: 0 Adequate Perception Perception: Within Functional Limits Praxis Praxis: WFL  Cognition Overall Cognitive Status: Within Functional Limits for tasks assessed Arousal/Alertness: Awake/alert Orientation Level: Oriented X4 Focused Attention: Appears intact Sustained Attention: Appears intact Selective Attention: Appears intact Memory: Appears intact Awareness: Appears intact Problem Solving: Appears intact Safety/Judgment: Appears intact Sensation Sensation Light Touch: Appears Intact Hot/Cold: Appears Intact Proprioception: Appears Intact Coordination Gross Motor Movements are Fluid and Coordinated: Yes Fine Motor Movements are Fluid and Coordinated: Yes Motor  Motor Motor: Within Functional Limits Motor - Skilled Clinical Observations: general deconditioning   Trunk/Postural Assessment  Cervical Assessment Cervical Assessment: Within Functional Limits Thoracic Assessment Thoracic Assessment: Exceptions to [facility] (slightly rounded shoulders) Lumbar Assessment Lumbar Assessment: Within Functional Limits Postural Control Postural Control: Within Functional Limits Righting Reactions: slightly delayed/ decreased with larger challenges to standing balance  Balance Balance Balance Assessed: Yes Static Sitting Balance Static Sitting - Balance Support: No upper extremity supported;Feet supported Static Sitting - Level of Assistance: 6: Modified independent (Device/Increase time) Dynamic Sitting Balance Dynamic Sitting - Balance Support: No upper extremity supported;Feet supported Dynamic Sitting - Level of Assistance: 5: Stand by assistance Static Standing Balance Static  Standing - Balance Support: No upper extremity supported Static Standing - Level of Assistance: Other (comment) (CGA/ MinA) Dynamic Standing Balance Dynamic Standing - Balance Support: During functional activity;No upper extremity supported Dynamic Standing - Level of Assistance: 4: Min assist Extremity Assessment      RLE Assessment RLE Assessment: Within Functional Limits General Strength Comments: grossly 4/5 LLE Assessment LLE Assessment: Within Functional Limits General Strength Comments: grossly 4/5  Care Tool Care Tool Bed Mobility Roll left and right activity   Roll left and right assist level: Supervision/Verbal cueing    Sit to lying activity   Sit to lying assist level: Supervision/Verbal cueing    Lying to sitting on side of bed activity   Lying to sitting on side of bed assist level: the ability to move from lying on the back to sitting on the side of the bed with no back support.: Supervision/Verbal cueing     Care Tool Transfers Sit to  stand transfer   Sit to stand assist level: Contact Guard/Touching assist    Chair/bed transfer   Chair/bed transfer assist level: Minimal Assistance - Patient > 75%    Car transfer   Car transfer assist level: Minimal Assistance - Patient > 75%      Care Tool Locomotion Ambulation   Assist level: Contact Guard/Touching assist Assistive device: Walker-rolling Max distance: 168 ft  Walk 10 feet activity   Assist level: Minimal Assistance - Patient > 75% Assistive device: No Device   Walk 50 feet with 2 turns activity   Assist level: Contact Guard/Touching assist Assistive device: Walker-rolling  Walk 150 feet activity   Assist level: Contact Guard/Touching assist Assistive device: Walker-rolling  Walk 10 feet on uneven surfaces activity Walk 10 feet on uneven surfaces activity did not occur: Safety/medical concerns      Stairs   Assist level: Contact Guard/Touching assist Stairs assistive device: 2 hand rails Max  number of stairs: 4  Walk up/down 1 step activity   Walk up/down 1 step (curb) assist level: Contact Guard/Touching assist Walk up/down 1 step or curb assistive device: 2 hand rails  Walk up/down 4 steps activity   Walk up/down 4 steps assist level: Contact Guard/Touching assist Walk up/down 4 steps assistive device: 2 hand rails  Walk up/down 12 steps activity Walk up/down 12 steps activity did not occur: Safety/medical concerns      Pick up small objects from floor Pick up small object from the floor (from standing position) activity did not occur: Safety/medical concerns      Wheelchair Is the patient using a wheelchair?: Yes Type of Wheelchair: Manual   Wheelchair assist level: Maximal Assistance - Patient 25 - 49% Max wheelchair distance: 250 ft  Wheel 50 feet with 2 turns activity   Assist Level: Maximal Assistance - Patient 25 - 49%  Wheel 150 feet activity   Assist Level: Maximal Assistance - Patient 25 - 49%    Refer to Care Plan for Long Term Goals  SHORT TERM GOAL WEEK 1 PT Short Term Goal 1 (Week 1): STG = LTG d/t ELOS  Recommendations for other services: Adult Nurse group, Stress management, and Outing/community reintegration  Skilled Therapeutic Intervention Mobility Transfers Transfers: Sit to Stand;Stand to [employer] Transfers Sit to Stand: Contact Guard/Touching assist Stand to Sit: Contact Guard/Touching assist Stand Pivot Transfers: Minimal Assistance - Patient > 75% Transfer (Assistive device): 1 person hand held assist Locomotion  Gait Ambulation: Yes Gait Assistance: Contact Guard/Touching assist Gait Distance (Feet): 168 Feet Assistive device: Rolling walker Gait Gait: Yes Gait Pattern: Within Functional Limits;Step-through pattern;Decreased stride length Gait velocity: decreased Stairs / Additional Locomotion Stairs: Yes Stairs Assistance: Contact Guard/Touching assist Stair Management Technique: Two  rails;Alternating pattern;Step to pattern;Forwards Number of Stairs: 4 Height of Stairs: 6 Ramp: Contact Guard/touching assist Wheelchair Mobility Wheelchair Mobility: No  Skilled Intervention: PT Evaluation completed; see above for results. PT educated patient in roles of PT vs OT, PT POC, rehab potential, rehab goals, and discharge recommendations along with recommendation for follow-up rehabilitation services. Individual treatment initiated:  Patient supine in bed upon PT arrival. Patient alert and agreeable to PT session. Is HOH but also does not have hearing aids here at hospital. Husband to bring along with clothes later in day.   No pain complaint during session.  Pt performs functional mobility as described above. In addition, noted that pt has a mild delay in more challenging standing balance challenges but otherwise appears to right  self appropriately. Requires slight increase in need for physical assist  to MinA when ambulating without AD at this time.   She relates that husband will be her main caregiver on return home. He is 85 and has knee pain/ weakness. May require additional family assist on first returning home.   Limited mainly by generalized weakness (although performs [facility]) and reduced activity tolerance. Will address endurance/ strength and balance during IPR stay.   Patient supine in bed  at end of session with brakes locked, bed alarm set, and all needs within reach. Oriented to time and time of next session with OT.    Discharge Criteria: Patient will be discharged from PT if patient refuses treatment 3 consecutive times without medical reason, if treatment goals not met, if there is a change in medical status, if patient makes no progress towards goals or if patient is discharged from hospital.  The above assessment, treatment plan, treatment alternatives and goals were discussed and mutually agreed upon: by patient  Mliss DELENA Milliner PT, DPT, CSRS 04/24/2024, 12:49  AM

## 2024-04-24 NOTE — Progress Notes (Addendum)
 PROGRESS NOTE   Subjective/Complaints: Had a very good day of therapy yesterday. Slept well last night. Felt a little dizzy when she first got of bed but that resolved after she was up for a few minutes. + dysuria  ROS: Patient denies fever, rash, sore throat, blurred vision,   nausea, vomiting, diarrhea, cough, shortness of breath or chest pain, joint or back/neck pain, headache, or mood change.   Objective:   No results found. Recent Labs    04/23/24 0432  WBC 18.0*  HGB 7.9*  HCT 23.9*  PLT 441*   Recent Labs    04/22/24 0539 04/23/24 0432  NA 140 140  K 3.8 3.8  CL 108 105  CO2 24 25  GLUCOSE 129* 133*  BUN 9 12  CREATININE 0.46 0.40*  CALCIUM  8.8* 8.7*    Intake/Output Summary (Last 24 hours) at 04/24/2024 0932 Last data filed at 04/24/2024 0730 Gross per 24 hour  Intake 1156 ml  Output --  Net 1156 ml        Physical Exam: Vital Signs Blood pressure (!) 141/52, pulse 82, temperature 98.8 F (37.1 C), temperature source Oral, resp. rate 18, height 5' 2 (1.575 m), weight 65.1 kg, SpO2 97%.   Constitutional: No distress . Vital signs reviewed. HEENT:   EOMI, oral membranes moist Neck: supple Cardiovascular: RRR without murmur. No JVD    Respiratory/Chest: CTA Bilaterally without wheezes or rales. Normal effort    GI/Abdomen: BS +, non-tender, non-distended Ext: no clubbing, cyanosis, or edema Psych: pleasant and cooperative  Skin: Clean and intact without signs of breakdown, incision remains CDI with staples along left scalp.  Neuro:  Alert and oriented x 3. Normal insight and awareness. Intact Memory for month/year/day of week, person, place, and reason. Normal language and speech. Cranial nerve exam unremarkable. MMT: BUE 4- to 4/5 prox to distal. BLE 3+/4- to 4/5 prox to distal. Sensory exam normal for light touch and pain in all 4 limbs. No limb ataxia or cerebellar signs. No abnormal tone  appreciated.  --stable exam 11/25  Musculoskeletal: Full ROM, No pain with AROM or PROM in the neck, trunk, or extremities. Posture appropriate    Assessment/Plan: 1. Functional deficits which require 3+ hours per day of interdisciplinary therapy in a comprehensive inpatient rehab setting. Physiatrist is providing close team supervision and 24 hour management of active medical problems listed below. Physiatrist and rehab team continue to assess barriers to discharge/monitor patient progress toward functional and medical goals  Care Tool:  Bathing    Body parts bathed by patient: Right arm, Left arm, Chest, Abdomen, Front perineal area, Buttocks, Face, Left upper leg, Right upper leg   Body parts bathed by helper: Right lower leg, Left lower leg     Bathing assist Assist Level: Minimal Assistance - Patient > 75%     Upper Body Dressing/Undressing Upper body dressing   What is the patient wearing?: Button up shirt    Upper body assist Assist Level: Set up assist    Lower Body Dressing/Undressing Lower body dressing      What is the patient wearing?: Underwear/pull up, Pants     Lower body assist Assist for  lower body dressing: Supervision/Verbal cueing     Toileting Toileting    Toileting assist Assist for toileting: Supervision/Verbal cueing     Transfers Chair/bed transfer  Transfers assist     Chair/bed transfer assist level: Minimal Assistance - Patient > 75%     Locomotion Ambulation   Ambulation assist      Assist level: Contact Guard/Touching assist Assistive device: Walker-rolling Max distance: 168 ft   Walk 10 feet activity   Assist     Assist level: Minimal Assistance - Patient > 75% Assistive device: No Device   Walk 50 feet activity   Assist    Assist level: Contact Guard/Touching assist Assistive device: Walker-rolling    Walk 150 feet activity   Assist    Assist level: Contact Guard/Touching assist Assistive device:  Walker-rolling    Walk 10 feet on uneven surface  activity   Assist Walk 10 feet on uneven surfaces activity did not occur: Safety/medical concerns         Wheelchair     Assist Is the patient using a wheelchair?: Yes Type of Wheelchair: Manual    Wheelchair assist level: Maximal Assistance - Patient 25 - 49% Max wheelchair distance: 250 ft    Wheelchair 50 feet with 2 turns activity    Assist        Assist Level: Maximal Assistance - Patient 25 - 49%   Wheelchair 150 feet activity     Assist      Assist Level: Maximal Assistance - Patient 25 - 49%   Blood pressure (!) 141/52, pulse 82, temperature 98.8 F (37.1 C), temperature source Oral, resp. rate 18, height 5' 2 (1.575 m), weight 65.1 kg, SpO2 97%.  Medical Problem List and Plan: 1. Functional deficits secondary to ground level fall with L SDH s/p L frontoparietal craniotomy 04/11/24 by Dr. Lanis:             -patient may  shower with shower cap             -ELOS/Goals: 10-12d sup/mod I===may be shorter than that            -Continue CIR therapies including PT, OT, and SLP    2.  Antithrombotics: -DVT/anticoagulation:  Mechanical: Sequential compression devices, below knee Bilateral lower extremities             -Also Lovenox  30mg  BID             -antiplatelet therapy: none   3. Pain Management: tylenol  1000mg  q6h scheduled. Tramadol  and robaxin  PRN. On chart review, pt may have previously been on Celebrex 200mg  BID-- hold for now.    4. Mood/Behavior/Sleep/hx of anxiety:             -antipsychotic agents: none -Melatonin 3mg  nightly for sleep, previously used Xanax 0.5mg  PRN   -is sleeping well -Continue Celexa  20mg  daily   5. Neuropsych/cognition: This patient is capable of making decisions on her own behalf.   6. Skin/Wound Care: wound care and routine skin care; staples out 11/26 or after   7. Fluids/Electrolytes/Nutrition: monitor I&O and routine labs, continue  vitamins/supplements. Cortrak d/c'd 11/20:             -Diet changed to carb mod -Currently has glucerna shakes TID-- per notes, doesn't do well with Ensure  -po intake is solid   8. HTN/Afib with RVR: previously on Bisoprolol -hydrochlorothiazide  2.5-6.25mg  ?0.5tab daily-- not on formulary and has now been switched. Off Cleviprex  since 11/20. Off Amio, back in  NSR -Continue Amlodipine  10mg  daily, Carvedilol  25mg  BID, Hydralazine  50mg  TID, Losartan  100mg  daily -11/25 remains under control -explained to son that this is the regimen she would likely remain on at discharge.     9. HLD: continue Rosuvastatin  10mg  nightly   10. DM2: previously on Metformin XR 750mg  daily, currently held, resume as appropriate. On SSI Moderate scale. Monitor CBGs.    CBG (last 3)  Recent Labs    04/23/24 1628 04/23/24 2055 04/24/24 0628  GLUCAP 160* 113* 160*    11/25 controlled 11. Constipation: continue Colace 100mg  BID and psyllium packets BID, Miralax  PRN, also has Sorbitol /Fleet's PRN.  -LBM 11/23 type 3 so monitor closely.   -stay with same meds for now 12. Vertigo: apparently has hx of this, may have caused the fall             11/25-PT vestibular eval pending--will discuss with PT in conference today             -Meclizine  12.5mg  TID PRN   13. AHRF/VDRF/pulm edema: intubated 11/10-11 then 11/16-17, treated with Unasyn  but d/c'd 11/19 after resp cx neg and hypoxia improved.             -Incentive Spiro   14. Urinary retention: foley 11/14-20, but voiding spontaneously since; chart review shows ?Flomax previously--  -voiding continently at present   15. ABLA Hgb 7-8, monitor closely, transfuse <7   11/24- 7.9, continue to follow, no gross blood loss  16. Leukocytosis: 18k, has been trending up  -afebrile, urine clear/continent but pt reports dysuria  11/25-recheck CBC today and collect UA/UCX  *addendum: UA positive, began empiric keflex  500mg  bid  LOS: 2 days A FACE TO FACE EVALUATION WAS  PERFORMED  Jeanne Ellison 04/24/2024, 9:32 AM

## 2024-04-24 NOTE — Progress Notes (Signed)
 Speech Language Pathology Daily Session Note  Patient Details  Name: Jeanne Ellison MRN: [id number] Date of Birth: 02/07/1941  Today's Date: 04/24/2024 SLP Individual Time: 0800-0900 SLP Individual Time Calculation (min): 60 min  Short Term Goals: Week 1: SLP Short Term Goal 1 (Week 1): STGs = LTGs d/t ELOS  Skilled Therapeutic Interventions:   Pt and her son greeted at bedside. She was awake and alert upon SLP arrival and very cooperative/participative throughout tx tasks targeting complex communication. During initial conversation, she was able to recall info re this SLP and demonstrated adequate thought formulation. Pt's doctor also arrived for morning rounds and she required only supervisionA for very specific word finding re medications and medical hx. She was assisted to the bathroom (CGA via RW) and was independent w/ problem solving and sequencing. She was continent of bowel and bladder. She then completed a mildly complex reading comprehension task (short paragraph level). Independently, she was 80% accurate. Success improved to 100% accuracy w/ supervisionA. At the end of tx tasks, she was left in bed w/ the alarm set and call light within reach. Recommend cont ST per POC.   Pain Pain Assessment Pain Scale: 0-10 Pain Score: 0-No pain Pain Location: Other (Comment) Pain Intervention(s): Medication (See eMAR)  Therapy/Group: Individual Therapy  Jeanne Ellison 04/24/2024, 8:40 AM

## 2024-04-25 DIAGNOSIS — S069XAD Unspecified intracranial injury with loss of consciousness status unknown, subsequent encounter: Secondary | ICD-10-CM | POA: Diagnosis not present

## 2024-04-25 DIAGNOSIS — D62 Acute posthemorrhagic anemia: Secondary | ICD-10-CM | POA: Diagnosis not present

## 2024-04-25 LAB — GLUCOSE, CAPILLARY
Glucose-Capillary: 134 mg/dL — ABNORMAL HIGH (ref 70–99)
Glucose-Capillary: 150 mg/dL — ABNORMAL HIGH (ref 70–99)
Glucose-Capillary: 112 mg/dL — ABNORMAL HIGH (ref 70–99)
Glucose-Capillary: 152 mg/dL — ABNORMAL HIGH (ref 70–99)

## 2024-04-25 MED ADMIN — Hydralazine HCl Tab 50 MG: 50 mg | ORAL | NDC 23155083401

## 2024-04-25 MED ADMIN — Nutritional Supplement Liquid: 237 mL | ORAL | NDC 7007464928

## 2024-04-25 MED ADMIN — Rosuvastatin Calcium Tab 5 MG: 10 mg | ORAL | NDC 65862029390

## 2024-04-25 MED ADMIN — Psyllium Powder Packet 95%: 1 | ORAL | NDC 3700074087

## 2024-04-25 MED ADMIN — Cholecalciferol Tab 25 MCG (1000 Unit): 1000 [IU] | ORAL | NDC 2055503300

## 2024-04-25 MED ADMIN — Insulin Aspart Inj Soln 100 Unit/ML: 3 [IU] | SUBCUTANEOUS | NDC 73070010011

## 2024-04-25 MED ADMIN — Insulin Aspart Inj Soln 100 Unit/ML: 2 [IU] | SUBCUTANEOUS | NDC 73070010011

## 2024-04-25 MED ADMIN — Acetaminophen Tab 500 MG: 1000 mg | ORAL | NDC 50580045711

## 2024-04-25 MED ADMIN — THERA M PLUS PO TABS: 1 | ORAL | NDC 1650058694

## 2024-04-25 MED ADMIN — Thiamine Mononitrate Tab 100 MG: 100 mg | ORAL | NDC 54629005701

## 2024-04-25 MED ADMIN — Ascorbic Acid Tab 500 MG: 500 mg | ORAL | NDC 00904052361

## 2024-04-25 MED ADMIN — Carvedilol Tab 25 MG: 25 mg | ORAL | NDC 72888003701

## 2024-04-25 MED ADMIN — Losartan Potassium Tab 50 MG: 100 mg | ORAL | NDC 68180037709

## 2024-04-25 MED ADMIN — Enoxaparin Sodium Inj Soln Pref Syr 30 MG/0.3ML: 30 mg | SUBCUTANEOUS | NDC 71288043280

## 2024-04-25 MED ADMIN — Amlodipine Besylate Tab 10 MG (Base Equivalent): 10 mg | ORAL | NDC 68382012316

## 2024-04-25 MED ADMIN — Melatonin Tab 3 MG: 3 mg | ORAL | NDC 3160402741

## 2024-04-25 MED ADMIN — Citalopram Hydrobromide Tab 20 MG (Base Equiv): 20 mg | ORAL | NDC 00904608561

## 2024-04-25 NOTE — Plan of Care (Signed)
  Problem: Consults Goal: RH BRAIN INJURY PATIENT EDUCATION Description: Description: See Patient Education module for eduction specifics Outcome: Progressing   Problem: RH BOWEL ELIMINATION Goal: RH STG MANAGE BOWEL WITH ASSISTANCE Description: STG Manage Bowel with supervision Assistance. Outcome: Progressing   Problem: RH BLADDER ELIMINATION Goal: RH STG MANAGE BLADDER WITH ASSISTANCE Description: STG Manage Bladder With supervision Assistance Outcome: Progressing   Problem: RH SKIN INTEGRITY Goal: RH STG SKIN FREE OF INFECTION/BREAKDOWN Description: Manage skin free from infection with supervision assistance Outcome: Progressing   Problem: RH SAFETY Goal: RH STG ADHERE TO SAFETY PRECAUTIONS W/ASSISTANCE/DEVICE Description: STG Adhere to Safety Precautions With supervision Assistance/Device. Outcome: Progressing   Problem: RH PAIN MANAGEMENT Goal: RH STG PAIN MANAGED AT OR BELOW PT'S PAIN GOAL Description: <4 w/ prns Outcome: Progressing   Problem: RH KNOWLEDGE DEFICIT BRAIN INJURY Goal: RH STG INCREASE KNOWLEDGE OF SELF CARE AFTER BRAIN INJURY Description: Manage knowledge of self care after brain injury with supervision assistance using educational materials provided Outcome: Progressing

## 2024-04-25 NOTE — Progress Notes (Addendum)
 Patient ID: Jeanne Ellison, female   DOB: 1940-08-25, 83 y.o.   MRN: [id number]  SW faxed outpatient Pt/OT/Slp referral to [facility] ([phone]/[phone]). *SW received call from Sarah reporting referral received.   Graeme Jude, MSW, LCSW Office: [phone] Cell: [phone] Fax: [phone]

## 2024-04-25 NOTE — Progress Notes (Signed)
 Physical Therapy Session Note  Patient Details  Name: Jeanne Ellison MRN: [id number] Date of Birth: 10/06/1940  Today's Date: 04/25/2024 PT Individual Time: 1130-1158 PT Individual Time Calculation (min): 28 min   Short Term Goals: Week 1:  PT Short Term Goal 1 (Week 1): STG = LTG d/t ELOS  Skilled Therapeutic Interventions/Progress Updates:       Pt presents in bed with her husband at the bedside. Pt reports a headache, which was managed with pain medication from the RN. Pt needing encouragement to participate in therapy session, reports I'm lazy. Ultimately, patient amenable.   Bed mobility completed without assist. Donned shoes at EOB for time management with PT assisting. Sit<>stand to RW with supervision assist. Fair sitting balance with RW support while standing. Ambulates with CGA, progressing to supervision, using RW from her room to day room gym, ~165'.   Nustep completed at L3 resistance using BUE/BLE for x14 minutes - pt keeping cadence > 50spm and achieving > 500steps total.   Pt returned to her room in similar assist level as above, pt needing directional cues for locating room #. Ended session seated EOB with alarm on, husband at bedside.     Therapy Documentation Precautions:  Precautions Precautions: Fall Precaution/Restrictions Comments: SBP <150, stitches out 11/26 Restrictions Weight Bearing Restrictions Per Provider Order: No General:     Therapy/Group: Individual Therapy  Sherlean SHAUNNA Perks 04/25/2024, 7:49 AM

## 2024-04-25 NOTE — Progress Notes (Signed)
 27 Staples removed from right side of head.

## 2024-04-25 NOTE — Progress Notes (Signed)
 PROGRESS NOTE   Subjective/Complaints: No issues overnite   ROS: Patient denies CP, SOB, N/V/D Objective:   No results found. Recent Labs    04/23/24 0432 04/24/24 1002  WBC 18.0* 11.9*  HGB 7.9* 8.0*  HCT 23.9* 24.9*  PLT 441* 488*   Recent Labs    04/23/24 0432  NA 140  K 3.8  CL 105  CO2 25  GLUCOSE 133*  BUN 12  CREATININE 0.40*  CALCIUM  8.7*    Intake/Output Summary (Last 24 hours) at 04/25/2024 0945 Last data filed at 04/25/2024 0648 Gross per 24 hour  Intake 540 ml  Output 400 ml  Net 140 ml        Physical Exam: Vital Signs Blood pressure (!) 152/56, pulse 79, temperature 98.5 F (36.9 C), temperature source Oral, resp. rate 18, height 5' 2 (1.575 m), weight 65.1 kg, SpO2 95%.   General: No acute distress Mood and affect are appropriate Heart: Regular rate and rhythm no rubs murmurs or extra sounds Lungs: Clear to auscultation, breathing unlabored, no rales or wheezes Abdomen: Positive bowel sounds, soft nontender to palpation, nondistended Extremities: No clubbing, cyanosis, or edema Skin: No evidence of breakdown, no evidence of rash  Neuro:  Alert and oriented x 3. Normal insight and awareness. Normal language and speech. Cranial nerve exam unremarkable. MMT: BUE 4- to 4/5 prox to distal. BLE 3+/4- to 4/5 prox to distal. Sensory exam normal for light touch and pain in all 4 limbs. No limb ataxia or cerebellar signs. No abnormal tone appreciated.  --stable exam 11/25  Musculoskeletal: Full ROM, No pain with AROM or PROM in the neck, trunk, or extremities. Posture appropriate    Assessment/Plan: 1. Functional deficits which require 3+ hours per day of interdisciplinary therapy in a comprehensive inpatient rehab setting. Physiatrist is providing close team supervision and 24 hour management of active medical problems listed below. Physiatrist and rehab team continue to assess barriers to  discharge/monitor patient progress toward functional and medical goals  Care Tool:  Bathing    Body parts bathed by patient: Right arm, Left arm, Chest, Abdomen, Front perineal area, Buttocks, Face, Left upper leg, Right upper leg   Body parts bathed by helper: Right lower leg, Left lower leg     Bathing assist Assist Level: Minimal Assistance - Patient > 75%     Upper Body Dressing/Undressing Upper body dressing   What is the patient wearing?: Button up shirt    Upper body assist Assist Level: Set up assist    Lower Body Dressing/Undressing Lower body dressing      What is the patient wearing?: Underwear/pull up, Pants     Lower body assist Assist for lower body dressing: Supervision/Verbal cueing     Toileting Toileting    Toileting assist Assist for toileting: Supervision/Verbal cueing     Transfers Chair/bed transfer  Transfers assist     Chair/bed transfer assist level: Minimal Assistance - Patient > 75%     Locomotion Ambulation   Ambulation assist      Assist level: Contact Guard/Touching assist Assistive device: Walker-rolling Max distance: 168 ft   Walk 10 feet activity   Assist  Assist level: Minimal Assistance - Patient > 75% Assistive device: No Device   Walk 50 feet activity   Assist    Assist level: Contact Guard/Touching assist Assistive device: Walker-rolling    Walk 150 feet activity   Assist    Assist level: Contact Guard/Touching assist Assistive device: Walker-rolling    Walk 10 feet on uneven surface  activity   Assist Walk 10 feet on uneven surfaces activity did not occur: Safety/medical concerns         Wheelchair     Assist Is the patient using a wheelchair?: Yes Type of Wheelchair: Manual    Wheelchair assist level: Maximal Assistance - Patient 25 - 49% Max wheelchair distance: 250 ft    Wheelchair 50 feet with 2 turns activity    Assist        Assist Level: Maximal  Assistance - Patient 25 - 49%   Wheelchair 150 feet activity     Assist      Assist Level: Maximal Assistance - Patient 25 - 49%   Blood pressure (!) 152/56, pulse 79, temperature 98.5 F (36.9 C), temperature source Oral, resp. rate 18, height 5' 2 (1.575 m), weight 65.1 kg, SpO2 95%.  Medical Problem List and Plan: 1. Functional deficits secondary to ground level fall with L SDH s/p L frontoparietal craniotomy 04/11/24 by Dr. Lanis:             -patient may  shower with shower cap             -ELOS/Goals: 10-12d sup/mod may be shorter than that            -Continue CIR therapies including PT, OT, and SLP    2.  Antithrombotics: -DVT/anticoagulation:  Mechanical: Sequential compression devices, below knee Bilateral lower extremities             -Also Lovenox  30mg  BID             -antiplatelet therapy: none   3. Pain Management: tylenol  1000mg  q6h scheduled. Tramadol  and robaxin  PRN. On chart review, pt may have previously been on Celebrex 200mg  BID-- hold for now.    4. Mood/Behavior/Sleep/hx of anxiety:             -antipsychotic agents: none -Melatonin 3mg  nightly for sleep, previously used Xanax 0.5mg  PRN   -is sleeping well -Continue Celexa  20mg  daily   5. Neuropsych/cognition: This patient is capable of making decisions on her own behalf.   6. Skin/Wound Care: wound care and routine skin care; staples out 11/26 or after   7. Fluids/Electrolytes/Nutrition: monitor I&O and routine labs, continue vitamins/supplements. Cortrak d/c'd 11/20:             -Diet changed to carb mod -Currently has glucerna shakes TID-- per notes, doesn't do well with Ensure  -po intake is solid   8. HTN/Afib with RVR: previously on Bisoprolol -hydrochlorothiazide  2.5-6.25mg  ?0.5tab daily-- not on formulary and has now been switched. Off Cleviprex  since 11/20. Off Amio, back in NSR -Continue Amlodipine  10mg  daily, Carvedilol  25mg  BID, Hydralazine  50mg  TID, Losartan  100mg  daily -11/25  remains under control -explained to son that this is the regimen she would likely remain on at discharge.     9. HLD: continue Rosuvastatin  10mg  nightly   10. DM2: previously on Metformin XR 750mg  daily, currently held, resume as appropriate. On SSI Moderate scale. Monitor CBGs.    CBG (last 3)  Recent Labs    04/24/24 1639 04/24/24 2021 04/25/24 0616  GLUCAP 93  145* 134*    11/26 controlled 11. Constipation: continue Colace 100mg  BID and psyllium packets BID, Miralax  PRN, also has Sorbitol /Fleet's PRN.  -LBM 11/23 type 3 so monitor closely.   -stay with same meds for now 12. Vertigo: apparently has hx of this, may have caused the fall             11/25-PT vestibular eval pending--will discuss with PT in conference today             -Meclizine  12.5mg  TID PRN   13. AHRF/VDRF/pulm edema: intubated 11/10-11 then 11/16-17, treated with Unasyn  but d/c'd 11/19 after resp cx neg and hypoxia improved.             -Incentive Spiro   14. Urinary retention: foley 11/14-20, but voiding spontaneously since; chart review shows ?Flomax previously--  -voiding continently at present  UCX pnd 11/26 15. ABLA Hgb 7-8, monitor closely, transfuse <7   11/24- 7.9, continue to follow, no gross blood loss  16. Leukocytosis: 18k, has been trending up  -afebrile, urine clear/continent but pt reports dysuria  11/25-recheck CBC today and collect UA/UCX  *addendum: UA positive, began empiric keflex  500mg  bid    Latest Ref Rng & Units 04/24/2024   10:02 AM 04/23/2024    4:32 AM 04/18/2024    4:08 AM  CBC  WBC 4.0 - 10.5 K/uL 11.9  18.0  12.9   Hemoglobin 12.0 - 15.0 g/dL 8.0  7.9  8.6   Hematocrit 36.0 - 46.0 % 24.9  23.9  25.8   Platelets 150 - 400 K/uL 488  441  336    Improving 11/25 LOS: 3 days A FACE TO FACE EVALUATION WAS PERFORMED  Prentice BRAVO Bernhard Koskinen 04/25/2024, 9:45 AM

## 2024-04-25 NOTE — Progress Notes (Signed)
 Speech Language Pathology Daily Session Note  Patient Details  Name: Jeanne Ellison MRN: [id number] Date of Birth: December 23, 1940  Today's Date: 04/25/2024 SLP Individual Time: 0800-0900 SLP Individual Time Calculation (min): 60 min  Short Term Goals: Week 1: SLP Short Term Goal 1 (Week 1): STGs = LTGs d/t ELOS  Skilled Therapeutic Interventions: Skilled therapy session focused on communication goals. SLP facilitated session by prompting patient to share biographical information re: self, family and medical hx. Patient shared information with no overt word finding difficulties. SLP targeted word finding through synonym and antonym task. Patient required supervision A to name antonyms, however modA to name synonyms. SLP branched down and provided patient with three options to match words that mean the same. Patient then required minA to complete. SLP continued to target expressive language through phrase completion task in which patient required supervisionA. In the remaining minutes of the session, SLP educated patient on describe strategy and encouraged use through simple word description. Patient completed with minA. Patient left in bed with alarm set and call bell in reach. Continue POC  Pain denies  Therapy/Group: Individual Therapy  Zyriah Mask M.A., CCC-SLP 04/25/2024, 7:36 AM

## 2024-04-25 NOTE — Progress Notes (Signed)
 Met with patient, daughter, husband to review current situation, team conference and plan of care. Reviewed medications, incision care, removal of staples when MD orders. Rev iewed pain, sleep, bowel, bladder. Reviewed co morbidities  HTN/DM/AF. Continue to follow along to provide educational needs to facilitate preparation for discharge.

## 2024-04-25 NOTE — Progress Notes (Signed)
 Physical Therapy Session Note  Patient Details  Name: Jeanne Ellison MRN: [id number] Date of Birth: 1940/06/28  Today's Date: 04/25/2024 PT Individual Time: 1330-1400 PT Individual Time Calculation (min): 30 min   Short Term Goals: Week 1:  PT Short Term Goal 1 (Week 1): STG = LTG d/t ELOS  Skilled Therapeutic Interventions/Progress Updates: Pt presented in bed with husband present agreeable to therapy. Pt denies pain at start of session. Session focused on therex for general conditioning and LE strengthening. BP monitored during session with pt indicating mild OH symptoms during positional changes which per pt quickly resolved. Pt completed bed moblity with supervision and use of bed fatures and sit to stands with CGA with verbal cues for hand placement. At end of session pt left resting in bed with bed alarm on, call bell within reach and needs met.   Activities performed Ankle pumps Manually resisted leg press Supine hip abd/add Seated LAQ Seated marches Sit to stand x5  Supine 121/41 (65) HR 75 Sitting EOB 126/69 (84) HR 89 Standing  0 min 114/41 (61) HR 91 - symptomatic  Seated after sit to stands 122/55 (73) HR 89      Therapy Documentation Precautions:  Precautions Precautions: Fall Precaution/Restrictions Comments: SBP <150, stitches out 11/26 Restrictions Weight Bearing Restrictions Per Provider Order: No General:   Vital Signs: Therapy Vitals Temp: 98.3 F (36.8 C) Temp Source: Oral Pulse Rate: 85 Resp: 18 BP: (!) 159/54 Patient Position (if appropriate): Lying Oxygen Therapy SpO2: 96 % O2 Device: Room Air   Therapy/Group: Individual Therapy  Naseer Hearn 04/25/2024, 4:06 PM

## 2024-04-25 NOTE — Progress Notes (Signed)
 Occupational Therapy Session Note  Patient Details  Name: [name] MRN: [id number] Date of Birth: 02-03-1941  Today's Date: 04/25/2024 OT Individual Time: 0950-1100 OT Individual Time Calculation (min): 70 min    Short Term Goals: Week 1:  OT Short Term Goal 1 (Week 1): STGs = LTGs  Skilled Therapeutic Interventions/Progress Updates:    Pt received supine with no c/o pain, agreeable to OT session. Functioanl mobility into the shower with CGA using the RW. She first completed toileting tasks at (S) overall. She completed transfer into walk in shower with CGA. She did repeat herself/contradict previous statements multiple times during session. She completed bathing sit > stand from TTB with cueing for use of figure 4 technique for feet but (S) overall with grab bar use. She completed UB dressing from EOB with set up assist, LB dressing with (S). OT donned knee high ted hose with max A for edema management.  She completed grooming tasks at the sink in standing with (S). Pt was taken via w/c to the therapy gym for time management. Pt completed standing level reciprocal tapping activity with no UE support using a 5 in step 3x15 repetitions. Activity performed to challenge dynamic standing balance and functional activity tolerance to simulate household threshold management and reduce fall risk. Pt required min A with no UE. Pt required use of rest breaks throughout session for recovery, as well as to support safety and prevent overexertion. During breaks, OT monitored recovery time to assess endurance and response to exertion. She returned to her room and transferred back to supine with all needs met, bed alarm set. Husband roy present.   Therapy Documentation Precautions:  Precautions Precautions: Fall Precaution/Restrictions Comments: SBP <150, stitches out 11/26 Restrictions Weight Bearing Restrictions Per Provider Order: No Therapy/Group: Individual Therapy  Jeanne Ellison 04/25/2024, 8:40 AM

## 2024-04-26 DIAGNOSIS — S069XAD Unspecified intracranial injury with loss of consciousness status unknown, subsequent encounter: Secondary | ICD-10-CM | POA: Diagnosis not present

## 2024-04-26 LAB — CBC WITH DIFFERENTIAL/PLATELET
Abs Immature Granulocytes: 0.14 K/uL — ABNORMAL HIGH (ref 0.00–0.07)
Basophils Absolute: 0.1 K/uL (ref 0.0–0.1)
Basophils Relative: 1 %
Eosinophils Absolute: 0.3 K/uL (ref 0.0–0.5)
Eosinophils Relative: 3 %
HCT: 26.2 % — ABNORMAL LOW (ref 36.0–46.0)
Hemoglobin: 8.7 g/dL — ABNORMAL LOW (ref 12.0–15.0)
Immature Granulocytes: 2 %
Lymphocytes Relative: 20 %
Lymphs Abs: 1.8 K/uL (ref 0.7–4.0)
MCH: 29.4 pg (ref 26.0–34.0)
MCHC: 33.2 g/dL (ref 30.0–36.0)
MCV: 88.5 fL (ref 80.0–100.0)
Monocytes Absolute: 1.1 K/uL — ABNORMAL HIGH (ref 0.1–1.0)
Monocytes Relative: 12 %
Neutro Abs: 5.8 K/uL (ref 1.7–7.7)
Neutrophils Relative %: 62 %
Platelets: 541 K/uL — ABNORMAL HIGH (ref 150–400)
RBC: 2.96 MIL/uL — ABNORMAL LOW (ref 3.87–5.11)
RDW: 17.9 % — ABNORMAL HIGH (ref 11.5–15.5)
WBC: 9.1 K/uL (ref 4.0–10.5)
nRBC: 0 % (ref 0.0–0.2)

## 2024-04-26 LAB — GLUCOSE, CAPILLARY
Glucose-Capillary: 160 mg/dL — ABNORMAL HIGH (ref 70–99)
Glucose-Capillary: 164 mg/dL — ABNORMAL HIGH (ref 70–99)
Glucose-Capillary: 136 mg/dL — ABNORMAL HIGH (ref 70–99)
Glucose-Capillary: 162 mg/dL — ABNORMAL HIGH (ref 70–99)

## 2024-04-26 LAB — BASIC METABOLIC PANEL WITH GFR
Anion gap: 7 (ref 5–15)
BUN: 17 mg/dL (ref 8–23)
CO2: 25 mmol/L (ref 22–32)
Calcium: 8.8 mg/dL — ABNORMAL LOW (ref 8.9–10.3)
Chloride: 107 mmol/L (ref 98–111)
Creatinine, Ser: 0.5 mg/dL (ref 0.44–1.00)
GFR, Estimated: >60 mL/min (ref >60)
Glucose, Bld: 160 mg/dL — ABNORMAL HIGH (ref 70–99)
Potassium: 3.8 mmol/L (ref 3.5–5.1)
Sodium: 139 mmol/L (ref 135–145)

## 2024-04-26 MED ADMIN — Hydralazine HCl Tab 50 MG: 50 mg | ORAL | NDC 23155083401

## 2024-04-26 MED ADMIN — Nutritional Supplement Liquid: 237 mL | ORAL | NDC 7007464928

## 2024-04-26 MED ADMIN — Rosuvastatin Calcium Tab 5 MG: 10 mg | ORAL | NDC 65862029390

## 2024-04-26 MED ADMIN — Psyllium Powder Packet 95%: 1 | ORAL | NDC 3700074087

## 2024-04-26 MED ADMIN — Cholecalciferol Tab 25 MCG (1000 Unit): 1000 [IU] | ORAL | NDC 2055503300

## 2024-04-26 MED ADMIN — Insulin Aspart Inj Soln 100 Unit/ML: 3 [IU] | SUBCUTANEOUS | NDC 73070010011

## 2024-04-26 MED ADMIN — Insulin Aspart Inj Soln 100 Unit/ML: 2 [IU] | SUBCUTANEOUS | NDC 73070010011

## 2024-04-26 MED ADMIN — Acetaminophen Tab 500 MG: 1000 mg | ORAL | NDC 50580045711

## 2024-04-26 MED ADMIN — Acetaminophen Tab 500 MG: 1000 mg | ORAL | NDC 00904672060

## 2024-04-26 MED ADMIN — THERA M PLUS PO TABS: 1 | ORAL | NDC 1650058694

## 2024-04-26 MED ADMIN — Thiamine Mononitrate Tab 100 MG: 100 mg | ORAL | NDC 54629005701

## 2024-04-26 MED ADMIN — Ascorbic Acid Tab 500 MG: 500 mg | ORAL | NDC 00904052361

## 2024-04-26 MED ADMIN — Carvedilol Tab 25 MG: 25 mg | ORAL | NDC 72888003701

## 2024-04-26 MED ADMIN — Losartan Potassium Tab 50 MG: 100 mg | ORAL | NDC 68180037709

## 2024-04-26 MED ADMIN — Enoxaparin Sodium Inj Soln Pref Syr 30 MG/0.3ML: 30 mg | SUBCUTANEOUS | NDC 71288043280

## 2024-04-26 MED ADMIN — Amlodipine Besylate Tab 10 MG (Base Equivalent): 10 mg | ORAL | NDC 00904637161

## 2024-04-26 MED ADMIN — Melatonin Tab 3 MG: 3 mg | ORAL | NDC 3160402741

## 2024-04-26 MED ADMIN — Citalopram Hydrobromide Tab 20 MG (Base Equiv): 20 mg | ORAL | NDC 00904608561

## 2024-04-26 MED ADMIN — Tramadol HCl Tab 50 MG: 50 mg | ORAL | NDC 60687079511

## 2024-04-26 NOTE — Progress Notes (Signed)
 PROGRESS NOTE   Subjective/Complaints: Feels well this morning Staples removed from head Has mild headaches, relieved with tylenol  Has visitor at bedside  ROS: Patient denies CP, SOB, N/V/D, +headache improved with tylenol  Objective:   No results found. Recent Labs    04/24/24 1002 04/26/24 0446  WBC 11.9* 9.1  HGB 8.0* 8.7*  HCT 24.9* 26.2*  PLT 488* 541*   Recent Labs    04/26/24 0446  NA 139  K 3.8  CL 107  CO2 25  GLUCOSE 160*  BUN 17  CREATININE 0.50  CALCIUM  8.8*    Intake/Output Summary (Last 24 hours) at 04/26/2024 1130 Last data filed at 04/26/2024 0826 Gross per 24 hour  Intake 580 ml  Output --  Net 580 ml        Physical Exam: Vital Signs Blood pressure (!) 179/60, pulse 94, temperature 98.3 F (36.8 C), temperature source Oral, resp. rate 18, height 5' 2 (1.575 m), weight 65.1 kg, SpO2 94%.   General: No acute distress Mood and affect are appropriate Heart: Regular rate and rhythm no rubs murmurs or extra sounds Lungs: Clear to auscultation, breathing unlabored, no rales or wheezes Abdomen: Positive bowel sounds, soft nontender to palpation, nondistended Extremities: No clubbing, cyanosis, or edema Skin: No evidence of breakdown, no evidence of rash  Neuro:  Alert and oriented x 3. Normal insight and awareness. Normal language and speech. Cranial nerve exam unremarkable. MMT: BUE 4- to 4/5 prox to distal. BLE 3+/4- to 4/5 prox to distal. Sensory exam normal for light touch and pain in all 4 limbs. No limb ataxia or cerebellar signs. No abnormal tone appreciated.  Stable 11/27 Musculoskeletal: Full ROM, No pain with AROM or PROM in the neck, trunk, or extremities. Posture appropriate    Assessment/Plan: 1. Functional deficits which require 3+ hours per day of interdisciplinary therapy in a comprehensive inpatient rehab setting. Physiatrist is providing close team supervision and 24  hour management of active medical problems listed below. Physiatrist and rehab team continue to assess barriers to discharge/monitor patient progress toward functional and medical goals  Care Tool:  Bathing    Body parts bathed by patient: Right arm, Left arm, Chest, Abdomen, Front perineal area, Buttocks, Face, Left upper leg, Right upper leg   Body parts bathed by helper: Right lower leg, Left lower leg     Bathing assist Assist Level: Minimal Assistance - Patient > 75%     Upper Body Dressing/Undressing Upper body dressing   What is the patient wearing?: Button up shirt    Upper body assist Assist Level: Set up assist    Lower Body Dressing/Undressing Lower body dressing      What is the patient wearing?: Underwear/pull up, Pants     Lower body assist Assist for lower body dressing: Supervision/Verbal cueing     Toileting Toileting    Toileting assist Assist for toileting: Supervision/Verbal cueing     Transfers Chair/bed transfer  Transfers assist     Chair/bed transfer assist level: Minimal Assistance - Patient > 75%     Locomotion Ambulation   Ambulation assist      Assist level: Contact Guard/Touching assist Assistive device: Walker-rolling Max  distance: 168 ft   Walk 10 feet activity   Assist     Assist level: Minimal Assistance - Patient > 75% Assistive device: No Device   Walk 50 feet activity   Assist    Assist level: Contact Guard/Touching assist Assistive device: Walker-rolling    Walk 150 feet activity   Assist    Assist level: Contact Guard/Touching assist Assistive device: Walker-rolling    Walk 10 feet on uneven surface  activity   Assist Walk 10 feet on uneven surfaces activity did not occur: Safety/medical concerns         Wheelchair     Assist Is the patient using a wheelchair?: Yes Type of Wheelchair: Manual    Wheelchair assist level: Maximal Assistance - Patient 25 - 49% Max wheelchair  distance: 250 ft    Wheelchair 50 feet with 2 turns activity    Assist        Assist Level: Maximal Assistance - Patient 25 - 49%   Wheelchair 150 feet activity     Assist      Assist Level: Maximal Assistance - Patient 25 - 49%   Blood pressure (!) 179/60, pulse 94, temperature 98.3 F (36.8 C), temperature source Oral, resp. rate 18, height 5' 2 (1.575 m), weight 65.1 kg, SpO2 94%.  Medical Problem List and Plan: 1. Functional deficits secondary to ground level fall with L SDH s/p L frontoparietal craniotomy 04/11/24 by Dr. Lanis:             -patient may  shower with shower cap             -ELOS/Goals: 10-12d sup/mod may be shorter than that            -Continue CIR therapies including PT, OT, and SLP    2.  Antithrombotics: -DVT/anticoagulation:  Mechanical: Sequential compression devices, below knee Bilateral lower extremities             -Also Lovenox  30mg  BID             -antiplatelet therapy: none   3. Pain Management: tylenol  1000mg  q6h scheduled. Tramadol  and robaxin  PRN. On chart review, pt may have previously been on Celebrex 200mg  BID-- hold for now.    4. Mood/Behavior/Sleep/hx of anxiety:             -antipsychotic agents: none -Melatonin 3mg  nightly for sleep, previously used Xanax 0.5mg  PRN   -is sleeping well -Continue Celexa  20mg  daily   5. Neuropsych/cognition: This patient is capable of making decisions on her own behalf.   6. Skin/Wound Care: wound care and routine skin care; staples out 11/26 or after   7. Fluids/Electrolytes/Nutrition: monitor I&O and routine labs, continue vitamins/supplements. Cortrak d/c'd 11/20:             -Diet changed to carb mod -Currently has glucerna shakes TID-- per notes, doesn't do well with Ensure  -po intake is solid   8. HTN/Afib with RVR: previously on Bisoprolol -hydrochlorothiazide  2.5-6.25mg  ?0.5tab daily-- not on formulary and has now been switched. Off Cleviprex  since 11/20. Off Amio, back in  NSR -Continue Amlodipine  10mg  daily, Carvedilol  25mg  BID, Hydralazine  50mg  TID, Losartan  100mg  daily -11/25 remains under control -explained to son that this is the regimen she would likely remain on at discharge.  BP trend reviewed and she has fluctuating hypo and hypertension- continue above regimen    9. HLD: continue Rosuvastatin  10mg  nightly   10. DM2: previously on Metformin XR 750mg  daily, currently held, resume  as appropriate. On SSI Moderate scale. Monitor CBGs.    CBG (last 3)  Recent Labs    04/25/24 1726 04/25/24 2144 04/26/24 0557  GLUCAP 112* 152* 160*    11/26 controlled 11. Constipation: continue Colace 100mg  BID and psyllium packets BID, Miralax  PRN, also has Sorbitol /Fleet's PRN.  -LBM 11/23 type 3 so monitor closely.   -stay with same meds for now  12. Vertigo: apparently has hx of this, may have caused the fall             11/25-PT vestibular eval pending--will discuss with PT in conference today             -continue Meclizine  12.5mg  TID PRN   13. AHRF/VDRF/pulm edema: intubated 11/10-11 then 11/16-17, treated with Unasyn  but d/c'd 11/19 after resp cx neg and hypoxia improved.             -continue Incentive Spiro   14. Urinary retention: foley 11/14-20, but voiding spontaneously since; chart review shows ?Flomax previously--  -voiding continently at present  UCX pnd 11/27  15. ABLA Hgb 7-8, monitor closely, transfuse <7   11/24- 7.9, continue to follow, no gross blood loss  16. Leukocytosis: 18k, has been trending up  -afebrile, urine clear/continent but pt reports dysuria  11/25-recheck CBC today and collect UA/UCX  Continue empiric keflex  500mg  bid    Latest Ref Rng & Units 04/26/2024    4:46 AM 04/24/2024   10:02 AM 04/23/2024    4:32 AM  CBC  WBC 4.0 - 10.5 K/uL 9.1  11.9  18.0   Hemoglobin 12.0 - 15.0 g/dL 8.7  8.0  7.9   Hematocrit 36.0 - 46.0 % 26.2  24.9  23.9   Platelets 150 - 400 K/uL 541  488  441    Improving 11/27 LOS: 4 days A  FACE TO FACE EVALUATION WAS PERFORMED  Remie Mathison P Domanick Cuccia 04/26/2024, 11:30 AM

## 2024-04-27 DIAGNOSIS — I10 Essential (primary) hypertension: Secondary | ICD-10-CM | POA: Diagnosis not present

## 2024-04-27 DIAGNOSIS — E119 Type 2 diabetes mellitus without complications: Secondary | ICD-10-CM | POA: Diagnosis not present

## 2024-04-27 DIAGNOSIS — N390 Urinary tract infection, site not specified: Secondary | ICD-10-CM

## 2024-04-27 DIAGNOSIS — S069XAD Unspecified intracranial injury with loss of consciousness status unknown, subsequent encounter: Secondary | ICD-10-CM | POA: Diagnosis not present

## 2024-04-27 DIAGNOSIS — A499 Bacterial infection, unspecified: Secondary | ICD-10-CM

## 2024-04-27 DIAGNOSIS — D62 Acute posthemorrhagic anemia: Secondary | ICD-10-CM | POA: Diagnosis not present

## 2024-04-27 LAB — URINE CULTURE: Culture: >=100000 — AB

## 2024-04-27 LAB — GLUCOSE, CAPILLARY
Glucose-Capillary: 138 mg/dL — ABNORMAL HIGH (ref 70–99)
Glucose-Capillary: 127 mg/dL — ABNORMAL HIGH (ref 70–99)
Glucose-Capillary: 94 mg/dL (ref 70–99)
Glucose-Capillary: 153 mg/dL — ABNORMAL HIGH (ref 70–99)

## 2024-04-27 MED ORDER — ONDANSETRON 4 MG PO TBDP
4.0000 mg | ORAL_TABLET | Freq: Four times a day (QID) | ORAL | 0 refills | Status: AC | PRN
  Filled 2024-04-27: qty 20, 10d supply, fill #0

## 2024-04-27 MED ORDER — CARVEDILOL 25 MG PO TABS
25.0000 mg | ORAL_TABLET | Freq: Two times a day (BID) | ORAL | 0 refills | Status: DC
  Filled 2024-04-27: qty 30, 15d supply, fill #0

## 2024-04-27 MED ORDER — TRAMADOL HCL 50 MG PO TABS: 50.0000 mg | ORAL_TABLET | Freq: Four times a day (QID) | ORAL | 0 refills | Status: AC | PRN

## 2024-04-27 MED ORDER — LOSARTAN POTASSIUM 100 MG PO TABS
100.0000 mg | ORAL_TABLET | Freq: Every day | ORAL | 0 refills | Status: AC
  Filled 2024-04-27: qty 30, 30d supply, fill #0

## 2024-04-27 MED ORDER — HYDRALAZINE HCL 50 MG PO TABS
50.0000 mg | ORAL_TABLET | Freq: Three times a day (TID) | ORAL | 0 refills | Status: AC
  Filled 2024-04-27: qty 60, 20d supply, fill #0

## 2024-04-27 MED ORDER — MELATONIN 3 MG PO TABS
3.0000 mg | ORAL_TABLET | Freq: Every day | ORAL | 0 refills | Status: AC
  Filled 2024-04-27: qty 30, 30d supply, fill #0

## 2024-04-27 MED ORDER — HYDRALAZINE HCL 50 MG PO TABS
50.0000 mg | ORAL_TABLET | Freq: Three times a day (TID) | ORAL | 0 refills | Status: DC
  Filled 2024-04-27: qty 30, 10d supply, fill #0

## 2024-04-27 MED ORDER — CARVEDILOL 25 MG PO TABS
25.0000 mg | ORAL_TABLET | Freq: Two times a day (BID) | ORAL | 0 refills | Status: AC
  Filled 2024-04-27: qty 60, 30d supply, fill #0

## 2024-04-27 MED ORDER — AMLODIPINE BESYLATE 10 MG PO TABS: 10.0000 mg | ORAL_TABLET | Freq: Every day | ORAL | 0 refills | Status: AC

## 2024-04-27 MED ORDER — ACETAMINOPHEN 500 MG PO TABS: 1000.0000 mg | ORAL_TABLET | Freq: Four times a day (QID) | ORAL | 0 refills | Status: AC

## 2024-04-27 MED ORDER — PSYLLIUM 95 % PO PACK
1.0000 | PACK | Freq: Two times a day (BID) | ORAL | 0 refills | Status: AC
  Filled 2024-04-27: qty 30, 15d supply, fill #0

## 2024-04-27 MED ORDER — MECLIZINE HCL 12.5 MG PO TABS
12.5000 mg | ORAL_TABLET | Freq: Three times a day (TID) | ORAL | 0 refills | Status: AC | PRN
  Filled 2024-04-27: qty 30, 10d supply, fill #0

## 2024-04-27 MED ORDER — POLYETHYLENE GLYCOL 3350 17 G PO PACK: 17.0000 g | PACK | Freq: Every day | ORAL | Status: AC | PRN

## 2024-04-27 MED ORDER — METHOCARBAMOL 500 MG PO TABS
500.0000 mg | ORAL_TABLET | Freq: Four times a day (QID) | ORAL | 0 refills | Status: AC | PRN
  Filled 2024-04-27: qty 28, 7d supply, fill #0

## 2024-04-27 MED ORDER — ROSUVASTATIN CALCIUM 10 MG PO TABS
10.0000 mg | ORAL_TABLET | Freq: Every day | ORAL | 0 refills | Status: AC
  Filled 2024-04-27: qty 30, 30d supply, fill #0

## 2024-04-27 MED ADMIN — Hydralazine HCl Tab 50 MG: 50 mg | ORAL | NDC 23155083401

## 2024-04-27 MED ADMIN — Nutritional Supplement Liquid: 237 mL | ORAL | NDC 7007464921

## 2024-04-27 MED ADMIN — Nutritional Supplement Liquid: 237 mL | ORAL | NDC 7007464928

## 2024-04-27 MED ADMIN — Rosuvastatin Calcium Tab 5 MG: 10 mg | ORAL | NDC 65862029390

## 2024-04-27 MED ADMIN — Cholecalciferol Tab 25 MCG (1000 Unit): 1000 [IU] | ORAL | NDC 2055503300

## 2024-04-27 MED ADMIN — Insulin Aspart Inj Soln 100 Unit/ML: 2 [IU] | SUBCUTANEOUS | NDC 73070010011

## 2024-04-27 MED ADMIN — Insulin Aspart Inj Soln 100 Unit/ML: 3 [IU] | SUBCUTANEOUS | NDC 73070010011

## 2024-04-27 MED ADMIN — Acetaminophen Tab 500 MG: 1000 mg | ORAL | NDC 50580045711

## 2024-04-27 MED ADMIN — THERA M PLUS PO TABS: 1 | ORAL | NDC 1650058694

## 2024-04-27 MED ADMIN — Thiamine Mononitrate Tab 100 MG: 100 mg | ORAL | NDC 54629005701

## 2024-04-27 MED ADMIN — Ascorbic Acid Tab 500 MG: 500 mg | ORAL | NDC 00904052361

## 2024-04-27 MED ADMIN — Carvedilol Tab 25 MG: 25 mg | ORAL | NDC 72888003701

## 2024-04-27 MED ADMIN — Losartan Potassium Tab 50 MG: 100 mg | ORAL | NDC 68180037709

## 2024-04-27 MED ADMIN — Enoxaparin Sodium Inj Soln Pref Syr 30 MG/0.3ML: 30 mg | SUBCUTANEOUS | NDC 71288043280

## 2024-04-27 MED ADMIN — Amlodipine Besylate Tab 10 MG (Base Equivalent): 10 mg | ORAL | NDC 00904637161

## 2024-04-27 MED ADMIN — Melatonin Tab 3 MG: 3 mg | ORAL | NDC 3160402741

## 2024-04-27 MED ADMIN — Citalopram Hydrobromide Tab 20 MG (Base Equiv): 20 mg | ORAL | NDC 00904608561

## 2024-04-27 MED ADMIN — Tramadol HCl Tab 50 MG: 50 mg | ORAL | NDC 60687079511

## 2024-04-27 NOTE — Progress Notes (Signed)
 Occupational Therapy Discharge Summary  Patient Details  Name: Jeanne Ellison MRN: [id number] Date of Birth: 1940-09-19  Date of Discharge from OT service:April 28, 2024   Patient has met 13 of 13 long term goals due to improved activity tolerance, improved balance, postural control, ability to compensate for deficits, improved attention, improved awareness, and improved coordination.  Patient to discharge at overall Modified Independent level.  Patient's care partner is independent to provide the necessary cognitive assistance at discharge.    Recommendation:  Patient will benefit from ongoing skilled OT services in outpatient setting to continue to advance functional skills in the area of BADL and iADL.  Equipment: Family purchasing a TTB  Reasons for discharge: treatment goals met and discharge from hospital  Patient/family agrees with progress made and goals achieved: Yes  OT Discharge Precautions/Restrictions  Precautions Precautions: Fall Restrictions Weight Bearing Restrictions Per Provider Order: No Pain Pain Assessment Pain Scale: 0-10 Pain Score: 0-No pain ADL ADL Eating: Independent Grooming: Independent Where Assessed-Grooming: Standing at sink Upper Body Bathing: Modified independent Where Assessed-Upper Body Bathing: Shower Lower Body Bathing: Modified independent Where Assessed-Lower Body Bathing: Shower Upper Body Dressing: Independent Where Assessed-Upper Body Dressing: Edge of bed Lower Body Dressing: Modified independent Where Assessed-Lower Body Dressing: Edge of bed Toileting: Modified independent Where Assessed-Toileting: Neurosurgeon Method: Surveyor, Minerals: Engineer, Technical Sales: Distant Theme Park Manager Method: Engineer, Technical Sales: Insurance Underwriter: Distant supervision Film/video Editor Method:  Warden/ranger: Sales Promotion Account Executive Baseline Vision/History: 1 Wears glasses Patient Visual Report: No change from baseline Vision Assessment?: No apparent visual deficits Perception  Perception: Within Functional Limits Praxis Praxis: WFL Cognition Cognition Overall Cognitive Status: Within Functional Limits for tasks assessed Arousal/Alertness: Awake/alert Memory: Appears intact Awareness: Appears intact Problem Solving: Appears intact Safety/Judgment: Appears intact Brief Interview for Mental Status (BIMS) Repetition of Three Words (First Attempt): 3 Temporal Orientation: Year: Correct Temporal Orientation: Month: Accurate within 5 days Temporal Orientation: Day: Correct Recall: Sock: Yes, no cue required Recall: Blue: Yes, no cue required Recall: Bed: Yes, after cueing (a piece of furniture) BIMS Summary Score: 14 Sensation Sensation Light Touch: Appears Intact Hot/Cold: Appears Intact Proprioception: Appears Intact Coordination Gross Motor Movements are Fluid and Coordinated: Yes Fine Motor Movements are Fluid and Coordinated: Yes Motor  Motor Motor: Within Functional Limits Mobility  Bed Mobility Bed Mobility: Sit to Supine;Supine to Sit Supine to Sit: Independent Sit to Supine: Independent Transfers Sit to Stand: Independent with assistive device Stand to Sit: Independent with assistive device  Trunk/Postural Assessment  Cervical Assessment Cervical Assessment: Within Functional Limits Thoracic Assessment Thoracic Assessment: Within Functional Limits Lumbar Assessment Lumbar Assessment: Within Functional Limits Postural Control Postural Control: Within Functional Limits  Balance Balance Balance Assessed: Yes Static Sitting Balance Static Sitting - Balance Support: No upper extremity supported;Feet supported Static Sitting - Level of Assistance: 7: Independent Dynamic Sitting Balance Dynamic Sitting - Balance  Support: No upper extremity supported;Feet supported Dynamic Sitting - Level of Assistance: 7: Independent Static Standing Balance Static Standing - Balance Support: Bilateral upper extremity supported Static Standing - Level of Assistance: 6: Modified independent (Device/Increase time) Dynamic Standing Balance Dynamic Standing - Balance Support: Bilateral upper extremity supported Dynamic Standing - Level of Assistance: 6: Modified independent (Device/Increase time) Extremity/Trunk Assessment RUE Assessment RUE Assessment: Within Functional Limits LUE Assessment LUE Assessment: Within Functional Limits   Prepared by: Nena VEAR Moats 04/27/2024, 3:01 PM  Signed by: Nereida Habermann, OTR/L, MSOT 04/28/24

## 2024-04-27 NOTE — Progress Notes (Signed)
 Occupational Therapy Session Note  Patient Details  Name: Jeanne Ellison MRN: [id number] Date of Birth: 02-Sep-1940  Session 1 Today's Date: 04/27/2024 OT Individual Time: 1008-1050 OT Individual Time Calculation (min): 42 min    Session 2 Today's Date: 04/27/2024 OT Individual Time: 8693-8596 OT Individual Time Calculation (min): 57 min    Short Term Goals: Week 1:  OT Short Term Goal 1 (Week 1): STGs = LTGs  Skilled Therapeutic Interventions/Progress Updates:   Session 1  Pt received supine with no c/o pain, agreeable to OT session.  She completed bed mobility with mod I. Ambulatory transfer into the shower with close (S) using RW. She completed toileting tasks x2 during session, both urine and BM void with (S) for thorough hygiene. She completed a full shower at (S) level using TTB. Discussed d/c planning at length, including DME recommendations and safety. She returned to EOB and dressed with (S). She completed grooming tasks standing with (S). Pt was taken via w/c to the therapy gym for time management. She completed 100 ft of functional mobility with CGA using no AD, one LOB that required mod A. Used this as further evidence to use RW at home to ensure compliance. She returned to her room. Pt was left supine with all needs met, bed alarm set and call bell within reach.    Session 2 Family education session completed with pt and her husband and two adult children. Verbal education provided re fall risk reduction, energy conservation strategies, home carryover of transfer training, ADLs, and IADLs. Demonstration and hands on training completed for pt performance of UB/LB bathing and dressing at (S) level, toileting hygiene and transfers, and shower transfers. Provided education and demonstration on DME use recommendations at home. She completed 200 ft total of functional mobility during session with the RW at (S) level. Pt completed TTB transfer in the bathtub with (S) following  demonstration. She completed furniture transfers in the ADL apartment with (S), including bed, recliner, and couch. Pt completed floor transfer, completing the following sequence after a demonstration lowering themselves to the a floor mat, getting into full supine, transitioning into quadruped, and then kneeling, before pivot into sitting EOM- simulating floor to couch or chair at home. OT provided education on fall recovery, when to get up independently vs when to call for EMS after a fall. Pt returned the demonstration with min A. Pt returned to her room via w/c. All family questions answered. Pt was left sitting up in the wheelchair with all needs met and call bell within reach.     Therapy Documentation Precautions:  Precautions Precautions: Fall Precaution/Restrictions Comments: SBP <150, stitches out 11/26 Restrictions Weight Bearing Restrictions Per Provider Order: No  Therapy/Group: Individual Therapy  Nena VEAR Moats 04/27/2024, 10:44 AM

## 2024-04-27 NOTE — Progress Notes (Signed)
 PROGRESS NOTE   Subjective/Complaints: Bladder doing better. Feels that stools are getting a little loose (IBS). Had four bm's T4, T6 since 11pm last night  ROS: Patient denies fever, rash, sore throat, blurred vision, dizziness, nausea, vomiting,  cough, shortness of breath or chest pain, joint or back/neck pain, headache, or mood change.   Objective:   No results found. Recent Labs    04/24/24 1002 04/26/24 0446  WBC 11.9* 9.1  HGB 8.0* 8.7*  HCT 24.9* 26.2*  PLT 488* 541*   Recent Labs    04/26/24 0446  NA 139  K 3.8  CL 107  CO2 25  GLUCOSE 160*  BUN 17  CREATININE 0.50  CALCIUM  8.8*    Intake/Output Summary (Last 24 hours) at 04/27/2024 0959 Last data filed at 04/27/2024 0700 Gross per 24 hour  Intake 756 ml  Output --  Net 756 ml        Physical Exam: Vital Signs Blood pressure (!) 148/66, pulse 85, temperature 99.2 F (37.3 C), temperature source Oral, resp. rate 16, height 5' 2 (1.575 m), weight 65.1 kg, SpO2 98%.   Constitutional: No distress . Vital signs reviewed. HEENT: NCAT, EOMI, oral membranes moist Neck: supple Cardiovascular: RRR without murmur. No JVD    Respiratory/Chest: CTA Bilaterally without wheezes or rales. Normal effort    GI/Abdomen: BS +, non-tender, non-distended Ext: no clubbing, cyanosis, or edema Psych: pleasant and cooperative  Skin: No evidence of breakdown, no evidence of rash  Neuro:  Alert and oriented x 3. Normal insight and awareness. Normal language and speech. Cranial nerve exam unremarkable. MMT: BUE 4- to 4/5 prox to distal. BLE 3+/4- to 4/5 prox to distal. Sensory exam normal for light touch and pain in all 4 limbs. No limb ataxia or cerebellar signs. No abnormal tone appreciated.  Stable 11/28 Musculoskeletal: Full ROM, No pain with AROM or PROM in the neck, trunk, or extremities. Posture appropriate    Assessment/Plan: 1. Functional deficits which  require 3+ hours per day of interdisciplinary therapy in a comprehensive inpatient rehab setting. Physiatrist is providing close team supervision and 24 hour management of active medical problems listed below. Physiatrist and rehab team continue to assess barriers to discharge/monitor patient progress toward functional and medical goals  Care Tool:  Bathing    Body parts bathed by patient: Right arm, Left arm, Chest, Abdomen, Front perineal area, Buttocks, Face, Left upper leg, Right upper leg   Body parts bathed by helper: Right lower leg, Left lower leg     Bathing assist Assist Level: Minimal Assistance - Patient > 75%     Upper Body Dressing/Undressing Upper body dressing   What is the patient wearing?: Button up shirt    Upper body assist Assist Level: Set up assist    Lower Body Dressing/Undressing Lower body dressing      What is the patient wearing?: Underwear/pull up, Pants     Lower body assist Assist for lower body dressing: Supervision/Verbal cueing     Toileting Toileting    Toileting assist Assist for toileting: Supervision/Verbal cueing     Transfers Chair/bed transfer  Transfers assist     Chair/bed transfer assist level:  Minimal Assistance - Patient > 75%     Locomotion Ambulation   Ambulation assist      Assist level: Contact Guard/Touching assist Assistive device: Walker-rolling Max distance: 168 ft   Walk 10 feet activity   Assist     Assist level: Minimal Assistance - Patient > 75% Assistive device: No Device   Walk 50 feet activity   Assist    Assist level: Contact Guard/Touching assist Assistive device: Walker-rolling    Walk 150 feet activity   Assist    Assist level: Contact Guard/Touching assist Assistive device: Walker-rolling    Walk 10 feet on uneven surface  activity   Assist Walk 10 feet on uneven surfaces activity did not occur: Safety/medical concerns         Wheelchair     Assist Is  the patient using a wheelchair?: Yes Type of Wheelchair: Manual    Wheelchair assist level: Maximal Assistance - Patient 25 - 49% Max wheelchair distance: 250 ft    Wheelchair 50 feet with 2 turns activity    Assist        Assist Level: Maximal Assistance - Patient 25 - 49%   Wheelchair 150 feet activity     Assist      Assist Level: Maximal Assistance - Patient 25 - 49%   Blood pressure (!) 148/66, pulse 85, temperature 99.2 F (37.3 C), temperature source Oral, resp. rate 16, height 5' 2 (1.575 m), weight 65.1 kg, SpO2 98%.  Medical Problem List and Plan: 1. Functional deficits secondary to ground level fall with L SDH s/p L frontoparietal craniotomy 04/11/24 by Dr. Lanis:             -patient may  shower with shower cap             -ELOS/Goals: 11/30 sup/mod may be shorter than that            -Continue CIR therapies including PT, OT, and SLP     2.  Antithrombotics: -DVT/anticoagulation:  Mechanical: Sequential compression devices, below knee Bilateral lower extremities             -Also Lovenox  30mg  BID             -antiplatelet therapy: none   3. Pain Management: tylenol  1000mg  q6h scheduled. Tramadol  and robaxin  PRN. On chart review, pt may have previously been on Celebrex 200mg  BID-- hold for now.    4. Mood/Behavior/Sleep/hx of anxiety:             -antipsychotic agents: none -Melatonin 3mg  nightly for sleep, previously used Xanax 0.5mg  PRN   -is sleeping well -Continue Celexa  20mg  daily   5. Neuropsych/cognition: This patient is capable of making decisions on her own behalf.   6. Skin/Wound Care: wound care and routine skin care; staples out 11/26 or after   7. Fluids/Electrolytes/Nutrition: monitor I&O and routine labs, continue vitamins/supplements. Cortrak d/c'd 11/20:             -Diet changed to carb mod -Currently has glucerna shakes TID-- per notes, doesn't do well with Ensure  -po intake is solid   8. HTN/Afib with RVR: previously on  Bisoprolol -hydrochlorothiazide  2.5-6.25mg  ?0.5tab daily-- not on formulary and has now been switched. Off Cleviprex  since 11/20. Off Amio, back in NSR -Continue Amlodipine  10mg  daily, Carvedilol  25mg  BID, Hydralazine  50mg  TID, Losartan  100mg  daily -11/25 remains under control -explained to son that this is the regimen she would likely remain on at discharge.  BP trend reviewed and  she has fluctuating hypo and hypertension- continue above regimen    9. HLD: continue Rosuvastatin  10mg  nightly   10. DM2: previously on Metformin XR 750mg  daily, currently held, resume as appropriate. On SSI Moderate scale. Monitor CBGs.    CBG (last 3)  Recent Labs    04/26/24 1646 04/26/24 2046 04/27/24 0618  GLUCAP 136* 162* 138*    11/28 controlled 11. Constipation: continue Colace 100mg  BID and psyllium packets BID, Miralax  PRN, also has Sorbitol /Fleet's PRN.  -LBM 11/23 type 3 so monitor closely.   -stay with same meds for now  11/28 hold colace given loose stool 12. Vertigo: apparently has hx of this, may have caused the fall             -vestibular eval/rx             -continue Meclizine  12.5mg  TID PRN   13. AHRF/VDRF/pulm edema: intubated 11/10-11 then 11/16-17, treated with Unasyn  but d/c'd 11/19 after resp cx neg and hypoxia improved.             -continue Incentive Spiro   14. Urinary retention: foley 11/14-20, but voiding spontaneously since; chart review shows ?Flomax previously--  -voiding continently at present    15. ABLA Hgb 7-8, monitor closely, transfuse <7   11/24- 7.9, continue to follow, no gross blood loss  16. Leukocytosis: sx improving, wbc's down yesterday  -Klebsiella UTI sens to keflex --continue thru tomorrow night for 10 total doses   LOS: 5 days A FACE TO FACE EVALUATION WAS PERFORMED  Jeanne Ellison 04/27/2024, 9:59 AM

## 2024-04-28 ENCOUNTER — Other Ambulatory Visit (HOSPITAL_COMMUNITY): Payer: Self-pay

## 2024-04-28 DIAGNOSIS — S069XAD Unspecified intracranial injury with loss of consciousness status unknown, subsequent encounter: Secondary | ICD-10-CM | POA: Diagnosis not present

## 2024-04-28 DIAGNOSIS — R739 Hyperglycemia, unspecified: Secondary | ICD-10-CM | POA: Diagnosis not present

## 2024-04-28 DIAGNOSIS — I10 Essential (primary) hypertension: Secondary | ICD-10-CM | POA: Diagnosis not present

## 2024-04-28 LAB — GLUCOSE, CAPILLARY
Glucose-Capillary: 137 mg/dL — ABNORMAL HIGH (ref 70–99)
Glucose-Capillary: 122 mg/dL — ABNORMAL HIGH (ref 70–99)
Glucose-Capillary: 100 mg/dL — ABNORMAL HIGH (ref 70–99)
Glucose-Capillary: 116 mg/dL — ABNORMAL HIGH (ref 70–99)

## 2024-04-28 MED ADMIN — Hydralazine HCl Tab 50 MG: 50 mg | ORAL | NDC 23155083401

## 2024-04-28 MED ADMIN — Nutritional Supplement Liquid: 237 mL | ORAL | NDC 7007464928

## 2024-04-28 MED ADMIN — Rosuvastatin Calcium Tab 5 MG: 10 mg | ORAL | NDC 65862029390

## 2024-04-28 MED ADMIN — Cholecalciferol Tab 25 MCG (1000 Unit): 1000 [IU] | ORAL | NDC 2055503300

## 2024-04-28 MED ADMIN — Insulin Aspart Inj Soln 100 Unit/ML: 2 [IU] | SUBCUTANEOUS | NDC 73070010011

## 2024-04-28 MED ADMIN — Acetaminophen Tab 500 MG: 1000 mg | ORAL | NDC 50580045711

## 2024-04-28 MED ADMIN — THERA M PLUS PO TABS: 1 | ORAL | NDC 1650058694

## 2024-04-28 MED ADMIN — Thiamine Mononitrate Tab 100 MG: 100 mg | ORAL | NDC 54629005701

## 2024-04-28 MED ADMIN — Ascorbic Acid Tab 500 MG: 500 mg | ORAL | NDC 00904052361

## 2024-04-28 MED ADMIN — Carvedilol Tab 25 MG: 25 mg | ORAL | NDC 72888003701

## 2024-04-28 MED ADMIN — Losartan Potassium Tab 50 MG: 100 mg | ORAL | NDC 68180037709

## 2024-04-28 MED ADMIN — Enoxaparin Sodium Inj Soln Pref Syr 30 MG/0.3ML: 30 mg | SUBCUTANEOUS | NDC 71288043280

## 2024-04-28 MED ADMIN — Amlodipine Besylate Tab 10 MG (Base Equivalent): 10 mg | ORAL | NDC 00904637161

## 2024-04-28 MED ADMIN — Melatonin Tab 3 MG: 3 mg | ORAL | NDC 3160402741

## 2024-04-28 MED ADMIN — Citalopram Hydrobromide Tab 20 MG (Base Equiv): 20 mg | ORAL | NDC 00904608561

## 2024-04-28 MED ADMIN — Tramadol HCl Tab 50 MG: 50 mg | ORAL | NDC 60687079511

## 2024-04-28 NOTE — Progress Notes (Signed)
 Speech Language Pathology Discharge Summary  Patient Details  Name: TIMYA TRIMMER MRN: [id number] Date of Birth: [birth year]  Date of Discharge from SLP service:April 28, 2024   Patient has met 3 of 3 long term goals.  Patient to discharge at overall Supervision level.  Reasons goals not met: n/a   Clinical Impression/Discharge Summary:  Pt made excellent progress this stay, as evidenced by improved complex expressive/receptive language. She requires only supervisionA for comprehension and expression of complex thoughts/ideas. Pt/family education complete and are available to assist as needed. Recommend continued ST upon d/c to target remaining mild communication deficits, maximize pt independence, and facilitate return to prev roles/responsibilities.   Care Partner:  Caregiver Able to Provide Assistance: Yes  Type of Caregiver Assistance: Cognitive  Recommendation:  Outpatient SLP  Rationale for SLP Follow Up: Maximize functional communication;Reduce caregiver burden   Equipment: n/a   Reasons for discharge: Discharged from hospital   Patient/Family Agrees with Progress Made and Goals Achieved: Yes    [name] 04/28/2024, 8:48 PM

## 2024-04-28 NOTE — Progress Notes (Signed)
 Education provided to patient about stroke symptoms. Monitoring blood pressure at home.

## 2024-04-28 NOTE — Plan of Care (Signed)
  Problem: RH Balance Goal: LTG: Patient will maintain dynamic sitting balance (OT) Description: LTG:  Patient will maintain dynamic sitting balance with assistance during activities of daily living (OT) Outcome: Completed/Met Goal: LTG Patient will maintain dynamic standing with ADLs (OT) Description: LTG:  Patient will maintain dynamic standing balance with assist during activities of daily living (OT)  Outcome: Completed/Met   Problem: Sit to Stand Goal: LTG:  Patient will perform sit to stand in prep for activites of daily living with assistance level (OT) Description: LTG:  Patient will perform sit to stand in prep for activites of daily living with assistance level (OT) Outcome: Completed/Met   Problem: RH Grooming Goal: LTG Patient will perform grooming w/assist,cues/equip (OT) Description: LTG: Patient will perform grooming with assist, with/without cues using equipment (OT) Outcome: Completed/Met   Problem: RH Bathing Goal: LTG Patient will bathe all body parts with assist levels (OT) Description: LTG: Patient will bathe all body parts with assist levels (OT) Outcome: Completed/Met   Problem: RH Dressing Goal: LTG Patient will perform upper body dressing (OT) Description: LTG Patient will perform upper body dressing with assist, with/without cues (OT). Outcome: Completed/Met Goal: LTG Patient will perform lower body dressing w/assist (OT) Description: LTG: Patient will perform lower body dressing with assist, with/without cues in positioning using equipment (OT) Outcome: Completed/Met   Problem: RH Toileting Goal: LTG Patient will perform toileting task (3/3 steps) with assistance level (OT) Description: LTG: Patient will perform toileting task (3/3 steps) with assistance level (OT)  Outcome: Completed/Met   Problem: RH Simple Meal Prep Goal: LTG Patient will perform simple meal prep w/assist (OT) Description: LTG: Patient will perform simple meal prep with assistance,  with/without cues (OT). Outcome: Completed/Met   Problem: RH Laundry Goal: LTG Patient will perform laundry w/assist, cues (OT) Description: LTG: Patient will perform laundry with assistance, with/without cues (OT). Outcome: Completed/Met   Problem: RH Light Housekeeping Goal: LTG Patient will perform light housekeeping w/assist (OT) Description: LTG: Patient will perform light housekeeping with assistance, with/without cues (OT). Outcome: Completed/Met   Problem: RH Toilet Transfers Goal: LTG Patient will perform toilet transfers w/assist (OT) Description: LTG: Patient will perform toilet transfers with assist, with/without cues using equipment (OT) Outcome: Completed/Met   Problem: RH Tub/Shower Transfers Goal: LTG Patient will perform tub/shower transfers w/assist (OT) Description: LTG: Patient will perform tub/shower transfers with assist, with/without cues using equipment (OT) Outcome: Completed/Met   

## 2024-04-28 NOTE — Plan of Care (Signed)
  Problem: RH Comprehension Communication Goal: LTG Patient will comprehend basic/complex auditory (SLP) Description: LTG: Patient will comprehend basic/complex auditory information with cues (SLP). Outcome: Completed/Met   Problem: RH Expression Communication Goal: LTG Patient will verbally express basic/complex needs(SLP) Description: LTG:  Patient will verbally express basic/complex needs, wants or ideas with cues  (SLP) Outcome: Completed/Met Goal: LTG Patient will increase word finding of common (SLP) Description: LTG:  Patient will increase word finding of common objects/daily info/abstract thoughts with cues using compensatory strategies (SLP). Outcome: Completed/Met

## 2024-04-28 NOTE — Progress Notes (Signed)
 PROGRESS NOTE   Subjective/Complaints:  Pt doing well, slept ok, pain manageable, LBM last night and again after eval, urinating fine. No other complaints or concerns. Going home tomorrow.   ROS: as per HPI. Denies CP, SOB, abd pain, N/V/D/C, or any other complaints at this time.    Objective:   No results found. Recent Labs    04/26/24 0446  WBC 9.1  HGB 8.7*  HCT 26.2*  PLT 541*   Recent Labs    04/26/24 0446  NA 139  K 3.8  CL 107  CO2 25  GLUCOSE 160*  BUN 17  CREATININE 0.50  CALCIUM  8.8*    Intake/Output Summary (Last 24 hours) at 04/28/2024 1638 Last data filed at 04/28/2024 1326 Gross per 24 hour  Intake 480 ml  Output --  Net 480 ml        Physical Exam: Vital Signs Blood pressure (!) 140/42, pulse 79, temperature 98.6 F (37 C), temperature source Oral, resp. rate 16, height 5' 2 (1.575 m), weight 65.1 kg, SpO2 98%.   Constitutional: No distress . Vital signs reviewed. Resting comfortably in bed. HEENT: NCAT, EOMI, oral membranes moist Neck: supple Cardiovascular: RRR without murmur. No JVD    Respiratory/Chest: CTA Bilaterally without wheezes or rales. Normal effort    GI/Abdomen: BS +, non-tender, non-distended, soft Ext: no clubbing, cyanosis, or edema Psych: pleasant and cooperative  Skin: No evidence of breakdown, no evidence of rash  PRIOR EXAMS: Neuro:  Alert and oriented x 3. Normal insight and awareness. Normal language and speech. Cranial nerve exam unremarkable. MMT: BUE 4- to 4/5 prox to distal. BLE 3+/4- to 4/5 prox to distal. Sensory exam normal for light touch and pain in all 4 limbs. No limb ataxia or cerebellar signs. No abnormal tone appreciated.  Stable 11/28 Musculoskeletal: Full ROM, No pain with AROM or PROM in the neck, trunk, or extremities. Posture appropriate    Assessment/Plan: 1. Functional deficits which require 3+ hours per day of interdisciplinary therapy  in a comprehensive inpatient rehab setting. Physiatrist is providing close team supervision and 24 hour management of active medical problems listed below. Physiatrist and rehab team continue to assess barriers to discharge/monitor patient progress toward functional and medical goals  Care Tool:  Bathing    Body parts bathed by patient: Right arm, Left arm, Chest, Abdomen, Front perineal area, Buttocks, Face, Left upper leg, Right upper leg, Left lower leg, Right lower leg   Body parts bathed by helper: Right lower leg, Left lower leg     Bathing assist Assist Level: Independent with assistive device Assistive Device Comment: shower chair and LH sponge   Upper Body Dressing/Undressing Upper body dressing   What is the patient wearing?: Pull over shirt    Upper body assist Assist Level: Independent    Lower Body Dressing/Undressing Lower body dressing      What is the patient wearing?: Underwear/pull up, Pants     Lower body assist Assist for lower body dressing: Independent with assitive device     Toileting Toileting    Toileting assist Assist for toileting: Independent with assistive device     Transfers Chair/bed transfer  Transfers assist  Chair/bed transfer assist level: Independent with assistive device Chair/bed transfer assistive device: Geologist, Engineering   Ambulation assist      Assist level: Supervision/Verbal cueing Assistive device: Walker-rolling Max distance: 325'   Walk 10 feet activity   Assist     Assist level: Supervision/Verbal cueing Assistive device: Walker-rolling   Walk 50 feet activity   Assist    Assist level: Supervision/Verbal cueing Assistive device: Walker-rolling    Walk 150 feet activity   Assist    Assist level: Supervision/Verbal cueing Assistive device: Walker-rolling    Walk 10 feet on uneven surface  activity   Assist Walk 10 feet on uneven surfaces activity did not occur:  Safety/medical concerns   Assist level: Supervision/Verbal cueing Assistive device: Walker-rolling   Wheelchair     Assist Is the patient using a wheelchair?: No Type of Wheelchair: Manual    Wheelchair assist level: Maximal Assistance - Patient 25 - 49% Max wheelchair distance: 250 ft    Wheelchair 50 feet with 2 turns activity    Assist        Assist Level: Maximal Assistance - Patient 25 - 49%   Wheelchair 150 feet activity     Assist      Assist Level: Maximal Assistance - Patient 25 - 49%   Blood pressure (!) 140/42, pulse 79, temperature 98.6 F (37 C), temperature source Oral, resp. rate 16, height 5' 2 (1.575 m), weight 65.1 kg, SpO2 98%.  Medical Problem List and Plan: 1. Functional deficits secondary to ground level fall with L SDH s/p L frontoparietal craniotomy 04/11/24 by Dr. Lanis:             -patient may  shower with shower cap             -ELOS/Goals: 11/30 sup/mod may be shorter than that            -Continue CIR therapies including PT, OT, and SLP     2.  Antithrombotics: -DVT/anticoagulation:  Mechanical: Sequential compression devices, below knee Bilateral lower extremities             -Also Lovenox  30mg  BID             -antiplatelet therapy: none   3. Pain Management: tylenol  1000mg  q6h scheduled. Tramadol  and robaxin  PRN. On chart review, pt may have previously been on Celebrex 200mg  BID-- hold for now.    4. Mood/Behavior/Sleep/hx of anxiety:             -antipsychotic agents: none -Melatonin 3mg  nightly for sleep, previously used Xanax 0.5mg  PRN   -is sleeping well -Continue Celexa  20mg  daily   5. Neuropsych/cognition: This patient is capable of making decisions on her own behalf.   6. Skin/Wound Care: wound care and routine skin care; staples out 11/26 or after   7. Fluids/Electrolytes/Nutrition: monitor I&O and routine labs, continue vitamins/supplements. Cortrak d/c'd 11/20:             -Diet changed to carb  mod -Currently has glucerna shakes TID-- per notes, doesn't do well with Ensure  -po intake is solid   8. HTN/Afib with RVR: previously on Bisoprolol -hydrochlorothiazide  2.5-6.25mg  ?0.5tab daily-- not on formulary and has now been switched. Off Cleviprex  since 11/20. Off Amio, back in NSR -Continue Amlodipine  10mg  daily, Carvedilol  25mg  BID, Hydralazine  50mg  TID, Losartan  100mg  daily -11/25 remains under control -explained to son that this is the regimen she would likely remain on at discharge.  BP trend reviewed and she  has fluctuating hypo and hypertension- continue above regimen  -04/28/24 BP stable, monitor   9. HLD: continue Rosuvastatin  10mg  nightly   10. DM2: previously on Metformin XR 750mg  daily, currently held, resume as appropriate. On SSI Moderate scale. Monitor CBGs.    CBG (last 3)  Recent Labs    04/28/24 0619 04/28/24 1139 04/28/24 1621  GLUCAP 137* 122* 100*    11/28-29 controlled  11. Constipation: continue Colace 100mg  BID and psyllium packets BID, Miralax  PRN, also has Sorbitol /Fleet's PRN.  -LBM 11/23 type 3 so monitor closely.   -stay with same meds for now  11/28 hold colace given loose stool  -04/28/24 LBM today, monitor  12. Vertigo: apparently has hx of this, may have caused the fall             -vestibular eval/rx             -continue Meclizine  12.5mg  TID PRN   13. AHRF/VDRF/pulm edema: intubated 11/10-11 then 11/16-17, treated with Unasyn  but d/c'd 11/19 after resp cx neg and hypoxia improved.             -continue Incentive Spiro   14. Urinary retention: foley 11/14-20, but voiding spontaneously since; chart review shows ?Flomax previously--  -voiding continently at present    15. ABLA Hgb 7-8, monitor closely, transfuse <7   11/24- 7.9, continue to follow, no gross blood loss>> up to 8.7 11/27  16. Leukocytosis: sx improving, wbc's down yesterday  -Klebsiella UTI sens to keflex --continue thru tomorrow night for 10 total doses   LOS: 6  days A FACE TO FACE EVALUATION WAS PERFORMED  [address] 04/28/2024, 4:38 PM

## 2024-04-28 NOTE — Progress Notes (Signed)
 Occupational Therapy Session Note  Patient Details  Name: Jeanne Ellison MRN: [id number] Date of Birth: [birth year]  Today's Date: 04/28/2024 OT Individual Time: 8964-8884 OT Individual Time Calculation (min): 40 min    Short Term Goals: Week 1:  OT Short Term Goal 1 (Week 1): STGs = LTGs  Skilled Therapeutic Interventions/Progress Updates:  Pt greeted resting in bed, no reports of pain. LPN EPIC chats this OT prior to start of session to inquire about orthostatic vitals as a means of fall prevention upon discharge as patient concerned with symptomology throughout the day. Time dedicated to assessing, details below: Supine: BP=127/53 HR=74 Sitting EOB: BP=121/60  HR=77 Standing: BP=125/50   HR=85 Standing after ~3 mins: BP=117/56 HR=87  Pt educated on use of BLE TEDs for BP stabilization during day. Recommended use of bed rail as safety measure as patient fears she will fall out of bed. Room-level transfers with supervision + RW. Bathing/dressing in similar fashion with use of grab bar + TTB + long-handled shower head. Min cuing for safety precautions upon stepping out of walk-in shower. Handout provided for suction-cup grab bar & bed rail.   Pt remained in direct care of NT for toileting needs.   Therapy Documentation Precautions:  Precautions Precautions: Fall Precaution/Restrictions Comments: SBP <150, stitches out 11/26 Restrictions Weight Bearing Restrictions Per Provider Order: No   Therapy/Group: Individual Therapy  Nereida Habermann, OTR/L, MSOT  04/28/2024, 6:51 AM

## 2024-04-29 DIAGNOSIS — I10 Essential (primary) hypertension: Secondary | ICD-10-CM | POA: Diagnosis not present

## 2024-04-29 DIAGNOSIS — S069XAD Unspecified intracranial injury with loss of consciousness status unknown, subsequent encounter: Secondary | ICD-10-CM | POA: Diagnosis not present

## 2024-04-29 DIAGNOSIS — R739 Hyperglycemia, unspecified: Secondary | ICD-10-CM | POA: Diagnosis not present

## 2024-04-29 MED ADMIN — Hydralazine HCl Tab 50 MG: 50 mg | ORAL | NDC 23155083401

## 2024-04-29 MED ADMIN — Cholecalciferol Tab 25 MCG (1000 Unit): 1000 [IU] | ORAL | NDC 2055503300

## 2024-04-29 MED ADMIN — Insulin Aspart Inj Soln 100 Unit/ML: 2 [IU] | SUBCUTANEOUS | NDC 73070010011

## 2024-04-29 MED ADMIN — Acetaminophen Tab 500 MG: 1000 mg | ORAL | NDC 50580045711

## 2024-04-29 MED ADMIN — THERA M PLUS PO TABS: 1 | ORAL | NDC 1650058694

## 2024-04-29 MED ADMIN — Thiamine Mononitrate Tab 100 MG: 100 mg | ORAL | NDC 54629005701

## 2024-04-29 MED ADMIN — Ascorbic Acid Tab 500 MG: 500 mg | ORAL | NDC 00904052361

## 2024-04-29 MED ADMIN — Carvedilol Tab 25 MG: 25 mg | ORAL | NDC 72888003701

## 2024-04-29 MED ADMIN — Losartan Potassium Tab 50 MG: 100 mg | ORAL | NDC 68180037709

## 2024-04-29 MED ADMIN — Enoxaparin Sodium Inj Soln Pref Syr 30 MG/0.3ML: 30 mg | SUBCUTANEOUS | NDC 71288043280

## 2024-04-29 MED ADMIN — Amlodipine Besylate Tab 10 MG (Base Equivalent): 10 mg | ORAL | NDC 68382012316

## 2024-04-29 MED ADMIN — Citalopram Hydrobromide Tab 20 MG (Base Equiv): 20 mg | ORAL | NDC 00904608561

## 2024-04-30 ENCOUNTER — Encounter: Payer: Self-pay | Admitting: Cardiology

## 2024-04-30 NOTE — Discharge Summary (Signed)
 [facility] Discharge Summary   Patient ID: Jeanne Ellison MRN: [id number] DOB/AGE: 1940/11/20 83 y.o.  Admit date: 04/09/2024 Discharge date: 04/22/2024  Admitting Diagnosis: Fall Scalp hematoma Subdural hematoma  Discharge Diagnosis Patient Active Problem List   Diagnosis Date Noted   TBI (traumatic brain injury) (HCC) 04/22/2024   Acute respiratory failure with hypoxia (HCC) 04/15/2024   Atrial fibrillation with rapid ventricular response (HCC) 04/15/2024   Malnutrition of moderate degree 04/11/2024   SDH (subdural hematoma) (HCC) 04/09/2024   Coronary artery disease involving native coronary artery of native heart without angina pectoris 09/04/2020   Type 2 diabetes mellitus with hyperlipidemia (HCC)    Osteoarthritis    Hypertension    Hyperlipidemia    Generalized anxiety disorder    Acid reflux disease    Type 2 diabetes mellitus without complication, without long-term current use of insulin  (HCC) 04/11/2018   Chest pain syndrome 04/11/2018    Consultants Neurosurgery   Imaging: No results found.  Procedures Dr. Lanis 04/11/14 - Left temporo-parietal craniotomy for evacuation of subdural hematoma   HPI: Jeanne Ellison is an 83 y/o F who presented as a level 1 trauma. History is provided by EMS. Patient reportedly found down at home, last known normal 8:04 AM. Per EMS she was GCS 10 en route, making sounds but not forming words, swinging all extremities. Hypertensive. Per EMS she is A&Ox4 at baseline and does not take blood thinners. She was agitated in the ED and was intubated for airway protection and further workup.    Husband is en route to ED.  Hospital Course:  Traumatic injuries and treatment as below: 83 y/o F GLF R posterior scalp hematoma L subdural hematoma with 3mm midline shift - per Dr. Lanis, keppra , F/U [facility] stable 11/10, neuro status change 11/12>>  repeat CTH with increased left SDH with increased midline shift and  early uncal herniation. Patient moved to ICU for monitoring, Dr. Lanis notified and patient underwent L temporo-parietal craniotomy 11/12. On nicardipine  gtt for BP control. Follow up CTH was stable, EEG negative. Exam much better. TBI team therapies. Acute hypoxic respiratory failure - extubated 11/11. Reintubated 11/16 for hypoxia on NRB.  on empiric unasyn  11/16 for possible aspiration. Resp cx with NRF, dcd unasyn  Afib/RVR - back in sinus, echo done HTN/freq PVCs - added Coreg  and losartan  11/18,  increased coreg  to 25BID and added norvasc  11/19, add scheduled hydralazine  11/20, cleviprex  off and moved from ICU to progressive care 11/20 HLD DM - SSI R hand swelling - no fx on xray FEN - reg diet; discontinue scheduled MiraLAX  and continue daily fiber VTE - SCD's, LMWH ID - none Foley- foley removed 11/20, spontaneous voids Dispo - 4NP, medically stable for CIR I added vesibular eval due to history of vertigo. Added PRN meclizine . Resume melatonin for sleep. Previously taking xanax at bedtime as well which I will hold for now.   On 11/23 the patient was stable for [facility].   Allergies as of 04/22/2024       Reactions   Sulfa Antibiotics    Causes sneezing and hay fever   Sulfinpyrazone    Vytorin [ezetimibe-simvastatin]    BODY ACHE        Medication List     ASK your doctor about these medications    ALPRAZolam 1 MG tablet Wait to take this until your doctor or other care provider tells you to start again. Commonly known as: XANAX Take 0.5-1 mg by  mouth 2 (two) times daily as needed.   citalopram  20 MG tablet Commonly known as: CELEXA  Take 20 mg by mouth daily.   FOLIC ACID PO Take 1-2 tablets by mouth daily.   metFORMIN 750 MG 24 hr tablet Commonly known as: GLUCOPHAGE-XR Take 750-1,500 mg by mouth daily.   QC TUMERIC COMPLEX PO Take 1 tablet by mouth daily.   VITAMIN C  PO Take 2 tablets by mouth daily.    VITAMIN D3 PO Take 1-2 tablets by mouth daily.   VITAMIN E PO Take 1 tablet by mouth daily.          Follow-up Information     Lanis Pupa, MD Follow up.   Specialty: Neurosurgery Contact information: [address] [place] [address] [phone]                 Signed: Almarie Pringle, [facility] 04/30/2024, 1:47 PM

## 2024-05-01 NOTE — Progress Notes (Signed)
 Inpatient Rehabilitation Care Coordinator Discharge Note   Patient Details  Name: Jeanne Ellison MRN: [id number] Date of Birth: 04-30-1941   Discharge location: D/c to home  Length of Stay: 6 days  Discharge activity level: Supervision  Home/community participation: limited  Patient response un:Yzjouy Literacy - How often do you need to have someone help you when you read instructions, pamphlets, or other written material from your doctor or pharmacy?: Never  Patient response un:Dnrpjo Isolation - How often do you feel lonely or isolated from those around you?: Rarely  Services provided included: MD, RD, PT, OT, SLP, RN, CM, TR, Pharmacy, Neuropsych, SW  Financial Services:  Field Seismologist Utilized: Private Insurance [employer]  Choices offered to/list presented to: patient husband  Follow-up services arranged:  Outpatient    Outpatient Servicies: [facility] for PT/OT/SLP      Patient response to transportation need: Is the patient able to respond to transportation needs?: Yes In the past 12 months, has lack of transportation kept you from medical appointments or from getting medications?: No In the past 12 months, has lack of transportation kept you from meetings, work, or from getting things needed for daily living?: No   Patient/Family verbalized understanding of follow-up arrangements:  Yes  Individual responsible for coordination of the follow-up plan: contact pt husband  Confirmed correct DME delivered: Jeanne Ellison 05/01/2024    Comments (or additional information):fam edu completed  Summary of Stay    Date/Time Discharge Planning CSW  04/24/24 1001 Pt will dischagre to home with her husband and PRN support from dtr and son. SW will confirm there are no barriers to discharge. AAC       Jeanne Ellison

## 2024-05-07 ENCOUNTER — Telehealth: Payer: Self-pay

## 2024-05-07 DIAGNOSIS — E1169 Type 2 diabetes mellitus with other specified complication: Secondary | ICD-10-CM | POA: Diagnosis not present

## 2024-05-07 DIAGNOSIS — Z8679 Personal history of other diseases of the circulatory system: Secondary | ICD-10-CM | POA: Diagnosis not present

## 2024-05-07 DIAGNOSIS — S069X9S Unspecified intracranial injury with loss of consciousness of unspecified duration, sequela: Secondary | ICD-10-CM

## 2024-05-07 DIAGNOSIS — Z6828 Body mass index (BMI) 28.0-28.9, adult: Secondary | ICD-10-CM | POA: Diagnosis not present

## 2024-05-07 DIAGNOSIS — Z87828 Personal history of other (healed) physical injury and trauma: Secondary | ICD-10-CM | POA: Diagnosis not present

## 2024-05-07 DIAGNOSIS — S065XAA Traumatic subdural hemorrhage with loss of consciousness status unknown, initial encounter: Secondary | ICD-10-CM

## 2024-05-07 NOTE — Telephone Encounter (Signed)
 Christopher son of patient called stating that [facility] Oupt therapy for ST is Dec. 15 and PT/OT starts Jan. 8th. He states that she needs PT/OT before then because the family can not get her motivated to do anything and also is not sure how far to push her. He states that they are willing to come to [facility] if she can get in sooner.
